# Patient Record
Sex: Male | Born: 1960
Health system: Southern US, Community
[De-identification: ages and names within clinical notes are randomized; demographics above are authoritative.]

## PROBLEM LIST (undated history)

## (undated) DIAGNOSIS — E785 Hyperlipidemia, unspecified: Secondary | ICD-10-CM

## (undated) DIAGNOSIS — M1990 Unspecified osteoarthritis, unspecified site: Secondary | ICD-10-CM

## (undated) DIAGNOSIS — I2510 Atherosclerotic heart disease of native coronary artery without angina pectoris: Secondary | ICD-10-CM

## (undated) DIAGNOSIS — I10 Essential (primary) hypertension: Secondary | ICD-10-CM

## (undated) DIAGNOSIS — Z95820 Peripheral vascular angioplasty status with implants and grafts: Secondary | ICD-10-CM

## (undated) DIAGNOSIS — I255 Ischemic cardiomyopathy: Secondary | ICD-10-CM

## (undated) HISTORY — PX: KNEE SURGERY: SHX244

## (undated) HISTORY — PX: OTHER SURGICAL HISTORY: SHX169

---

## 2015-04-07 ENCOUNTER — Encounter (HOSPITAL_COMMUNITY)
Admission: EM | Disposition: A | Discharge status: Home / Self Care | Payer: Self-pay | Source: Home / Self Care | Attending: Interventional Cardiology

## 2015-04-07 ENCOUNTER — Emergency Department (HOSPITAL_COMMUNITY): Payer: 59

## 2015-04-07 ENCOUNTER — Encounter (HOSPITAL_COMMUNITY): Payer: Self-pay | Admitting: Emergency Medicine

## 2015-04-07 ENCOUNTER — Inpatient Hospital Stay (HOSPITAL_COMMUNITY)
Admission: EM | Admit: 2015-04-07 | Discharge: 2015-04-10 | DRG: 246 | Disposition: A | Discharge status: Home / Self Care | Payer: 59 | Attending: Interventional Cardiology | Admitting: Interventional Cardiology

## 2015-04-07 DIAGNOSIS — I10 Essential (primary) hypertension: Secondary | ICD-10-CM | POA: Diagnosis present

## 2015-04-07 DIAGNOSIS — I5021 Acute systolic (congestive) heart failure: Secondary | ICD-10-CM | POA: Diagnosis present

## 2015-04-07 DIAGNOSIS — I214 Non-ST elevation (NSTEMI) myocardial infarction: Secondary | ICD-10-CM | POA: Diagnosis present

## 2015-04-07 DIAGNOSIS — I249 Acute ischemic heart disease, unspecified: Secondary | ICD-10-CM | POA: Diagnosis present

## 2015-04-07 DIAGNOSIS — I255 Ischemic cardiomyopathy: Secondary | ICD-10-CM | POA: Diagnosis present

## 2015-04-07 DIAGNOSIS — N289 Disorder of kidney and ureter, unspecified: Secondary | ICD-10-CM | POA: Diagnosis present

## 2015-04-07 DIAGNOSIS — E785 Hyperlipidemia, unspecified: Secondary | ICD-10-CM | POA: Diagnosis present

## 2015-04-07 DIAGNOSIS — I472 Ventricular tachycardia: Secondary | ICD-10-CM | POA: Diagnosis present

## 2015-04-07 DIAGNOSIS — I25110 Atherosclerotic heart disease of native coronary artery with unstable angina pectoris: Secondary | ICD-10-CM | POA: Diagnosis present

## 2015-04-07 DIAGNOSIS — I2510 Atherosclerotic heart disease of native coronary artery without angina pectoris: Secondary | ICD-10-CM | POA: Diagnosis not present

## 2015-04-07 DIAGNOSIS — K219 Gastro-esophageal reflux disease without esophagitis: Secondary | ICD-10-CM | POA: Diagnosis present

## 2015-04-07 DIAGNOSIS — R079 Chest pain, unspecified: Secondary | ICD-10-CM

## 2015-04-07 DIAGNOSIS — Z95820 Peripheral vascular angioplasty status with implants and grafts: Secondary | ICD-10-CM

## 2015-04-07 HISTORY — DX: Essential (primary) hypertension: I10

## 2015-04-07 HISTORY — DX: Hyperlipidemia, unspecified: E78.5

## 2015-04-07 HISTORY — DX: Ischemic cardiomyopathy: I25.5

## 2015-04-07 HISTORY — DX: Peripheral vascular angioplasty status with implants and grafts: Z95.820

## 2015-04-07 HISTORY — DX: Atherosclerotic heart disease of native coronary artery without angina pectoris: I25.10

## 2015-04-07 LAB — PROTIME-INR
INR: 0.95 (ref 0.00–1.49)
PROTHROMBIN TIME: 12.9 s (ref 11.6–15.2)

## 2015-04-07 LAB — I-STAT TROPONIN, ED: Troponin i, poc: 0.1 ng/mL (ref 0.00–0.08)

## 2015-04-07 LAB — CBC
HEMATOCRIT: 54.2 % — AB (ref 39.0–52.0)
HEMOGLOBIN: 18.5 g/dL — AB (ref 13.0–17.0)
MCH: 32.1 pg (ref 26.0–34.0)
MCHC: 34.1 g/dL (ref 30.0–36.0)
MCV: 93.9 fL (ref 78.0–100.0)
Platelets: 231 10*3/uL (ref 150–400)
RBC: 5.77 MIL/uL (ref 4.22–5.81)
RDW: 13.4 % (ref 11.5–15.5)
WBC: 15.6 10*3/uL — AB (ref 4.0–10.5)

## 2015-04-07 LAB — BASIC METABOLIC PANEL
Anion gap: 10 (ref 5–15)
BUN: 25 mg/dL — ABNORMAL HIGH (ref 6–20)
CHLORIDE: 104 mmol/L (ref 101–111)
CO2: 26 mmol/L (ref 22–32)
CREATININE: 1.64 mg/dL — AB (ref 0.61–1.24)
Calcium: 9.3 mg/dL (ref 8.9–10.3)
GFR calc non Af Amer: 45 mL/min — ABNORMAL LOW (ref >60)
GFR, EST AFRICAN AMERICAN: 52 mL/min — AB (ref >60)
Glucose, Bld: 104 mg/dL — ABNORMAL HIGH (ref 65–99)
POTASSIUM: 4.2 mmol/L (ref 3.5–5.1)
SODIUM: 140 mmol/L (ref 135–145)

## 2015-04-07 LAB — TROPONIN I: TROPONIN I: 0.12 ng/mL — AB (ref <0.031)

## 2015-04-07 SURGERY — INVASIVE LAB ABORTED CASE

## 2015-04-07 MED ORDER — ONDANSETRON HCL 4 MG/2ML IJ SOLN: 4.0000 mg | Freq: Four times a day (QID) | INTRAMUSCULAR | Status: DC | PRN

## 2015-04-07 MED ORDER — HEPARIN SODIUM (PORCINE) 5000 UNIT/ML IJ SOLN
INTRAMUSCULAR | Status: AC
  Administered 2015-04-07: 4000 [IU]
  Filled 2015-04-07: qty 1

## 2015-04-07 MED ORDER — SODIUM CHLORIDE 0.9 % IJ SOLN
3.0000 mL | Freq: Two times a day (BID) | INTRAMUSCULAR | Status: DC
  Administered 2015-04-07 – 2015-04-08 (×2): 3 mL via INTRAVENOUS

## 2015-04-07 MED ORDER — SODIUM CHLORIDE 0.9 % WEIGHT BASED INFUSION
1.0000 mL/kg/h | INTRAVENOUS | Status: DC
  Administered 2015-04-07 – 2015-04-08 (×2): 1 mL/kg/h via INTRAVENOUS

## 2015-04-07 MED ORDER — ACETAMINOPHEN 325 MG PO TABS
650.0000 mg | ORAL_TABLET | ORAL | Status: DC | PRN
  Administered 2015-04-07 – 2015-04-08 (×2): 650 mg via ORAL
  Filled 2015-04-07 (×2): qty 2

## 2015-04-07 MED ORDER — ASPIRIN EC 81 MG PO TBEC
81.0000 mg | DELAYED_RELEASE_TABLET | Freq: Every day | ORAL | Status: DC
  Administered 2015-04-08: 81 mg via ORAL
  Filled 2015-04-07: qty 1

## 2015-04-07 MED ORDER — SODIUM CHLORIDE 0.9 % IJ SOLN: 3.0000 mL | INTRAMUSCULAR | Status: DC | PRN

## 2015-04-07 MED ORDER — HEPARIN (PORCINE) IN NACL 100-0.45 UNIT/ML-% IJ SOLN
1600.0000 [IU]/h | INTRAMUSCULAR | Status: DC
  Administered 2015-04-07: 1250 [IU]/h via INTRAVENOUS
  Filled 2015-04-07: qty 250

## 2015-04-07 MED ORDER — ASPIRIN 300 MG RE SUPP: 300.0000 mg | RECTAL | Status: DC

## 2015-04-07 MED ORDER — METOPROLOL TARTRATE 25 MG PO TABS
25.0000 mg | ORAL_TABLET | Freq: Two times a day (BID) | ORAL | Status: DC
  Administered 2015-04-07 – 2015-04-08 (×2): 25 mg via ORAL
  Filled 2015-04-07 (×2): qty 1

## 2015-04-07 MED ORDER — ASPIRIN 81 MG PO CHEW: 324.0000 mg | CHEWABLE_TABLET | ORAL | Status: DC

## 2015-04-07 MED ORDER — ATORVASTATIN CALCIUM 80 MG PO TABS: 80.0000 mg | ORAL_TABLET | Freq: Every day | ORAL | Status: DC

## 2015-04-07 MED ORDER — CLOPIDOGREL BISULFATE 75 MG PO TABS
75.0000 mg | ORAL_TABLET | Freq: Every day | ORAL | Status: DC
  Administered 2015-04-08: 75 mg via ORAL
  Filled 2015-04-07: qty 1

## 2015-04-07 MED ORDER — SODIUM CHLORIDE 0.9 % IV SOLN: 250.0000 mL | INTRAVENOUS | Status: DC | PRN

## 2015-04-07 MED ORDER — NITROGLYCERIN 0.4 MG SL SUBL: 0.4000 mg | SUBLINGUAL_TABLET | SUBLINGUAL | Status: DC | PRN

## 2015-04-07 MED ORDER — NITROGLYCERIN IN D5W 200-5 MCG/ML-% IV SOLN
10.0000 ug/min | INTRAVENOUS | Status: DC
  Administered 2015-04-07: 10 ug/min via INTRAVENOUS
  Administered 2015-04-08: 25 ug/min via INTRAVENOUS
  Filled 2015-04-07 (×2): qty 250

## 2015-04-07 MED ORDER — CLOPIDOGREL BISULFATE 75 MG PO TABS
300.0000 mg | ORAL_TABLET | Freq: Once | ORAL | Status: AC
  Administered 2015-04-07: 300 mg via ORAL
  Filled 2015-04-07: qty 4

## 2015-04-07 MED ORDER — ASPIRIN 81 MG PO CHEW
81.0000 mg | CHEWABLE_TABLET | ORAL | Status: AC
  Administered 2015-04-08: 81 mg via ORAL
  Filled 2015-04-07: qty 1

## 2015-04-07 SURGICAL SUPPLY — 12 items
CATH INFINITI 5 FR JL3.5 (CATHETERS) ×3 IMPLANT
CATH INFINITI JR4 5F (CATHETERS) ×3 IMPLANT
CATH SITESEER 5F MULTI A 2 (CATHETERS) IMPLANT
DEVICE RAD COMP TR BAND LRG (VASCULAR PRODUCTS) ×3 IMPLANT
GLIDESHEATH SLEND A-KIT 6F 22G (SHEATH) ×3 IMPLANT
KIT HEART LEFT (KITS) ×3 IMPLANT
PACK CARDIAC CATHETERIZATION (CUSTOM PROCEDURE TRAY) ×3 IMPLANT
SHEATH PINNACLE 5F 10CM (SHEATH) IMPLANT
TRANSDUCER W/STOPCOCK (MISCELLANEOUS) ×3 IMPLANT
TUBING CIL FLEX 10 FLL-RA (TUBING) ×3 IMPLANT
WIRE EMERALD 3MM-J .035X150CM (WIRE) IMPLANT
WIRE SAFE-T 1.5MM-J .035X260CM (WIRE) ×3 IMPLANT

## 2015-04-07 NOTE — Progress Notes (Signed)
04/07/15 1930  Clinical Encounter Type  Visited With Family;Patient not available;Health care provider  Visit Type Initial;Spiritual support;ED  Referral From Nurse  Spiritual Encounters  Spiritual Needs Prayer;Emotional  Stress Factors  Patient Stress Factors None identified  Family Stress Factors Family relationships;Other (Comment) (Code Stemi)  Advance Directives (For Healthcare)  Does patient have an advance directive? No  Chaplain responded to a Code-Stemi and met pt Sister Freda Munro) and Pt Wife (Tammy). Offered hospitality and comfort support while pt was being evaluated. Escorted family to pt side when approved by the nurses and after Code-Stemi was cancelled.   Chaplain is available for further support if desired.  [employer], Chaplain

## 2015-04-07 NOTE — H&P (Addendum)
David Chang is a 54 y.o. male  Admit Date: 04/07/2015 Referring Physician: [facility] Primary care: Maryella Shivers M.D. [place] Primary Cardiologist: Vic Blackbird, M.D. 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 The intensity of the discomfort was graded as a 3/10. The discomfort started after he began taking a shower at home. The discomfort increased in intensity and radiated to the left arm. Total duration of the discomfort was 30 minutes or less starting around 6:10 PM and resolving by 6:40 PM. EMS was summoned. Initial transmitted EKG revealed diagnostic inferior ST elevation with reciprocal ST depression in 1 and aVL. Upon arrival in the emergency room nearly one hour later the patient was completely pain free. He denied dyspnea, diaphoresis, nausea, vomiting, and apprehension. He was jovial and able to carry on a very engaging conversation.  There is no prior history of smoking. He may drink 12 cans of beer over the weekend. He is an avid football fan. His exercise regimen is quite vigorous with 30 minutes of cardio and 1.5 hours of isometric activity 3-4 times per week. Today's workout was no different than usual. He did spend nearly 20 minutes in a steam room and was sweating profusely during this time.    PMH:    Past Medical History  Diagnosis Date  . Hypertension     PSH:    Past Surgical History  Procedure Laterality Date  . Knee surgery Left    ALLERGIES:   Review of patient's allergies indicates no known allergies. Prior to Admit Meds:   (Not in a hospital admission) Family HX:    Family History  Problem Relation Age of Onset  . CVA Other    Social HX:    Social History   Social History  . Marital Status: N/A    Spouse  Name: N/A  . Number of Children: N/A  . Years of Education: N/A   Occupational History  . Not on file.   Social History Main Topics  . Smoking status: Never Smoker   . Smokeless tobacco: Not on file  . Alcohol Use: Yes  . Drug Use: No  . Sexual Activity: Yes   Other Topics Concern  . Not on file   Social History Narrative  . No narrative on file     Review of Systems  Constitutional: Negative.   HENT: Negative.   Eyes: Negative.   Gastrointestinal: Negative.   Genitourinary: Negative.   Musculoskeletal: Positive for joint pain.  Skin: Negative.   Neurological: Negative.   Endo/Heme/Allergies: Negative.   Psychiatric/Behavioral: Negative.     Physical Exam: Blood pressure 168/98, pulse 98, temperature 98.7 F (37.1 C), temperature source Oral, resp. rate 14, height 6' (1.829 m), weight 104.327 kg (230 lb), SpO2 93 %.    Significant tanning of the skin. Patient is in no distress. Respirations are nonlabored. HEENT exam reveals pupils are equal and reactive to light. No jaundice is noted. Neck exam reveals no JVD or carotid bruits. Lungs are clear to auscultation and percussion. Cardiac exam reveals no gallop, rub, murmur, or click. Abdomen soft. Bowel sounds are normal. No bruits are heard. Aorta is not pulsatile. Extremities reveal no edema. Dorsalis pedis, posterior tibial, radial pulses, and carotid pulses all 2+ and symmetric bilaterally Neurological exam reveals no focal  deficits or cognitive impairment.  Labs: Pending    Radiology:  Pending  EKG:   #1 by EMS reveals 1 mm ST elevation 2, 3, and aVF. Minimal ST depression in aVL. Otherwise unremarkable. (Transmitted EKG from [place]). #2 obtained upon arrival reveals non-diagnostic inferior ST elevation improved from prior. Sinus rhythm and prominent voltage was noted.  ASSESSMENT:  1. Presentation is compatible with acute coronary syndrome. He is currently pain-free and but has mild EKG evidence of  ongoing ischemia. Total duration of pain less than 30 minutes, assuming accurate and truthful history. Initially refuses cath.  2. History of hypertension  3. Lipid status and glycemic status are unknown  4. Prior neurological event 7 years ago associated with transient left sided weakness.  Plan:  1. PA and lateral chest x-ray 2. IV heparin and load with Plavix 3. Aspirin 4. Beta blocker therapy 5. IV nitroglycerin 6. The patient was counseled to undergo left heart catheterization, coronary angiography, and possible percutaneous coronary intervention with stent implantation. The procedural risks and benefits were discussed in detail. The risks discussed included death, stroke, myocardial infarction, life-threatening bleeding, limb ischemia, kidney injury, allergy, and possible emergency cardiac surgery. The risk of these complications is less than 1%.   Initially, the patient refused coronary angiography, as he was doubtful there was a problem.  After discussion and 30 minutes later after the arrival of his wife, he agreed to proceed. We will plan on performing the procedure in a.m. unless recurrent symptoms this evening.  7. The patient is cautioned to notify the nursing service if any recurrence of discomfort in his chest.  Sinclair Grooms 04/07/2015 8:02 PM

## 2015-04-07 NOTE — Progress Notes (Signed)
ANTICOAGULATION CONSULT NOTE - Initial Consult  Pharmacy Consult for heparin Indication: chest pain/ACS  No Known Allergies  Patient Measurements: Height: 6' (182.9 cm) Weight: 230 lb (104.327 kg) IBW/kg (Calculated) : 77.6 Heparin Dosing Weight: 99.2 kg  Vital Signs: Temp: 98.7 F (37.1 C) (11/16 1936) Temp Source: Oral (11/16 1936) BP: 168/98 mmHg (11/16 1936) Pulse Rate: 98 (11/16 1936)  Labs:  Recent Labs  04/07/15 1925  HGB 18.5*  HCT 54.2*  PLT 231    CrCl cannot be calculated (Patient has no serum creatinine result on file.).   Medical History: Past Medical History  Diagnosis Date  . Hypertension     Assessment: 54 yo m presenting to the ED on 11/16 with CP. Pharmacy is consulted to dose heparin. Patient has already received a 4,000 unit bolus. Hgb 18.5, plts 231. He is not on any anticoagulation at home.   Goal of Therapy:  Heparin level 0.3-0.7 units/ml Monitor platelets by anticoagulation protocol: Yes   Plan:  Heparin infusion 1250 units/hr 6-hr HL @ 0230 Daily HL, CBC Monitor s/sx of bleeding  Cassie L. Nicole Kindred, PharmD Clinical Pharmacy Resident Pager: [phone] 04/07/2015 8:23 PM

## 2015-04-07 NOTE — ED Notes (Signed)
Pt. arrived with EMS [place])  from an urgent care reports central chest discomfort radiating to left arm onset this evening approx. 6 pm , received ASA 325 mg and 4 NTG sl by EMS with mild relief.

## 2015-04-08 ENCOUNTER — Inpatient Hospital Stay (HOSPITAL_COMMUNITY): Payer: 59

## 2015-04-08 ENCOUNTER — Encounter (HOSPITAL_COMMUNITY)
Admission: EM | Disposition: A | Discharge status: Home / Self Care | Payer: Self-pay | Source: Home / Self Care | Attending: Interventional Cardiology

## 2015-04-08 ENCOUNTER — Encounter (HOSPITAL_COMMUNITY): Payer: Self-pay | Admitting: Certified Registered Nurse Anesthetist

## 2015-04-08 DIAGNOSIS — I25110 Atherosclerotic heart disease of native coronary artery with unstable angina pectoris: Secondary | ICD-10-CM | POA: Diagnosis present

## 2015-04-08 DIAGNOSIS — I2510 Atherosclerotic heart disease of native coronary artery without angina pectoris: Secondary | ICD-10-CM

## 2015-04-08 DIAGNOSIS — I214 Non-ST elevation (NSTEMI) myocardial infarction: Secondary | ICD-10-CM | POA: Diagnosis present

## 2015-04-08 HISTORY — PX: CARDIAC CATHETERIZATION: SHX172

## 2015-04-08 HISTORY — DX: Atherosclerotic heart disease of native coronary artery without angina pectoris: I25.10

## 2015-04-08 HISTORY — PX: CORONARY STENT INTERVENTION: CATH118234

## 2015-04-08 LAB — LIPID PANEL
CHOL/HDL RATIO: 10.9 ratio
CHOLESTEROL: 251 mg/dL — AB (ref 0–200)
HDL: 23 mg/dL — ABNORMAL LOW (ref >40)
LDL Cholesterol: 203 mg/dL — ABNORMAL HIGH (ref 0–99)
Triglycerides: 124 mg/dL (ref <150)
VLDL: 25 mg/dL (ref 0–40)

## 2015-04-08 LAB — COMPREHENSIVE METABOLIC PANEL
ALBUMIN: 3.1 g/dL — AB (ref 3.5–5.0)
ALK PHOS: 30 U/L — AB (ref 38–126)
ALT: 45 U/L (ref 17–63)
ANION GAP: 9 (ref 5–15)
AST: 133 U/L — ABNORMAL HIGH (ref 15–41)
BUN: 23 mg/dL — ABNORMAL HIGH (ref 6–20)
CALCIUM: 8.8 mg/dL — AB (ref 8.9–10.3)
CO2: 24 mmol/L (ref 22–32)
Chloride: 104 mmol/L (ref 101–111)
Creatinine, Ser: 1.44 mg/dL — ABNORMAL HIGH (ref 0.61–1.24)
GFR calc Af Amer: >60 mL/min (ref >60)
GFR calc non Af Amer: 53 mL/min — ABNORMAL LOW (ref >60)
GLUCOSE: 154 mg/dL — AB (ref 65–99)
Potassium: 3.8 mmol/L (ref 3.5–5.1)
SODIUM: 137 mmol/L (ref 135–145)
Total Bilirubin: 0.6 mg/dL (ref 0.3–1.2)
Total Protein: 5.8 g/dL — ABNORMAL LOW (ref 6.5–8.1)

## 2015-04-08 LAB — MRSA PCR SCREENING: MRSA BY PCR: NEGATIVE

## 2015-04-08 LAB — CBC WITH DIFFERENTIAL/PLATELET
BASOS ABS: 0 10*3/uL (ref 0.0–0.1)
BASOS PCT: 0 %
Eosinophils Absolute: 0.2 10*3/uL (ref 0.0–0.7)
Eosinophils Relative: 1 %
HEMATOCRIT: 50 % (ref 39.0–52.0)
HEMOGLOBIN: 17.2 g/dL — AB (ref 13.0–17.0)
Lymphocytes Relative: 8 %
Lymphs Abs: 1.1 10*3/uL (ref 0.7–4.0)
MCH: 32 pg (ref 26.0–34.0)
MCHC: 34.4 g/dL (ref 30.0–36.0)
MCV: 92.9 fL (ref 78.0–100.0)
MONOS PCT: 6 %
Monocytes Absolute: 0.8 10*3/uL (ref 0.1–1.0)
NEUTROS ABS: 11.6 10*3/uL — AB (ref 1.7–7.7)
NEUTROS PCT: 85 %
Platelets: 204 10*3/uL (ref 150–400)
RBC: 5.38 MIL/uL (ref 4.22–5.81)
RDW: 13.3 % (ref 11.5–15.5)
WBC: 13.7 10*3/uL — ABNORMAL HIGH (ref 4.0–10.5)

## 2015-04-08 LAB — HEPARIN LEVEL (UNFRACTIONATED)
HEPARIN UNFRACTIONATED: 0.13 [IU]/mL — AB (ref 0.30–0.70)
Heparin Unfractionated: 0.4 IU/mL (ref 0.30–0.70)

## 2015-04-08 LAB — POCT ACTIVATED CLOTTING TIME: Activated Clotting Time: 417 seconds

## 2015-04-08 LAB — BASIC METABOLIC PANEL
ANION GAP: 7 (ref 5–15)
BUN: 23 mg/dL — AB (ref 6–20)
CO2: 25 mmol/L (ref 22–32)
CREATININE: 1.4 mg/dL — AB (ref 0.61–1.24)
Calcium: 8.3 mg/dL — ABNORMAL LOW (ref 8.9–10.3)
Chloride: 107 mmol/L (ref 101–111)
GFR, EST NON AFRICAN AMERICAN: 55 mL/min — AB (ref >60)
GLUCOSE: 101 mg/dL — AB (ref 65–99)
POTASSIUM: 3.7 mmol/L (ref 3.5–5.1)
SODIUM: 139 mmol/L (ref 135–145)

## 2015-04-08 LAB — CBC
HCT: 46.6 % (ref 39.0–52.0)
HEMOGLOBIN: 16.1 g/dL (ref 13.0–17.0)
MCH: 32.1 pg (ref 26.0–34.0)
MCHC: 34.5 g/dL (ref 30.0–36.0)
MCV: 92.8 fL (ref 78.0–100.0)
PLATELETS: 188 10*3/uL (ref 150–400)
RBC: 5.02 MIL/uL (ref 4.22–5.81)
RDW: 13.6 % (ref 11.5–15.5)
WBC: 11.5 10*3/uL — ABNORMAL HIGH (ref 4.0–10.5)

## 2015-04-08 LAB — TROPONIN I
TROPONIN I: 36.83 ng/mL — AB (ref <0.031)
TROPONIN I: 29.45 ng/mL — AB (ref <0.031)
Troponin I: 18.45 ng/mL (ref <0.031)

## 2015-04-08 LAB — BRAIN NATRIURETIC PEPTIDE: B Natriuretic Peptide: 68.4 pg/mL (ref 0.0–100.0)

## 2015-04-08 LAB — TSH: TSH: 2.732 u[IU]/mL (ref 0.350–4.500)

## 2015-04-08 SURGERY — LEFT HEART CATH AND CORONARY ANGIOGRAPHY

## 2015-04-08 MED ORDER — PANTOPRAZOLE SODIUM 40 MG PO TBEC
40.0000 mg | DELAYED_RELEASE_TABLET | Freq: Every day | ORAL | Status: DC
  Administered 2015-04-08 – 2015-04-10 (×3): 40 mg via ORAL
  Filled 2015-04-08 (×3): qty 1

## 2015-04-08 MED ORDER — SODIUM CHLORIDE 0.9 % WEIGHT BASED INFUSION
3.0000 mL/kg/h | INTRAVENOUS | Status: AC
  Administered 2015-04-08: 3 mL/kg/h via INTRAVENOUS

## 2015-04-08 MED ORDER — SODIUM CHLORIDE 0.9 % IJ SOLN
3.0000 mL | Freq: Two times a day (BID) | INTRAMUSCULAR | Status: DC
  Administered 2015-04-08 – 2015-04-10 (×3): 3 mL via INTRAVENOUS

## 2015-04-08 MED ORDER — MIDAZOLAM HCL 2 MG/2ML IJ SOLN
INTRAMUSCULAR | Status: AC
  Filled 2015-04-08: qty 2

## 2015-04-08 MED ORDER — BIVALIRUDIN BOLUS VIA INFUSION - CUPID
INTRAVENOUS | Status: DC | PRN
  Administered 2015-04-08: 78.675 mg via INTRAVENOUS

## 2015-04-08 MED ORDER — FENTANYL CITRATE (PF) 100 MCG/2ML IJ SOLN
INTRAMUSCULAR | Status: DC | PRN
  Administered 2015-04-08: 25 ug via INTRAVENOUS
  Administered 2015-04-08: 50 ug via INTRAVENOUS

## 2015-04-08 MED ORDER — NITROGLYCERIN 1 MG/10 ML FOR IR/CATH LAB
INTRA_ARTERIAL | Status: DC | PRN
  Administered 2015-04-08: 13:00:00

## 2015-04-08 MED ORDER — ASPIRIN 81 MG PO CHEW
81.0000 mg | CHEWABLE_TABLET | Freq: Every day | ORAL | Status: DC
  Administered 2015-04-09 – 2015-04-10 (×2): 81 mg via ORAL
  Filled 2015-04-08 (×2): qty 1

## 2015-04-08 MED ORDER — HEPARIN SODIUM (PORCINE) 1000 UNIT/ML IJ SOLN
INTRAMUSCULAR | Status: DC | PRN
  Administered 2015-04-08: 5000 [IU] via INTRAVENOUS

## 2015-04-08 MED ORDER — HEPARIN (PORCINE) IN NACL 2-0.9 UNIT/ML-% IJ SOLN
INTRAMUSCULAR | Status: AC
  Filled 2015-04-08: qty 1500

## 2015-04-08 MED ORDER — IOHEXOL 350 MG/ML SOLN
INTRAVENOUS | Status: DC | PRN
  Administered 2015-04-08: 270 mL via INTRA_ARTERIAL

## 2015-04-08 MED ORDER — CLOPIDOGREL BISULFATE 300 MG PO TABS
ORAL_TABLET | ORAL | Status: DC | PRN
  Administered 2015-04-08: 300 mg via ORAL

## 2015-04-08 MED ORDER — CLOPIDOGREL BISULFATE 75 MG PO TABS
75.0000 mg | ORAL_TABLET | Freq: Every day | ORAL | Status: DC
  Administered 2015-04-09 – 2015-04-10 (×2): 75 mg via ORAL
  Filled 2015-04-08 (×2): qty 1

## 2015-04-08 MED ORDER — MIDAZOLAM HCL 2 MG/2ML IJ SOLN
INTRAMUSCULAR | Status: DC | PRN
  Administered 2015-04-08: 2 mg via INTRAVENOUS
  Administered 2015-04-08: 1 mg via INTRAVENOUS

## 2015-04-08 MED ORDER — CLOPIDOGREL BISULFATE 300 MG PO TABS
ORAL_TABLET | ORAL | Status: AC
Start: 2015-04-08 — End: 2015-04-08
  Filled 2015-04-08: qty 1

## 2015-04-08 MED ORDER — AMLODIPINE BESYLATE 5 MG PO TABS
5.0000 mg | ORAL_TABLET | Freq: Every day | ORAL | Status: DC
  Administered 2015-04-08 – 2015-04-09 (×2): 5 mg via ORAL
  Filled 2015-04-08 (×2): qty 1

## 2015-04-08 MED ORDER — SODIUM CHLORIDE 0.9 % IV SOLN: 250.0000 mL | INTRAVENOUS | Status: DC | PRN

## 2015-04-08 MED ORDER — LOSARTAN POTASSIUM 25 MG PO TABS
25.0000 mg | ORAL_TABLET | Freq: Every day | ORAL | Status: DC
  Administered 2015-04-08 – 2015-04-09 (×2): 25 mg via ORAL
  Filled 2015-04-08 (×3): qty 1

## 2015-04-08 MED ORDER — BIVALIRUDIN 250 MG IV SOLR
INTRAVENOUS | Status: AC
  Filled 2015-04-08: qty 250

## 2015-04-08 MED ORDER — VERAPAMIL HCL 2.5 MG/ML IV SOLN
INTRA_ARTERIAL | Status: DC | PRN
  Administered 2015-04-08: 11:00:00 via INTRA_ARTERIAL

## 2015-04-08 MED ORDER — HEPARIN BOLUS VIA INFUSION
3000.0000 [IU] | Freq: Once | INTRAVENOUS | Status: AC
  Administered 2015-04-08: 3000 [IU] via INTRAVENOUS
  Filled 2015-04-08: qty 3000

## 2015-04-08 MED ORDER — HEPARIN SODIUM (PORCINE) 1000 UNIT/ML IJ SOLN
INTRAMUSCULAR | Status: AC
  Filled 2015-04-08: qty 1

## 2015-04-08 MED ORDER — SODIUM CHLORIDE 0.9 % IJ SOLN: 3.0000 mL | INTRAMUSCULAR | Status: DC | PRN

## 2015-04-08 MED ORDER — ZOLPIDEM TARTRATE 5 MG PO TABS
5.0000 mg | ORAL_TABLET | Freq: Once | ORAL | Status: AC
  Administered 2015-04-08: 5 mg via ORAL
  Filled 2015-04-08: qty 1

## 2015-04-08 MED ORDER — NITROGLYCERIN 1 MG/10 ML FOR IR/CATH LAB
INTRA_ARTERIAL | Status: AC
  Filled 2015-04-08: qty 10

## 2015-04-08 MED ORDER — FENTANYL CITRATE (PF) 100 MCG/2ML IJ SOLN
INTRAMUSCULAR | Status: AC
  Filled 2015-04-08: qty 2

## 2015-04-08 MED ORDER — SODIUM CHLORIDE 0.9 % IV SOLN
250.0000 mg | INTRAVENOUS | Status: DC | PRN
  Administered 2015-04-08: 1.75 mg/kg/h via INTRAVENOUS

## 2015-04-08 MED ORDER — ONDANSETRON HCL 4 MG/2ML IJ SOLN: 4.0000 mg | Freq: Four times a day (QID) | INTRAMUSCULAR | Status: DC | PRN

## 2015-04-08 MED ORDER — NEBIVOLOL HCL 10 MG PO TABS
10.0000 mg | ORAL_TABLET | Freq: Once | ORAL | Status: AC
  Administered 2015-04-08: 10 mg via ORAL
  Filled 2015-04-08: qty 1

## 2015-04-08 MED ORDER — NEBIVOLOL HCL 10 MG PO TABS
20.0000 mg | ORAL_TABLET | Freq: Every day | ORAL | Status: DC
  Administered 2015-04-09 – 2015-04-10 (×2): 20 mg via ORAL
  Filled 2015-04-08 (×4): qty 2

## 2015-04-08 MED ORDER — ACETAMINOPHEN 325 MG PO TABS
650.0000 mg | ORAL_TABLET | ORAL | Status: DC | PRN
  Administered 2015-04-08 – 2015-04-09 (×3): 650 mg via ORAL
  Filled 2015-04-08 (×3): qty 2

## 2015-04-08 MED ORDER — NEBIVOLOL HCL 10 MG PO TABS
10.0000 mg | ORAL_TABLET | Freq: Every day | ORAL | Status: DC
  Administered 2015-04-08: 10 mg via ORAL
  Filled 2015-04-08: qty 1

## 2015-04-08 MED ORDER — VERAPAMIL HCL 2.5 MG/ML IV SOLN
INTRAVENOUS | Status: AC
  Filled 2015-04-08: qty 2

## 2015-04-08 MED ORDER — ATORVASTATIN CALCIUM 80 MG PO TABS
80.0000 mg | ORAL_TABLET | Freq: Every day | ORAL | Status: DC
  Administered 2015-04-08 – 2015-04-09 (×2): 80 mg via ORAL
  Filled 2015-04-08 (×2): qty 1

## 2015-04-08 MED ORDER — LIDOCAINE HCL (PF) 1 % IJ SOLN
INTRAMUSCULAR | Status: AC
Start: 2015-04-08 — End: 2015-04-08
  Filled 2015-04-08: qty 30

## 2015-04-08 SURGICAL SUPPLY — 25 items
BALLN EMERGE MR 2.5X12 (BALLOONS) ×2
BALLN ~~LOC~~ EMERGE MR 3.25X12 (BALLOONS) ×2
BALLN ~~LOC~~ EMERGE MR 4.5X15 (BALLOONS) ×2
BALLOON EMERGE MR 2.5X12 (BALLOONS) ×1 IMPLANT
BALLOON ~~LOC~~ EMERGE MR 3.25X12 (BALLOONS) ×1 IMPLANT
BALLOON ~~LOC~~ EMERGE MR 4.5X15 (BALLOONS) ×1 IMPLANT
CATH HEARTRAIL IKARI 6F IR1.0 (CATHETERS) ×2 IMPLANT
CATH INFINITI 5FR ANG PIGTAIL (CATHETERS) ×2 IMPLANT
CATH INFINITI JR4 5F (CATHETERS) ×2 IMPLANT
CATH OPTITORQUE TIG 4.0 5F (CATHETERS) ×2 IMPLANT
CATH VISTA GUIDE 6FR XBLAD3.5 (CATHETERS) ×2 IMPLANT
DEVICE RAD COMP TR BAND LRG (VASCULAR PRODUCTS) ×4 IMPLANT
GLIDESHEATH SLEND A-KIT 6F 22G (SHEATH) ×2 IMPLANT
KIT ENCORE 26 ADVANTAGE (KITS) ×2 IMPLANT
KIT HEART LEFT (KITS) ×2 IMPLANT
PACK CARDIAC CATHETERIZATION (CUSTOM PROCEDURE TRAY) ×2 IMPLANT
STENT PROMUS PREM MR 3.0X16 (Permanent Stent) ×2 IMPLANT
STENT PROMUS PREM MR 3.5X16 (Permanent Stent) ×2 IMPLANT
STENT PROMUS PREM MR 4.0X20 (Permanent Stent) ×2 IMPLANT
SYR MEDRAD MARK V 150ML (SYRINGE) ×2 IMPLANT
TRANSDUCER W/STOPCOCK (MISCELLANEOUS) ×2 IMPLANT
TUBING CIL FLEX 10 FLL-RA (TUBING) ×2 IMPLANT
WIRE HI TORQ BMW 190CM (WIRE) ×4 IMPLANT
WIRE LUGE 182CM (WIRE) ×2 IMPLANT
WIRE SAFE-T 1.5MM-J .035X260CM (WIRE) ×2 IMPLANT

## 2015-04-08 NOTE — Progress Notes (Signed)
CRITICAL VALUE ALERT  Critical value received:  Troponin 18.45  Date of notification:  04/08/15  Time of notification:  0138  Critical value read back: YES   Nurse who received alert:  Sherryl Manges, RN, BSN  MD notified (1st page): [place]  Time of first page:  0151  MD notified (2nd page):   Time of second page:   Responding MD:  Aundra Dubin  Time MD responded:  [phone]

## 2015-04-08 NOTE — Care Management Note (Addendum)
Case Management Note  Patient Details  Name: Isaya Carias MRN: [id number] Date of Birth: 1961-03-18  Subjective/Objective:       Adm w mi             Action/Plan:lives w fam   Expected Discharge Date:                  Expected Discharge Plan:     In-House Referral:     Discharge planning Services     Post Acute Care Choice:    Choice offered to:     DME Arranged:    DME Agency:     HH Arranged:    [place] Agency:     Status of Service:     Medicare Important Message Given:    Date Medicare IM Given:    Medicare IM give by:    Date Additional Medicare IM Given:    Additional Medicare Important Message give by:     If discussed at [place] of Stay Meetings, dates discussed:    Additional Comments: ur review done  Lacretia Leigh, RN 04/08/2015, 3:33 PM

## 2015-04-08 NOTE — Progress Notes (Signed)
ANTICOAGULATION CONSULT NOTE - Follow-up Consult  Pharmacy Consult for heparin Indication: chest pain/ACS  No Known Allergies  Patient Measurements: Height: 6\' 1"  (185.4 cm) Weight: 231 lb 4.2 oz (104.9 kg) IBW/kg (Calculated) : 79.9 Heparin Dosing Weight: 99.2 kg  Vital Signs: Temp: 98 F (36.7 C) (11/17 0100) Temp Source: Oral (11/17 0100) BP: 168/98 mmHg (11/17 0100) Pulse Rate: 87 (11/17 0100)  Labs:  Recent Labs  04/07/15 1925 04/08/15 0028 04/08/15 0227  HGB 18.5* 17.2*  --   HCT 54.2* 50.0  --   PLT 231 204  --   LABPROT 12.9  --   --   INR 0.95  --   --   HEPARINUNFRC  --   --  0.13*  CREATININE 1.64* 1.44*  --   TROPONINI 0.12* 18.45*  --     Estimated Creatinine Clearance: 72.8 mL/min (by C-G formula based on Cr of 1.44).   Assessment: 54 yo M on heparin for NSTEMI. Troponin up to 18.45. Heparin level subtherapeutic (0.13) on gtt at 1250 units/hr. No issues with line or bleeding reported per RN. Plan for cath today.  Goal of Therapy:  Heparin level 0.3-0.7 units/ml Monitor platelets by anticoagulation protocol: Yes   Plan:  Rebolus heparin 3000 units Increase heparin infusion to 1600 units/hr F/u 6 hr HL  Sherlon Handing, PharmD, BCPS Clinical pharmacist, pager [phone] 04/08/2015 3:26 AM

## 2015-04-08 NOTE — Research (Signed)
[place] presented to patient/family. Question encouraged and answered. Patient desires to review ICF in more detail with wife. Research team will follow up in morning.

## 2015-04-08 NOTE — Progress Notes (Signed)
On assessment of second TR band after deflation, noted more swelling in arm and hand. Pressure applied. Pt states fingers were tingling. Pulses +2 in R radial. Cath lab staff notified and Dr. Ellyn Hack notified.

## 2015-04-08 NOTE — Progress Notes (Signed)
  Echocardiogram 2D Echocardiogram has been performed.  David Chang 04/08/2015, 3:40 PM

## 2015-04-08 NOTE — Interval H&P Note (Signed)
History and Physical Interval Note:  04/08/2015 10:14 AM  David Chang  has presented today for surgery, with the diagnosis of NON-STEMI (Aborted STEMI).   The various methods of treatment have been discussed with the patient and family. After consideration of risks, benefits and other options for treatment, the patient has consented to   Procedure(s): Left Heart Cath and Coronary Angiography (N/A) with Possible Percutaneous Coronary Intervention as a surgical intervention .    The patient's history has been reviewed, patient examined, no change in status, stable for surgery.  I have reviewed the patient's chart and labs.  Questions were answered to the patient's satisfaction.    Cath Lab Visit (complete for each Cath Lab visit)  Clinical Evaluation Leading to the Procedure:   ACS: Yes.    Non-ACS:    Anginal Classification: CCS IV  Anti-ischemic medical therapy: Minimal Therapy (1 class of medications)  Non-Invasive Test Results: No non-invasive testing performed  Prior CABG: No previous CABG    TIMI Score  Patient Information:  TIMI Score is 3   UA/NSTEMI and intermediate-risk features (e.g., TIMI score 3-4) for short-term risk of death or nonfatal MI  Revascularization of the presumed culprit artery   A (9)  Indication: 10; Score: 9  David Chang, Leonie Green, M.D., M.S. Interventional Cardiologist   Pager # [phone]           [facility], David Chang

## 2015-04-09 DIAGNOSIS — I214 Non-ST elevation (NSTEMI) myocardial infarction: Principal | ICD-10-CM

## 2015-04-09 DIAGNOSIS — I5023 Acute on chronic systolic (congestive) heart failure: Secondary | ICD-10-CM

## 2015-04-09 DIAGNOSIS — I249 Acute ischemic heart disease, unspecified: Secondary | ICD-10-CM

## 2015-04-09 DIAGNOSIS — I10 Essential (primary) hypertension: Secondary | ICD-10-CM

## 2015-04-09 LAB — CBC
HCT: 47.8 % (ref 39.0–52.0)
HEMOGLOBIN: 16 g/dL (ref 13.0–17.0)
MCH: 31.5 pg (ref 26.0–34.0)
MCHC: 33.5 g/dL (ref 30.0–36.0)
MCV: 94.1 fL (ref 78.0–100.0)
PLATELETS: 176 10*3/uL (ref 150–400)
RBC: 5.08 MIL/uL (ref 4.22–5.81)
RDW: 13.6 % (ref 11.5–15.5)
WBC: 11.5 10*3/uL — ABNORMAL HIGH (ref 4.0–10.5)

## 2015-04-09 LAB — BASIC METABOLIC PANEL
Anion gap: 6 (ref 5–15)
BUN: 15 mg/dL (ref 6–20)
CALCIUM: 7.9 mg/dL — AB (ref 8.9–10.3)
CO2: 27 mmol/L (ref 22–32)
CREATININE: 1.23 mg/dL (ref 0.61–1.24)
Chloride: 104 mmol/L (ref 101–111)
Glucose, Bld: 98 mg/dL (ref 65–99)
Potassium: 3.7 mmol/L (ref 3.5–5.1)
SODIUM: 137 mmol/L (ref 135–145)

## 2015-04-09 LAB — PLATELET INHIBITION P2Y12: PLATELET FUNCTION P2Y12: 153 [PRU] — AB (ref 194–418)

## 2015-04-09 MED ORDER — HYDRALAZINE HCL 20 MG/ML IJ SOLN: 10.0000 mg | INTRAMUSCULAR | Status: DC | PRN

## 2015-04-09 MED ORDER — ZOLPIDEM TARTRATE 5 MG PO TABS
5.0000 mg | ORAL_TABLET | Freq: Once | ORAL | Status: AC
  Administered 2015-04-09: 5 mg via ORAL
  Filled 2015-04-09: qty 1

## 2015-04-09 MED ORDER — FUROSEMIDE 40 MG PO TABS: 40.0000 mg | ORAL_TABLET | Freq: Every day | ORAL | Status: DC

## 2015-04-09 MED ORDER — HYDROCHLOROTHIAZIDE 12.5 MG PO CAPS
12.5000 mg | ORAL_CAPSULE | Freq: Every day | ORAL | Status: DC
  Administered 2015-04-09 – 2015-04-10 (×2): 12.5 mg via ORAL
  Filled 2015-04-09 (×3): qty 1

## 2015-04-09 MED ORDER — FUROSEMIDE 10 MG/ML IJ SOLN
20.0000 mg | Freq: Once | INTRAMUSCULAR | Status: AC
  Administered 2015-04-09: 20 mg via INTRAVENOUS
  Filled 2015-04-09: qty 2

## 2015-04-09 MED ORDER — AMLODIPINE BESYLATE 10 MG PO TABS
10.0000 mg | ORAL_TABLET | Freq: Every day | ORAL | Status: DC
  Administered 2015-04-09: 5 mg via ORAL
  Administered 2015-04-10: 10 mg via ORAL
  Filled 2015-04-09 (×2): qty 1

## 2015-04-09 NOTE — Progress Notes (Signed)
   BP still elevated.  Reviewed images.  Plan to add diuretic, wean NTG, ambulate, and possibly home tomorrow if BP controlled.

## 2015-04-09 NOTE — Progress Notes (Addendum)
Patient Name: David Chang Date of Encounter: 04/09/2015  Principal Problem:   Non-STEMI (non-ST elevated myocardial infarction) [facility]) Active Problems:   Acute coronary syndrome ([place])   Hypertension, essential   Coronary artery disease involving native coronary artery of native heart with unstable angina pectoris ([place])   Length of Stay: 2  SUBJECTIVE  The patient is very sleepy, denies symptoms.  CURRENT MEDS . amLODipine  5 mg Oral Daily  . aspirin  81 mg Oral Daily  . atorvastatin  80 mg Oral q1800  . clopidogrel  75 mg Oral Q breakfast  . furosemide  20 mg Intravenous Once  . hydrochlorothiazide  12.5 mg Oral Daily  . losartan  25 mg Oral Daily  . nebivolol  20 mg Oral Daily  . pantoprazole  40 mg Oral Daily  . sodium chloride  3 mL Intravenous Q12H    OBJECTIVE  Filed Vitals:   04/09/15 0600 04/09/15 0700 04/09/15 0730 04/09/15 0800  BP: 132/88 135/90 141/93 145/106  Pulse: 73 69 79 77  Temp:   98.1 F (36.7 C)   TempSrc:   Oral   Resp: 13 14 16 13   Height:      Weight:      SpO2: 92% 96% 95% 96%    Intake/Output Summary (Last 24 hours) at 04/09/15 1001 Last data filed at 04/09/15 0800  Gross per 24 hour  Intake 548.72 ml  Output    820 ml  Net -271.28 ml   Filed Weights   04/07/15 1900 04/07/15 1936 04/07/15 2254  Weight: 232 lb (105.235 kg) 230 lb (104.327 kg) 231 lb 4.2 oz (104.9 kg)    PHYSICAL EXAM  General: Pleasant, NAD. Neuro: Alert and oriented X 3. Moves all extremities spontaneously. Psych: Normal affect. HEENT:  Normal  Neck: Supple without bruits or JVD. Lungs:  Resp regular and unlabored, crackles at the bases Heart: RRR no s3, s4, or murmurs. Abdomen: Soft, non-tender, non-distended, BS + x 4.  Extremities: No clubbing, cyanosis or edema. DP/PT/Radials 2+ and equal bilaterally.  Accessory Clinical Findings  CBC  Recent Labs  04/08/15 0028 04/08/15 0545 04/09/15 0500  WBC 13.7* 11.5* 11.5*  NEUTROABS 11.6*  --    --   HGB 17.2* 16.1 16.0  HCT 50.0 46.6 47.8  MCV 92.9 92.8 94.1  PLT 204 188 0000000   Basic Metabolic Panel  Recent Labs  04/08/15 0545 04/09/15 0500  NA 139 137  K 3.7 3.7  CL 107 104  CO2 25 27  GLUCOSE 101* 98  BUN 23* 15  CREATININE 1.40* 1.23  CALCIUM 8.3* 7.9*   Liver Function Tests  Recent Labs  04/08/15 0028  AST 133*  ALT 45  ALKPHOS 30*  BILITOT 0.6  PROT 5.8*  ALBUMIN 3.1*    Recent Labs  04/08/15 0028 04/08/15 0545 04/08/15 1220  TROPONINI 18.45* 36.83* 29.45*    Recent Labs  04/08/15 0545  CHOL 251*  HDL 23*  LDLCALC 203*  TRIG 124  CHOLHDL 10.9   Thyroid Function Tests  Recent Labs  04/08/15 0028  TSH 2.732   Radiology/Studies  Dg Chest 1 View  04/07/2015  CLINICAL DATA:  Chest pain today after working out pt had abnormal EKG at urgent care EXAM: CHEST  1 VIEW COMPARISON:  None available FINDINGS: Lungs clear. Heart size upper limits normal for technique. Tortuous thoracic aorta. No pneumothorax. No effusion. Visualized skeletal structures are unremarkable. IMPRESSION: No acute cardiopulmonary disease. Electronically Signed   By: Keturah Barre  Vernard Gambles M.D.   On: 04/07/2015 21:36   Left cardiac cath:04/08/2015  Severe 3 vessel CAD with 3 successful PCI.  Mid LAD lesion, 90% stenosed. PCI with a Promus Premier DES 3.5 mm x 16 mm (3.8 mm). Post intervention, there is a 0% residual stenosis.  Prox RCA lesion, 90% stenosed. PCI with a Promus Premier DES 4.0 mm x 20 mm (4.3 mm). Post intervention, there is a 0% residual stenosis.  Mid Cx lesion, 95% stenosed. PCI with a Promus Premier DES 3.0 mm x 16 mm (3.4 mm). Post intervention, there is a 0% residual stenosis.  Mid RCA lesion, 45% stenosed.  Mildly elevated LVEDP  TELE: SR, ns VT - 13 beats  TTE: 04/08/2015 Procedure narrative: Transthoracic echocardiography. Image quality was poor. The study was technically difficult, as a result of poor acoustic windows, poor sound wave  transmission, restricted patient mobility, and body habitus. - Left ventricle: The cavity size was normal. Wall thickness was increased in a pattern of moderate LVH. Systolic function was mildly reduced. The estimated ejection fraction was in the range of 45% to 50%. Regional wall motion abnormalities cannot be excluded. Doppler parameters are consistent with abnormal left ventricular relaxation (grade 1 diastolic dysfunction). - Aortic valve: There was mild regurgitation. - Mitral valve: There was mild regurgitation. - Left atrium: The atrium was moderately dilated.    ASSESSMENT AND PLAN  54 year old male   1. NSTEMI - s/p cath yesterday with successful PCI to all three vessels Troponin downtrending - continue ASA, plavix atorvastatin, nebivolol, losartan  - start physical therapy today - possible discharge tomorrow  2. LV dysfunction with mild acute systolic CHF - add HCTZ [id number] mg po daily  3. HTN - add HCTZ 12.5 mg po daily  4. Acute renal insufficiency - crea improved 1.4 --> 1.2  5. nsVT - 13 beats on tele, on BB, most probably reperfusion   Anticipated discharge tomorrow.   Signed, Dorothy Spark MD, [facility] 04/09/2015

## 2015-04-09 NOTE — Progress Notes (Signed)
    Patient had elevated blood pressure- called by RN. Increased amlodipine 5mg  to 20mg  and gave PRN hydralazine   Angelena Form PA-C  [facility]

## 2015-04-09 NOTE — Progress Notes (Addendum)
CARDIAC REHAB PHASE I   PRE:  Rate/Rhythm: 84 SR  BP:  Sitting: 159/109        SaO2: 96 RA  MODE:  Ambulation: 700 ft   POST:  Rate/Rhythm: 88 SR  BP:  Sitting: 189/124         SaO2: 98 RA  Pt ambulated 700 ft on RA, IV, indepent, steady gait, tolerated well.  Pt denies cp, dizziness, DOE, declined rest stop. Pt BP remains elevated, RN aware. Completed MI/stent education with pt wife and daughter at bedside.  Reviewed risk factors, anti-platelet therapy, stent card, activity restrictions, ntg, exercise, heart healthy diet, and phase 2 cardiac rehab. Pt verbalized understanding. Pt agrees to phase 2 cardiac rehab referral, will send to [place] per pt request. Pt has many questions regarding activity restrictions specific to exercise and when he can return to work. Pt is very active, exercises regularly, and lifts heavy weights, advised pt to discuss with MD when he can return to work and resume any weight lifting.  Pt receptive to education. Pt to edge of bed after walk, call bell within reach. Will follow.  [phone]   Lenna Sciara, RN, BSN 04/09/2015 11:30 AM

## 2015-04-10 ENCOUNTER — Encounter (HOSPITAL_COMMUNITY): Payer: Self-pay | Admitting: Cardiology

## 2015-04-10 DIAGNOSIS — I255 Ischemic cardiomyopathy: Secondary | ICD-10-CM

## 2015-04-10 DIAGNOSIS — Z95820 Peripheral vascular angioplasty status with implants and grafts: Secondary | ICD-10-CM

## 2015-04-10 DIAGNOSIS — E785 Hyperlipidemia, unspecified: Secondary | ICD-10-CM

## 2015-04-10 HISTORY — DX: Hyperlipidemia, unspecified: E78.5

## 2015-04-10 HISTORY — DX: Ischemic cardiomyopathy: I25.5

## 2015-04-10 HISTORY — DX: Peripheral vascular angioplasty status with implants and grafts: Z95.820

## 2015-04-10 LAB — HEMOGLOBIN A1C
HEMOGLOBIN A1C: 5.6 % (ref 4.8–5.6)
MEAN PLASMA GLUCOSE: 114 mg/dL

## 2015-04-10 LAB — CBC
HCT: 46.3 % (ref 39.0–52.0)
HEMOGLOBIN: 15.3 g/dL (ref 13.0–17.0)
MCH: 31.4 pg (ref 26.0–34.0)
MCHC: 33 g/dL (ref 30.0–36.0)
MCV: 94.9 fL (ref 78.0–100.0)
PLATELETS: 166 10*3/uL (ref 150–400)
RBC: 4.88 MIL/uL (ref 4.22–5.81)
RDW: 13.5 % (ref 11.5–15.5)
WBC: 9.6 10*3/uL (ref 4.0–10.5)

## 2015-04-10 MED ORDER — ATORVASTATIN CALCIUM 80 MG PO TABS: 80.0000 mg | ORAL_TABLET | Freq: Every day | ORAL | Status: DC

## 2015-04-10 MED ORDER — PANTOPRAZOLE SODIUM 40 MG PO TBEC: 40.0000 mg | DELAYED_RELEASE_TABLET | Freq: Every day | ORAL | Status: DC

## 2015-04-10 MED ORDER — CLOPIDOGREL BISULFATE 75 MG PO TABS: 75.0000 mg | ORAL_TABLET | Freq: Every day | ORAL | Status: DC

## 2015-04-10 MED ORDER — AMLODIPINE BESYLATE 10 MG PO TABS: 10.0000 mg | ORAL_TABLET | Freq: Every day | ORAL | Status: DC

## 2015-04-10 MED ORDER — ACETAMINOPHEN 325 MG PO TABS: 650.0000 mg | ORAL_TABLET | ORAL | Status: DC | PRN

## 2015-04-10 MED ORDER — NITROGLYCERIN 0.4 MG SL SUBL: 0.4000 mg | SUBLINGUAL_TABLET | SUBLINGUAL | Status: DC | PRN

## 2015-04-10 MED ORDER — ASPIRIN 81 MG PO CHEW: 81.0000 mg | CHEWABLE_TABLET | Freq: Every day | ORAL | Status: DC

## 2015-04-10 MED ORDER — LOSARTAN POTASSIUM 50 MG PO TABS: 50.0000 mg | ORAL_TABLET | Freq: Every day | ORAL | Status: DC

## 2015-04-10 MED ORDER — LOSARTAN POTASSIUM 50 MG PO TABS
50.0000 mg | ORAL_TABLET | Freq: Every day | ORAL | Status: DC
  Administered 2015-04-10: 50 mg via ORAL
  Filled 2015-04-10: qty 1

## 2015-04-10 MED ORDER — HYDROCHLOROTHIAZIDE 12.5 MG PO CAPS: 12.5000 mg | ORAL_CAPSULE | Freq: Every day | ORAL | Status: DC

## 2015-04-10 MED ORDER — NEBIVOLOL HCL 20 MG PO TABS: 20.0000 mg | ORAL_TABLET | Freq: Every day | ORAL | Status: DC

## 2015-04-10 NOTE — Progress Notes (Signed)
Patient ID: David Chang, male   DOB: 1961-04-15, 54 y.o.   MRN: [id number]    SUBJECTIVE: No chest pain or dyspnea.  Walked around unit today without problems.   Scheduled Meds: . amLODipine  10 mg Oral Daily  . aspirin  81 mg Oral Daily  . atorvastatin  80 mg Oral q1800  . clopidogrel  75 mg Oral Q breakfast  . hydrochlorothiazide  12.5 mg Oral Daily  . losartan  50 mg Oral Daily  . nebivolol  20 mg Oral Daily  . pantoprazole  40 mg Oral Daily  . sodium chloride  3 mL Intravenous Q12H   Continuous Infusions: . nitroGLYCERIN Stopped (04/09/15 1100)   PRN Meds:.sodium chloride, acetaminophen, hydrALAZINE, ondansetron (ZOFRAN) IV, sodium chloride    Filed Vitals:   04/09/15 2103 04/10/15 0018 04/10/15 0437 04/10/15 0731  BP:    153/97  Pulse:    75  Temp: 99.2 F (37.3 C) 99 F (37.2 C) 98.4 F (36.9 C) 98.7 F (37.1 C)  TempSrc: Oral Oral Oral Oral  Resp:    18  Height:      Weight:      SpO2: 96% 94%  4%    Intake/Output Summary (Last 24 hours) at 04/10/15 0944 Last data filed at 04/10/15 0900  Gross per 24 hour  Intake 1344.5 ml  Output      0 ml  Net 1344.5 ml    LABS: Basic Metabolic Panel:  Recent Labs  04/08/15 0545 04/09/15 0500  NA 139 137  K 3.7 3.7  CL 107 104  CO2 25 27  GLUCOSE 101* 98  BUN 23* 15  CREATININE 1.40* 1.23  CALCIUM 8.3* 7.9*   Liver Function Tests:  Recent Labs  04/08/15 0028  AST 133*  ALT 45  ALKPHOS 30*  BILITOT 0.6  PROT 5.8*  ALBUMIN 3.1*   No results for input(s): LIPASE, AMYLASE in the last 72 hours. CBC:  Recent Labs  04/08/15 0028  04/09/15 0500 04/10/15 0257  WBC 13.7*  < > 11.5* 9.6  NEUTROABS 11.6*  --   --   --   HGB 17.2*  < > 16.0 15.3  HCT 50.0  < > 47.8 46.3  MCV 92.9  < > 94.1 94.9  PLT 204  < > 176 166  < > = values in this interval not displayed. Cardiac Enzymes:  Recent Labs  04/08/15 0028 04/08/15 0545 04/08/15 1220  TROPONINI 18.45* 36.83* 29.45*   BNP: Invalid  input(s): POCBNP D-Dimer: No results for input(s): DDIMER in the last 72 hours. Hemoglobin A1C:  Recent Labs  04/08/15 0028  HGBA1C 5.6   Fasting Lipid Panel:  Recent Labs  04/08/15 0545  CHOL 251*  HDL 23*  LDLCALC 203*  TRIG 124  CHOLHDL 10.9   Thyroid Function Tests:  Recent Labs  04/08/15 0028  TSH 2.732   Anemia Panel: No results for input(s): VITAMINB12, FOLATE, FERRITIN, TIBC, IRON, RETICCTPCT in the last 72 hours.  RADIOLOGY: Dg Chest 1 View  04/07/2015  CLINICAL DATA:  Chest pain today after working out pt had abnormal EKG at urgent care EXAM: CHEST  1 VIEW COMPARISON:  None available FINDINGS: Lungs clear. Heart size upper limits normal for technique. Tortuous thoracic aorta. No pneumothorax. No effusion. Visualized skeletal structures are unremarkable. IMPRESSION: No acute cardiopulmonary disease. Electronically Signed   By: Lucrezia Europe M.D.   On: 04/07/2015 21:36    PHYSICAL EXAM General: NAD Neck: No JVD, no thyromegaly or thyroid  nodule.  Lungs: Clear to auscultation bilaterally with normal respiratory effort. CV: Nondisplaced PMI.  Heart regular S1/S2, no S3/S4, no murmur.  No peripheral edema.  No carotid bruit.  Normal pedal pulses.  Abdomen: Soft, nontender, no hepatosplenomegaly, no distention.  Neurologic: Alert and oriented x 3.  Psych: Normal affect. Extremities: No clubbing or cyanosis.   TELEMETRY: Reviewed telemetry pt in NSR  ASSESSMENT AND PLAN: 54 yo with history of HTN was admitted with NSTEMI, had 3 vessel PCI.  1. CAD: NSTEMI with DES to mLAD, pRCA, and mLCx.   - Continue ASA 81 and Plavix long-term - atorvastatin.  2. HTN: BP continues to run high.  Continue nebivolol, amlodipine, and HCTZ.  Increase losartan to 50 mg daily.  3. Ischemic cardiomyopathy: Mildly decreased EF, 45-50%.  Continue nebivolol, increasing ARB.  4. GERD: Occasional.  If he needs a med for this, would use Protonix given Plavix use.  5. Disposition: Home  today.  Needs followup with cardiology in 2 wks.  Arrange cardiac rehab.  Meds: amlodipine 10 daily, ASA 81, atorvastatin 80, Plavix 75, HCTZ 12.5 daily, losartan 50 daily, nebivolol 20 daily, protonix 40 daily (if needed).   Loralie Champagne 04/10/2015

## 2015-04-10 NOTE — Discharge Instructions (Signed)
Call [facility] at [phone] if any bleeding, swelling or drainage at cath site.  May shower, no tub baths for 48 hours for groin sticks. No lifting over 5 pounds for 5 days.  No Driving for 5 days  Take 1 NTG, under your tongue, while sitting.  If no relief of pain may repeat NTG, one tab every 5 minutes up to 3 tablets total over 15 minutes.  If no relief CALL 911.  If you have dizziness/lightheadness  while taking NTG, stop taking and call 911.        Heart healthy diet.  No work for 2 weeks.  No strenuous activity only walking for 2 weeks, we recommend cardiac rehab.   Do Not stop asprin and plavix stopping may cause a heart attack.

## 2015-04-10 NOTE — Discharge Summary (Signed)
Physician Discharge Summary       Patient ID: David Chang MRN: [id number] DOB/AGE: Dec 16, 1960 54 y.o.  Admit date: 04/07/2015 Discharge date: 04/10/2015 Primary Cardiologist:Dr. Tamala Julian   Discharge Diagnoses:  Principal Problem:   Non-STEMI (non-ST elevated myocardial infarction) [facility]) Active Problems:   Coronary artery disease involving native coronary artery of native heart with unstable angina pectoris (HCC)   S/P angioplasty with stent to mLAD DES, pRCA DES, mLCX DES 04/08/15    Acute coronary syndrome (HCC)   Hypertension, essential   Hyperlipidemia LDL goal <70   Ischemic cardiomyopathy   Discharged Condition: good  Procedures: cardiac cath 04/08/15 by Dr. Ellyn Hack  1. Severe 3 vessel CAD with 3 successful PCI. 2. Mid LAD lesion, 90% stenosed. PCI with a Promus Premier DES 3.5 mm x 16 mm (3.8 mm). Post intervention, there is a 0% residual stenosis. 3. Prox RCA lesion, 90% stenosed. PCI with a Promus Premier DES 4.0 mm x 20 mm (4.3 mm). Post intervention, there is a 0% residual stenosis. 4. Mid Cx lesion, 95% stenosed. PCI with a Promus Premier DES 3.0 mm x 16 mm (3.4 mm). Post intervention, there is a 0% residual stenosis. By Dr. 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 The intensity of the discomfort was graded as a 3/10. The discomfort started after he began taking a shower at home. The discomfort increased in intensity and radiated to the left arm. Total duration of the discomfort was 30 minutes or less starting around 6:10 PM and resolving by 6:40 PM. EMS was summoned. Initial transmitted EKG revealed diagnostic inferior ST elevation with reciprocal ST depression in 1 and aVL. Upon arrival in the emergency room nearly one hour later the patient was completely pain free. He denied dyspnea,  diaphoresis, nausea, vomiting, and apprehension. He was jovial and able to carry on a very engaging conversation.  Pt was admitted with IV heparin and plavix load. Also IV NTG. He initially refused cardiac cath but after at least 30 min discussion with Dr. Tamala Julian and his wife he agreed and cath planned for next AM.  troponin increased during the night. pk at 36.8.   Cardiac cath with 3 vessel disease. Stents to mLAD, pRCA and mLCX.  Residual disease of mRCA.  On Echo his EF 45-50% G1DD and poor acoustic windows. Lt. Atrium mildly dilated. Lipids elevated.  Pt continued to improve and ambulated with cardiac rehab.  He did have acute renal insuff that improved prior to discharge.  Pt seen and evaluated by Dr. Aundra Dubin and found ready for discharge 04/10/15. His BP is still elevated and meds were adjusted prior to discharge.    Plan for followup with cardiology in 2 wks. Cardiac rehab. Meds: amlodipine 10 daily, ASA 81, atorvastatin 80, Plavix 75, HCTZ 12.5 daily, losartan 50 daily, nebivolol 20 daily, protonix 40 daily (if needed).   Consults: None  Significant Diagnostic Studies:  BMP Latest Ref Rng 04/09/2015 04/08/2015 04/08/2015  Glucose 65 - 99 mg/dL 98 101(H) 154(H)  BUN 6 - 20 mg/dL 15 23(H) 23(H)  Creatinine 0.61 - 1.24 mg/dL 1.23 1.40(H) 1.44(H)  Sodium 135 - 145 mmol/L 137 139 137  Potassium 3.5 - 5.1 mmol/L 3.7 3.7 3.8  Chloride 101 - 111 mmol/L 104 107 104  CO2 22 - 32 mmol/L 27 25 24   Calcium 8.9 - 10.3 mg/dL 7.9(L) 8.3(L) 8.8(L)  CBC Latest Ref Rng 04/10/2015 04/09/2015 04/08/2015  WBC 4.0 - 10.5 K/uL 9.6 11.5(H) 11.5(H)  Hemoglobin 13.0 - 17.0 g/dL 15.3 16.0 16.1  Hematocrit 39.0 - 52.0 % 46.3 47.8 46.6  Platelets 150 - 400 K/uL 166 176 188   Lipid Panel     Component Value Date/Time   CHOL 251* 04/08/2015 0545   TRIG 124 04/08/2015 0545   HDL 23* 04/08/2015 0545   CHOLHDL 10.9 04/08/2015 0545   VLDL 25 04/08/2015 0545   LDLCALC 203* 04/08/2015 0545   BNP 68   pk  troponin 36.8 and trending down    Hepatic Function Latest Ref Rng 04/08/2015  Total Protein 6.5 - 8.1 g/dL 5.8(L)  Albumin 3.5 - 5.0 g/dL 3.1(L)  AST 15 - 41 U/L 133(H)  ALT 17 - 63 U/L 45  Alk Phosphatase 38 - 126 U/L 30(L)  Total Bilirubin 0.3 - 1.2 mg/dL 0.6   P2Y12 153  hgbA1c 5.6 TSH 2.7  EKG at discharge: Normal sinus rhythm Left axis deviation Left ventricular hypertrophy Inferior infarct , age undetermined T wave abnormality, consider lateral ischemia Abnormal ECG No significant change since last tracing    CHEST 1 VIEW COMPARISON: None available  FINDINGS: Lungs clear. Heart size upper limits normal for technique. Tortuous thoracic aorta. No pneumothorax. No effusion. Visualized skeletal structures are unremarkable. IMPRESSION: No acute cardiopulmonary disease.  CARDIAC CATH: Dominance: Right   Left Main  Vessel was injected. Vessel is large. Vessel is angiographically normal. TIG 4.0 catheter Very large     Left Anterior Descending  . Vessel is large. The vessel continues as a moderate caliber vessel down around the apex perfusing the distal third of the inferoapex.   . Mid LAD lesion, 90% stenosed. Diffuse eccentric.   Marland Kitchen PCI: The pre-interventional distal flow is decreased (TIMI 2). Pre-stent angioplasty was performed. A drug-eluting stent was placed. Minimum lumen area: 3.8 mm. The strut is apposed. Post-stent angioplasty was performed. With stent balloon at high atmospheres Lesion length: 14 mm. Maximum pressure: 18 atm. The post-interventional distal flow is normal (TIMI 3). The intervention was successful. No complications occurred at this lesion. CATH VISTA GUIDE 6FR XBLAD3.5 ([id number]) WIRE HI TORQ BMW 190CM ([id number] HC)  . Supplies used: BALLOON EMERGE MR 2.5X12; STENT PROMUS PREM MR 3.5X16  . There is no residual stenosis post intervention.     . First Diagonal Branch   The vessel is large in size and is angiographically normal.   .  Second Diagonal Branch   The vessel is small in size.   Marland Kitchen Second Septal Branch   The vessel is small in size.   . Third Diagonal Branch   The vessel is small in size.   . Third Septal Branch   The vessel is small in size.     Left Circumflex   . Mid Cx lesion, 95% stenosed. Discrete eccentric ulcerative located at the major branch.   . PCI: The pre-interventional distal flow is normal (TIMI 3). Pre-stent angioplasty was performed. A drug-eluting stent was placed. Minimum lumen area: 3.4 mm. The strut is apposed. Post-stent angioplasty was performed. Lesion length: 10 mm. Maximum pressure: 18 atm. The post-interventional distal flow is normal (TIMI 3). The intervention was successful. CATH VISTA GUIDE 6FR XBLAD3.5 ([id number]) WIRE HI TORQ BMW 190CM ([id number] HC); A second BMW wire was advanced in the small OM 2 to ensure maintained flow. OM 2 was not required as there was TIMI-3 flow in that branch with minimal ostial disease after jailing  with stent.  . Supplies used: BALLOON EMERGE MR 2.5X12; STENT PROMUS PREM MR 3.0X16; BALLOON [place] EMERGE MR [id number]  . There is no residual stenosis post intervention.     . First Obtuse Marginal Branch   The vessel is moderate in size and exhibits minimal luminal irregularities.   . Second Obtuse Marginal Branch   The vessel is small in size.   . Lateral Third Obtuse Marginal Branch   The vessel is moderate in size.     Right Coronary Artery  Vessel was injected. Vessel is large. JR4 catheter Very large   . Prox RCA lesion, 90% stenosed. Tubular eccentric ulcerative located at the bend.   Marland Kitchen PCI: The pre-interventional distal flow is decreased (TIMI 2). Pre-stent angioplasty was performed. A drug-eluting stent was placed. Minimum lumen area: 4.3 mm. The strut is apposed. Post-stent angioplasty was performed. Post-dilation with stent balloon to high atmospheres Lesion length: 16 mm. Maximum pressure: 20 atm. The post-interventional distal flow is normal (TIMI  3). The intervention was successful. No complications occurred at this lesion. CATH HEARTRAIL IKARI 43F IR1.0 [id number]) WIRE LUGE 182CM [id number] J) - Post dilation was not performed with a post-dilation balloon. Removing the stent balloon was difficult secondary to balloon getting stuck in the guide. In order to remove the balloon 92 extract the wire with the balloon. Therefore the decision was made to avoid rewiring the vessel as post PCI angiography revealed adequate apposition of the stent  . Supplies used: BALLOON EMERGE MR 2.5X12; STENT PROMUS PREM MR 4.0X20  . There is no residual stenosis post intervention.     . Mid RCA lesion, 45% stenosed. Diffuse eccentric.   Marland Kitchen Acute Marginal Branch   The vessel is moderate in size.   . Right Posterior Descending Artery   The vessel is moderate in size and is angiographically normal.   . Right Posterior Atrioventricular Branch   The vessel is normal in size and is moderate in size.   . First Right Posterolateral   The vessel is small in size.   Marland Kitchen Second Right Posterolateral   The vessel is small in size.   . Third Right Posterolateral   The vessel is small in size.      Wall Motion    Left ventriculogram not performed          Left Heart    Aortic Valve There is no aortic valve stenosis.    Coronary Diagrams    Diagnostic Diagram           Post-Intervention Diagram            Implants    Name ID Temporary Type Supply   STENT PROMUS PREM MR 3.0X16 - [id number] No Permanent Stent STENT PROMUS PREM MR 3.0X16   STENT PROMUS PREM MR 3.5X16 - [id number] No Permanent Stent STENT PROMUS PREM MR 3.5X16   STENT PROMUS PREM MR 4.0X20 - [id number] No Permanent Stent STENT PROMUS PREM MR 4.0X20    PACS Images    Show images for Cardiac catheterization     Link to Procedure Log    Procedure Log      Hemo Data       Most Recent Value   LV Systolic Pressure  144 mmHg   LV Diastolic Pressure  8  mmHg   LV EDP  16 mmHg   Arterial Occlusion Pressure Extended Systolic Pressure  315 mmHg   Arterial Occlusion Pressure Extended Diastolic Pressure  400 mmHg  Arterial Occlusion Pressure Extended Mean Pressure  129 mmHg   Left Ventricular Apex Extended Systolic Pressure  818 mmHg   Left Ventricular Apex Extended Diastolic Pressure  7 mmHg   Left Ventricular Apex Extended EDP Pressure  16 mmHg     ECHO: Study Conclusions  - Procedure narrative: Transthoracic echocardiography. Image quality was poor. The study was technically difficult, as a result of poor acoustic windows, poor sound wave transmission, restricted patient mobility, and body habitus. - Left ventricle: The cavity size was normal. Wall thickness was increased in a pattern of moderate LVH. Systolic function was mildly reduced. The estimated ejection fraction was in the range of 45% to 50%. Regional wall motion abnormalities cannot be excluded. Doppler parameters are consistent with abnormal left ventricular relaxation (grade 1 diastolic dysfunction). - Aortic valve: There was mild regurgitation. - Mitral valve: There was mild regurgitation. - Left atrium: The atrium was moderately dilated.   Discharge Exam: Blood pressure 153/97, pulse 75, temperature 98.7 F (37.1 C), temperature source Oral, resp. rate 18, height 6' 1"  (1.854 m), weight 231 lb 4.2 oz (104.9 kg), SpO2 4 %.  Disposition: HOME      Discharge Instructions    AMB Referral to Cardiac Rehabilitation - Phase II    Complete by:  As directed   Diagnosis:   Myocardial Infarction PCI       Amb Referral to Cardiac Rehabilitation    Complete by:  As directed   Diagnosis:   Myocardial Infarction PCI              Medication List    STOP taking these medications        aspirin EC 81 MG tablet  Replaced by:  aspirin 81 MG chewable tablet     meloxicam 15 MG tablet  Commonly known as:  MOBIC     MILK THISTLE PO      omeprazole 20 MG capsule  Commonly known as:  PRILOSEC  Replaced by:  pantoprazole 40 MG tablet      TAKE these medications        acetaminophen 325 MG tablet  Commonly known as:  TYLENOL  Take 2 tablets (650 mg total) by mouth every 4 (four) hours as needed for headache or mild pain.     amLODipine 10 MG tablet  Commonly known as:  NORVASC  Take 1 tablet (10 mg total) by mouth daily.     aspirin 81 MG chewable tablet  Chew 1 tablet (81 mg total) by mouth daily.     atorvastatin 80 MG tablet  Commonly known as:  LIPITOR  Take 1 tablet (80 mg total) by mouth daily at 6 PM.     clopidogrel 75 MG tablet  Commonly known as:  PLAVIX  Take 1 tablet (75 mg total) by mouth daily with breakfast.     FISH OIL PO  Take 1 tablet by mouth daily.     hydrochlorothiazide 12.5 MG capsule  Commonly known as:  MICROZIDE  Take 1 capsule (12.5 mg total) by mouth daily.     losartan 50 MG tablet  Commonly known as:  COZAAR  Take 1 tablet (50 mg total) by mouth daily.     Nebivolol HCl 20 MG Tabs  Take 1 tablet (20 mg total) by mouth daily.     nitroGLYCERIN 0.4 MG SL tablet  Commonly known as:  NITROSTAT  Place 1 tablet (0.4 mg total) under the tongue every 5 (five) minutes as needed for chest pain.  pantoprazole 40 MG tablet  Commonly known as:  PROTONIX  Take 1 tablet (40 mg total) by mouth daily.       Follow-up Information    Follow up with Sinclair Grooms, MD.   Specialty:  Cardiology   Why:  office will call Monday for appt date and time.    Contact information:   [address] [address] [phone]        Discharge Instructions: Call [facility] at [phone] if any bleeding, swelling or drainage at cath site.  May shower, no tub baths for 48 hours for groin sticks. No lifting over 5 pounds for 5 days.  No Driving for 5 days  Take 1 NTG, under your tongue, while sitting.  If no relief of pain may repeat NTG,  one tab every 5 minutes up to 3 tablets total over 15 minutes.  If no relief CALL 911.  If you have dizziness/lightheadness  while taking NTG, stop taking and call 911.        Heart healthy diet.  No work for 2 weeks.  No strenuous activity only walking for 2 weeks, we recommend cardiac rehab.  Do Not stop asprin and plavix stopping may cause a heart attack.   Signed: Isaiah Serge Nurse Practitioner-Certified [facility]: [facility] 04/10/2015, 11:04 AM  Time spent on discharge : > 35 minutes.

## 2015-04-10 NOTE — Progress Notes (Signed)
CARDIAC REHAB PHASE I   PRE:  Rate/Rhythm: 81 SR  BP:  Supine: 135/79  Sitting:   Standing:    SaO2: 92% RA  MODE:  Ambulation: 700 ft   POST:  Rate/Rhythm: 86  BP:  Supine:   Sitting: 148/91  Standing:    SaO2: 96% RA  0900-0930 Pt tolerated ambulation well, with no c/o, independent, gait steady. To bed after walk, call bell within reach, VSS.     Sol Passer, MS, ACSM CCEP

## 2015-04-19 ENCOUNTER — Telehealth: Payer: Self-pay | Admitting: Interventional Cardiology

## 2015-04-19 NOTE — Telephone Encounter (Signed)
Returned pt call. Adv pt that he should avoid strenuous activity until he is seen for his post hosp f/u on 12/5. He can discuss resuming exercise at his appt.  She sts that pt is scheduled to have a cortisone injection. Adv her that she contact pt orthopedic to let them know of pt recent cardiac event , and that he is on Plavix, adv her that pt Plavix should not be interrupted. Pt and wife  Verbalized understanding. Pt cath report reviewed with Havery Moros and she is in agreement.

## 2015-04-19 NOTE — Telephone Encounter (Signed)
New message      Pt had a heart attack.  He was discharged from hosp around nov 19th.  Pt want to know if he can ride a stationary bike?  He had 3 stents put in.

## 2015-04-20 ENCOUNTER — Telehealth: Payer: Self-pay | Admitting: Interventional Cardiology

## 2015-04-20 MED ORDER — TICAGRELOR 90 MG PO TABS: 90.0000 mg | ORAL_TABLET | Freq: Two times a day (BID) | ORAL | Status: DC

## 2015-04-20 NOTE — Telephone Encounter (Signed)
Returned pt wife call. She reports that the pt has developed a rash on his left palm leading all the way up to his bicep, with cramping in his wrist and elbow. They have been applying topical benadryl with no relief. Pt has been complaining of muscle pain and soreness all over his body. Pt has been starting on several new medications since d/c from [facility]. Adv pt and pt wife I will discuss pt symptoms with Dr.Smith and call back with his recommendation. Pt wife agreeable with plan and verbalized understanding.

## 2015-04-20 NOTE — Telephone Encounter (Signed)
Pt wife sts that the pt normally takes his Plavix in the morning and has already taken it today. Adv her that they should pick up Brilinta today, and pt should take his first dose this evening. Pt will d/c Plavix and continue [place]. Pt wife verbalized understanding.

## 2015-04-20 NOTE — Telephone Encounter (Signed)
New message     Wife calling  C/O rash on palm on hand- sharp pain between elbow and hand- comes and goes. PCP was not contacted .    S/p Heart Attack 11/16   Pt C/O medication issue:  1. Name of Medication: unsure which medication is causing this   2. How are you currently taking this medication (dosage and times per day)? Wife unsure of which medication   3. Are you having a reaction (difficulty breathing--STAT)? Rash in palm on hand / burning / muscle spasm   4. What is your medication issue? Have some concerns

## 2015-04-20 NOTE — Telephone Encounter (Signed)
Pt wife aware of Dr.Smith's recommendation Stop Atorvastatin, Stop Plavix. Start Brilinta 90mg  bid. An Rx has been sent to pt pharmacy. Adv pt wife to call back if pt symptoms have not resolved with med change. Pt wife voiced appreciation and verbalized understanding.

## 2015-04-21 NOTE — Telephone Encounter (Signed)
Patients wife left a voicemail on the refill line in regards to new medication not being covered. Message was sent to [facility] this AM, but she is not here today.

## 2015-04-21 NOTE — Telephone Encounter (Signed)
Follow Up  Pt states that there is paper work for pre auth for the replacement medication of plavix that will need to be filled out before they can receive the medication. Please assist.

## 2015-04-22 ENCOUNTER — Telehealth: Payer: Self-pay

## 2015-04-22 NOTE — Telephone Encounter (Signed)
Prior auth obtained for this today.

## 2015-04-22 NOTE — Telephone Encounter (Signed)
Prior auth for Brilinta 90mg  obtained from [facility] # [id number]. Pharmacy notified.

## 2015-04-25 NOTE — Progress Notes (Signed)
Cardiology Office Note   Date:  04/25/2015   ID:  David Chang, DOB September 11, 1960, MRN [id number]  PCP:  Maryella Shivers, MD  Cardiologist:  Dr. 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 EMS was summoned. Initial transmitted EKG revealed diagnostic inferior ST elevation with reciprocal ST depression in 1 and aVL. Upon arrival in the emergency room nearly one hour later the patient was completely pain free. He was jovial and able to carry on a very engaging conversation. Pt was admitted with IV NTG and heparin as well as plavix load. He initially refused cardiac cath but after at least 30 min discussion with Dr. Tamala Julian and his wife he agreed and cath planned for next AM. Troponin increased during the night. pk at 36.8. Cardiac cath revealed 3 vessel disease and stents were placed to the [place], pRCA and mLCX.Residual disease of mRCA. On Echo his EF 45-50% G1DD and poor acoustic windows. Lt. Atrium mildly dilated. Lipids elevated.Pt continued to improve and ambulated with cardiac rehab. He did have acute renal insuff that improved prior to discharge  Phone notes reveal that he called about a rash on his left arm that he thought was related to medications. He also complained of myalgias. Dr. Tamala Julian switched plavix to brilinta and stopped atorvastatin 80mg    Today he presents for post hospital follow up. His arm rash is getting better but still itches and burns. He is doing well on the Brilinta. No CP or SOB. No LE edema,  orthopnea, PND or palpitations. He is very eager to get back to the gym but was not he had to wait. Very interested in cardiac rehab. He has a referral for [facility] but wants to do it at [facility].   Past Medical History  Diagnosis Date  . Hypertension   . S/P angioplasty with stent to mLAD DES, pRCA DES, mLCX DES 04/08/15  04/10/2015  . Coronary artery disease   . Hyperlipidemia LDL goal <70 04/10/2015  . Ischemic cardiomyopathy 04/10/2015    Past Surgical History  Procedure Laterality Date  . Knee surgery Left   . Cardiac catheterization N/A 04/08/2015    Procedure: Left Heart Cath and Coronary Angiography;  Surgeon: Leonie Man, MD;  Location: [facility];  Service: Cardiovascular;  Laterality: N/A;     Current Outpatient Prescriptions  Medication Sig Dispense Refill  . acetaminophen (TYLENOL) 325 MG tablet Take 2 tablets (650 mg total) by mouth every 4 (four) hours as needed for headache or mild pain.    Marland Kitchen amLODipine (NORVASC) 10 MG tablet Take 1 tablet (10 mg total) by mouth daily. 30 tablet 6  . aspirin 81 MG chewable tablet Chew 1 tablet (81 mg total) by mouth daily.    . hydrochlorothiazide (MICROZIDE) 12.5 MG capsule Take 1 capsule (12.5 mg total) by mouth daily. 30 capsule 6  . losartan (COZAAR) 50 MG tablet Take 1 tablet (50 mg total) by mouth daily. 30 tablet 6  . nebivolol 20 MG  TABS Take 1 tablet (20 mg total) by mouth daily. 30 tablet 11  . nitroGLYCERIN (NITROSTAT) 0.4 MG SL tablet Place 1 tablet (0.4 mg total) under the tongue every 5 (five) minutes as needed for chest pain. 25 tablet 4  . Omega-3 Fatty Acids (FISH OIL PO) Take 1 tablet by mouth daily.    . pantoprazole (PROTONIX) 40 MG tablet Take 1 tablet (40 mg total) by mouth daily. 30 tablet 3  . ticagrelor (BRILINTA) 90 MG TABS tablet Take 1 tablet (90 mg total) by mouth 2 (two) times daily. 60 tablet 11   No current facility-administered medications for this visit.    Allergies:   Review of patient's  allergies indicates no known allergies.    Social History:  The patient  reports that he has never smoked. He does not have any smokeless tobacco history on file. He reports that he drinks alcohol. He reports that he does not use illicit drugs.   Family History:  The patient's family history includes CVA in his other.    ROS:  Please see the history of present illness.   Otherwise, review of systems are positive for NONE.   All other systems are reviewed and negative.    PHYSICAL EXAM: VS:  There were no vitals taken for this visit. , BMI There is no weight on file to calculate BMI. GEN: Well nourished, well developed, in no acute distress HEENT: normal Neck: no JVD, carotid bruits, or masses Cardiac: RRR; no murmurs, rubs, or gallops,no edema. Left arm with scattered red papules and vesicles extending into the hand. Respiratory:  clear to auscultation bilaterally, normal work of breathing GI: soft, nontender, nondistended, + BS MS: no deformity or atrophy Skin: warm and dry, no rash Neuro:  Strength and sensation are intact Psych: euthymic mood, full affect   EKG:  EKG is ordered today. The ekg ordered today demonstrates  NSR HR 60. Minimal  LVH. TWI similar to previous   Recent Labs: 04/08/2015: ALT 45; B Natriuretic Peptide 68.4; TSH 2.732 04/09/2015: BUN 15; Creatinine, Ser 1.23; Potassium 3.7; Sodium 137 04/10/2015: Hemoglobin 15.3; Platelets 166    Lipid Panel    Component Value Date/Time   CHOL 251* 04/08/2015 0545   TRIG 124 04/08/2015 0545   HDL 23* 04/08/2015 0545   CHOLHDL 10.9 04/08/2015 0545   VLDL 25 04/08/2015 0545   LDLCALC 203* 04/08/2015 0545      Wt Readings from Last 3 Encounters:  04/07/15 231 lb 4.2 oz (104.9 kg)      Other studies Reviewed: Additional studies/ records that were reviewed today include: LHC. 2D ECHO. Review of the above records demonstrates:   Cardiac cath 04/08/15 by Dr. Ellyn Hack  1. Severe 3 vessel CAD with 3 successful  PCI. 2. Mid LAD lesion, 90% stenosed. PCI with a Promus Premier DES 3.5 mm x 16 mm (3.8 mm). Post intervention, there is a 0% residual stenosis. 3. Prox RCA lesion, 90% stenosed. PCI with a Promus Premier DES 4.0 mm x 20 mm (4.3 mm). Post intervention, there is a 0% residual stenosis. 4. Mid Cx lesion, 95% stenosed. PCI with a Promus Premier DES 3.0 mm x 16 mm (3.4 mm). Post intervention, there is a 0% residual stenosis. By Dr. Ellyn Hack  2D ECHO (04/08/15): EF 45-50% G1DD and poor acoustic windows. Lt. Atrium mildly dilated.   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  CAD: NSTEMI (pk troponin ~37) with DES to mLAD, pRCA, and mLCx (04/08/15) - Continue ASA 81 and Brilinta long-term, BB. Atorvastatin caused myalgias. We will try Crestor 20mg  as he thinks he was on this remotely and did well. If he tolerates this, we could increase to 40mg  at next appointment.  - Cardiac rehab, we have filled out paperwork for [facility]  HTN: BP 120/90 mm Hg. Continue nebivolol 20 qd, amlodipine 10mg  qd, and HCTZ 12.5 qd and losartan 50 mg daily.   Ischemic cardiomyopathy: Mildly decreased EF, 45-50%. - Appears euvolemic - Continue nebivolol and losartan.   GERD: Continue Protonix   Rash on left arm- felt to be due to plavix. Switched to [employer]. Provided a referral to [facility]   Current medicines are reviewed at length with the patient today.  The patient does not have concerns regarding medicines.  The following changes have been made:  no change  Labs/ tests ordered today include:  No orders of the defined types were placed in this encounter.     Disposition:   FU with Dr. Tamala Julian 6-8 weeks  Signed, Eileen Stanford, PA-C  04/25/2015  [facility] [address], [place], [place]  [address] Phone: [phone]; Fax: [phone]

## 2015-04-26 ENCOUNTER — Encounter: Payer: Self-pay | Admitting: Physician Assistant

## 2015-04-26 ENCOUNTER — Ambulatory Visit (INDEPENDENT_AMBULATORY_CARE_PROVIDER_SITE_OTHER): Payer: 59 | Admitting: Physician Assistant

## 2015-04-26 VITALS — BP 120/90 | HR 60 | Ht 73.0 in | Wt 217.0 lb

## 2015-04-26 DIAGNOSIS — I2129 ST elevation (STEMI) myocardial infarction involving other sites: Secondary | ICD-10-CM | POA: Diagnosis not present

## 2015-04-26 MED ORDER — ROSUVASTATIN CALCIUM 20 MG PO TABS: 20.0000 mg | ORAL_TABLET | Freq: Every day | ORAL | Status: DC

## 2015-04-26 NOTE — Patient Instructions (Signed)
Medication Instructions:  Your physician has recommended you make the following change in your medication:  1) START Crestor 20 mg by mouth daily  Labwork: none  Testing/Procedures: none  Follow-Up: Your physician recommends that you schedule a follow-up appointment in: 6-8 WEEKS ([place]) [place].   Any Other Special Instructions Will Be Listed Below (If Applicable).  You have been RECOMMENDs you VISIT [facility]- Phone: [phone].    If you need a refill on your cardiac medications before your next appointment, please call your pharmacy.

## 2015-05-06 ENCOUNTER — Encounter (HOSPITAL_COMMUNITY)
Admission: RE | Admit: 2015-05-06 | Discharge: 2015-05-06 | Disposition: A | Discharge status: Home / Self Care | Payer: 59 | Source: Ambulatory Visit | Attending: Interventional Cardiology | Admitting: Interventional Cardiology

## 2015-05-06 DIAGNOSIS — I214 Non-ST elevation (NSTEMI) myocardial infarction: Secondary | ICD-10-CM | POA: Insufficient documentation

## 2015-05-06 DIAGNOSIS — R079 Chest pain, unspecified: Secondary | ICD-10-CM | POA: Insufficient documentation

## 2015-05-06 NOTE — Progress Notes (Signed)
Cardiac Rehab Medication Review by a Pharmacist  Does the patient  feel that his/her medications are working for him/her?  yes  Has the patient been experiencing any side effects to the medications prescribed?  no  Does the patient measure his/her own blood pressure or blood glucose at home?  no   Does the patient have any problems obtaining medications due to transportation or finances?   no  Understanding of regimen: excellent Understanding of indications: good Potential of compliance: good    Pharmacist comments: Pt stated that his wife puts out his medications for him and he just takes them.  Pt was unsure if he was taking losartan or hydrochlorothiazide but I wrote them down for him to go home and check.  Pt demonstrated no cost issues for his medications and seemed to have understanding of his regimen.     Lowella Grip E Arleigh Dicola 05/06/2015 8:16 AM

## 2015-05-10 ENCOUNTER — Encounter (HOSPITAL_COMMUNITY)
Admission: RE | Admit: 2015-05-10 | Discharge: 2015-05-10 | Disposition: A | Discharge status: Home / Self Care | Payer: 59 | Source: Ambulatory Visit | Attending: Interventional Cardiology | Admitting: Interventional Cardiology

## 2015-05-10 DIAGNOSIS — R079 Chest pain, unspecified: Secondary | ICD-10-CM | POA: Diagnosis not present

## 2015-05-10 DIAGNOSIS — I214 Non-ST elevation (NSTEMI) myocardial infarction: Secondary | ICD-10-CM | POA: Diagnosis not present

## 2015-05-10 NOTE — Progress Notes (Signed)
Pt started cardiac rehab today.  Pt tolerated light exercise without difficulty. VSS, telemetry-Sinus rhtyhm, asymptomatic.  Medication list reconciled.  Pt verbalized compliance with medications and denies barriers to compliance. PSYCHOSOCIAL ASSESSMENT:  PHQ-0. Pt exhibits positive coping skills, hopeful outlook with supportive family. No psychosocial needs identified at this time, no psychosocial interventions necessary.    Pt enjoys watching and playing sports.   Pt cardiac rehab  goal is  to know what the heart is doing with exercise.  Pt encouraged to participate in exercising on your own   to increase ability to achieve these goals.   Pt long term cardiac rehab goal is peace of mind and being able to come off of his medications.  Pt oriented to exercise equipment and routine.  Understanding verbalized.

## 2015-05-12 ENCOUNTER — Encounter (HOSPITAL_COMMUNITY): Payer: 59

## 2015-05-14 ENCOUNTER — Encounter (HOSPITAL_COMMUNITY): Payer: 59

## 2015-05-19 ENCOUNTER — Encounter (HOSPITAL_COMMUNITY): Payer: 59

## 2015-05-20 ENCOUNTER — Telehealth (HOSPITAL_COMMUNITY): Payer: Self-pay | Admitting: *Deleted

## 2015-05-21 ENCOUNTER — Encounter (HOSPITAL_COMMUNITY): Payer: 59

## 2015-05-26 ENCOUNTER — Inpatient Hospital Stay (HOSPITAL_COMMUNITY): Admission: RE | Admit: 2015-05-26 | Payer: 59 | Source: Ambulatory Visit

## 2015-05-28 ENCOUNTER — Ambulatory Visit (HOSPITAL_COMMUNITY): Payer: 59

## 2015-05-31 ENCOUNTER — Ambulatory Visit (HOSPITAL_COMMUNITY): Payer: 59

## 2015-06-01 ENCOUNTER — Telehealth: Payer: Self-pay | Admitting: Interventional Cardiology

## 2015-06-01 NOTE — Telephone Encounter (Signed)
New message     Patient wants to know can he take Celebrex again due to knee hurting.

## 2015-06-02 ENCOUNTER — Ambulatory Visit (HOSPITAL_COMMUNITY): Payer: 59

## 2015-06-02 NOTE — Telephone Encounter (Signed)
Returned pt call. Adv pt that he should not take Celebrex or NSAIDs while on Brilinta.adv that he could take Tylenol as needed for pain. Adv him to discuss other pain management options with his pcp, pt voiced appreciation for the call back and verbalized understanding.

## 2015-06-04 ENCOUNTER — Ambulatory Visit (HOSPITAL_COMMUNITY): Payer: 59

## 2015-06-07 ENCOUNTER — Ambulatory Visit (HOSPITAL_COMMUNITY): Payer: 59

## 2015-06-08 ENCOUNTER — Telehealth: Payer: Self-pay | Admitting: Interventional Cardiology

## 2015-06-08 NOTE — Telephone Encounter (Signed)
New message      Pt c/o medication issue:  1. Name of Medication: bystolic 2. How are you currently taking this medication (dosage and times per day)?  20mg  daily 3. Are you having a reaction (difficulty breathing--STAT)? no 4. What is your medication issue? Per wife, [email or web address]  faxed a form to [place] for prior authorization.  They have not received the form back and pt will need a refill this week.  Calling to see if we received it and completed the form. Please call

## 2015-06-09 ENCOUNTER — Telehealth: Payer: Self-pay | Admitting: Interventional Cardiology

## 2015-06-09 ENCOUNTER — Ambulatory Visit (HOSPITAL_COMMUNITY): Payer: 59

## 2015-06-09 NOTE — Telephone Encounter (Signed)
New Message  Prior Auth    *STAT* If patient is at the pharmacy, call can be transferred to refill team.   1. Which medications need to be refilled? (please list name of each medication and dose if known) nebivolol 20 MG TABS   2. Which pharmacy/location (including street and city if local pharmacy) is medication to be sent to? Prior auth sent to [facility]  3. Do they need a 30 day or 90 day supply? Refills were for 10 months. Now have different insurance. Please call back to discuss

## 2015-06-10 NOTE — Telephone Encounter (Signed)
Spoke with patient's wife to obtain new Environmental consultant. PA for Bystolic 20mg  submitted to [facility].

## 2015-06-10 NOTE — Telephone Encounter (Signed)
Prior auth sent for Bystolic 20 mg sent to [facility]

## 2015-06-11 ENCOUNTER — Ambulatory Visit (HOSPITAL_COMMUNITY): Payer: 59

## 2015-06-14 ENCOUNTER — Ambulatory Visit (HOSPITAL_COMMUNITY): Payer: 59

## 2015-06-14 ENCOUNTER — Telehealth: Payer: Self-pay

## 2015-06-14 NOTE — Telephone Encounter (Signed)
Bystolic has been denied by [employer].

## 2015-06-14 NOTE — Telephone Encounter (Signed)
Left message with patient's wife to call me back to discuss denial of Bystolic.

## 2015-06-15 ENCOUNTER — Telehealth: Payer: Self-pay

## 2015-06-15 NOTE — Telephone Encounter (Signed)
Letter received from [facility] with denial of Bystolic. Alternatives are Atenolol, Carvedilol, Metoprolol etc. Would you like to change? Patient's wife aware of this.

## 2015-06-16 ENCOUNTER — Ambulatory Visit (HOSPITAL_COMMUNITY): Payer: 59

## 2015-06-16 NOTE — Telephone Encounter (Signed)
Switched to metoprolol succinate 50 mg daily

## 2015-06-17 ENCOUNTER — Other Ambulatory Visit: Payer: Self-pay

## 2015-06-17 MED ORDER — METOPROLOL SUCCINATE ER 50 MG PO TB24: 50.0000 mg | ORAL_TABLET | Freq: Every day | ORAL | Status: DC

## 2015-06-17 NOTE — Telephone Encounter (Signed)
Spoke with patient's wife today. Advised her of Metoprolol rx being sent to [facility] in [place].

## 2015-06-18 ENCOUNTER — Ambulatory Visit (HOSPITAL_COMMUNITY): Payer: 59

## 2015-06-21 ENCOUNTER — Ambulatory Visit (HOSPITAL_COMMUNITY): Payer: 59

## 2015-06-23 ENCOUNTER — Ambulatory Visit (HOSPITAL_COMMUNITY): Payer: 59

## 2015-06-25 ENCOUNTER — Ambulatory Visit (HOSPITAL_COMMUNITY): Payer: 59

## 2015-06-28 ENCOUNTER — Ambulatory Visit (HOSPITAL_COMMUNITY): Payer: 59

## 2015-06-30 ENCOUNTER — Ambulatory Visit (INDEPENDENT_AMBULATORY_CARE_PROVIDER_SITE_OTHER): Payer: BLUE CROSS/BLUE SHIELD | Admitting: Interventional Cardiology

## 2015-06-30 ENCOUNTER — Ambulatory Visit (HOSPITAL_COMMUNITY): Payer: 59

## 2015-06-30 ENCOUNTER — Encounter: Payer: Self-pay | Admitting: Interventional Cardiology

## 2015-06-30 VITALS — BP 124/84 | HR 86 | Ht 72.0 in | Wt 213.8 lb

## 2015-06-30 DIAGNOSIS — E785 Hyperlipidemia, unspecified: Secondary | ICD-10-CM | POA: Diagnosis not present

## 2015-06-30 DIAGNOSIS — I10 Essential (primary) hypertension: Secondary | ICD-10-CM

## 2015-06-30 DIAGNOSIS — I5042 Chronic combined systolic (congestive) and diastolic (congestive) heart failure: Secondary | ICD-10-CM | POA: Diagnosis not present

## 2015-06-30 DIAGNOSIS — I25110 Atherosclerotic heart disease of native coronary artery with unstable angina pectoris: Secondary | ICD-10-CM

## 2015-06-30 DIAGNOSIS — I255 Ischemic cardiomyopathy: Secondary | ICD-10-CM

## 2015-06-30 NOTE — Patient Instructions (Signed)
Medication Instructions:  Your physician recommends that you continue on your current medications as directed. Please refer to the Current Medication list given to you today.   Labwork: Please contact your Primary Care Physician to schedule a High Sensitivity C-Reactive Proteins  And Statins. Dr. Tamala Julian requests copy of results be faxed [phone] to our office.   Testing/Procedures: none  Follow-Up: Your physician wants you to follow-up in: 6 months with Dr. Tamala Julian. You will receive a reminder letter in the mail two months in advance. If you don't receive a letter, please call our office to schedule the follow-up appointment.   Any Other Special Instructions Will Be Listed Below (If Applicable).  Dr. Tamala Julian recommends that you increase your cardio/aerobic activity and less isometric exercise.  If you need a refill on your cardiac medications before your next appointment, please call your pharmacy.

## 2015-06-30 NOTE — Progress Notes (Signed)
Cardiology Office Note   Date:  06/30/2015   ID:  David Chang, DOB 1961/01/23, MRN [id number]  PCP:  Ernestene Kiel, MD  Cardiologist:  Sinclair Grooms, MD   Chief Complaint  Patient presents with  . Coronary Artery Disease      History of Present Illness: David Chang is a 54 y.o. male who presents for  CAD with multivessel DES including RCA, circumflex, and LAD. Hyperlipidemia not currently treated with statin therapy due to musculoskeletal syndrome, hypertension, and ischemic left ventricular dysfunction.   Asymptomatic with reference to angina and dyspnea. Back to full activity. Has altered his exercise program to include more cardio and less intense isometric activity. Could not take statin therapy because of muscle aches and pains. Overall he feels that he is doing well and is happy with his progress.    Past Medical History  Diagnosis Date  . Hypertension   . S/P angioplasty with stent to mLAD DES, pRCA DES, mLCX DES 04/08/15  04/10/2015  . Coronary artery disease   . Hyperlipidemia LDL goal <70 04/10/2015  . Ischemic cardiomyopathy 04/10/2015    Past Surgical History  Procedure Laterality Date  . Knee surgery Left   . Cardiac catheterization N/A 04/08/2015    Procedure: Left Heart Cath and Coronary Angiography;  Surgeon: Leonie Man, MD;  Location: [facility];  Service: Cardiovascular;  Laterality: N/A;     Current Outpatient Prescriptions  Medication Sig Dispense Refill  . acetaminophen (TYLENOL) 325 MG tablet Take 2 tablets (650 mg total) by mouth every 4 (four) hours as needed for headache or mild pain.    Marland Kitchen amLODipine (NORVASC) 10 MG tablet Take 1 tablet (10 mg total) by mouth daily. 30 tablet 6  . aspirin 81 MG chewable tablet Chew 1 tablet (81 mg total) by mouth daily.    . hydrochlorothiazide (MICROZIDE) 12.5 MG capsule Take 1 capsule (12.5 mg total) by mouth daily. 30 capsule 6  . losartan (COZAAR) 50 MG tablet Take 1 tablet (50 mg  total) by mouth daily. 30 tablet 6  . metoprolol succinate (TOPROL-XL) 50 MG 24 hr tablet Take 1 tablet (50 mg total) by mouth daily. Take with or immediately following a meal. 90 tablet 3  . nitroGLYCERIN (NITROSTAT) 0.4 MG SL tablet Place 1 tablet (0.4 mg total) under the tongue every 5 (five) minutes as needed for chest pain. 25 tablet 4  . Omega-3 Fatty Acids (FISH OIL PO) Take 1 tablet by mouth daily.    . ticagrelor (BRILINTA) 90 MG TABS tablet Take 1 tablet (90 mg total) by mouth 2 (two) times daily. 60 tablet 11   No current facility-administered medications for this visit.    Allergies:   Review of patient's allergies indicates no known allergies.    Social History:  The patient  reports that he has never smoked. He has never used smokeless tobacco. He reports that he drinks alcohol. He reports that he does not use illicit drugs.   Family History:  The patient's family history includes CVA in his other; Cancer (age of onset: 13) in his father; Heart attack (age of onset: 86) in his paternal grandfather; Hypertension in his sister.    ROS:  Please see the history of present illness.   Otherwise, review of systems are positive for  Skin rash, easy bruising, muscle aches and pains on statin therapy..   All other systems are reviewed and negative.    PHYSICAL EXAM: VS:  BP 124/84 mmHg  Pulse 86  Ht 6' (1.829 m)  Wt 213 lb 12.8 oz (96.979 kg)  BMI 28.99 kg/m2 , BMI Body mass index is 28.99 kg/(m^2). GEN: Well nourished, well developed, in no acute distress HEENT: normal Neck: no JVD, carotid bruits, or masses Cardiac: RRR.  There is no murmur, rub, or gallop. There is no edema. Respiratory:  clear to auscultation bilaterally, normal work of breathing. GI: soft, nontender, nondistended, + BS MS: no deformity or atrophy Skin: warm and dry, no rash Neuro:  Strength and sensation are intact Psych: euthymic mood, full affect   EKG:  EKG  Is not ordered today.    Recent  Labs: 04/08/2015: ALT 45; B Natriuretic Peptide 68.4; TSH 2.732 04/09/2015: BUN 15; Creatinine, Ser 1.23; Potassium 3.7; Sodium 137 04/10/2015: Hemoglobin 15.3; Platelets 166    Lipid Panel    Component Value Date/Time   CHOL 251* 04/08/2015 0545   TRIG 124 04/08/2015 0545   HDL 23* 04/08/2015 0545   CHOLHDL 10.9 04/08/2015 0545   VLDL 25 04/08/2015 0545   LDLCALC 203* 04/08/2015 0545      Wt Readings from Last 3 Encounters:  06/30/15 213 lb 12.8 oz (96.979 kg)  05/06/15 212 lb 8.4 oz (96.4 kg)  04/26/15 217 lb (98.431 kg)      Other studies Reviewed: Additional studies/ records that were reviewed today include:  echocardiogram. The findings include  Echocardiogram post infarction revealed an EF of 45-50%.    ASSESSMENT AND PLAN:  1. Coronary artery disease involving native coronary artery of native heart with unstable angina pectoris ([place])  asymptomatic  2. Hypertension, essential  well controlled  3. Hyperlipidemia LDL goal <70  no recent lipid panel. His continued Crestor because of muscle aches  4. Chronic combined systolic and diastolic heart failure, NYHA class 2 (HCC)  no evidence of volume overload  5. Ischemic cardiomyopathy  EF  45-50% postinfarct    Current medicines are reviewed at length with the patient today.  The patient has the following concerns regarding medicines:  Discussed alternative therapy for statin..  The following changes/actions have been instituted:     statin panel and high sensitivity CRP is requested from his primary care physician   More aerobic and less isometric exercise   Clinical follow-up in 6 months  Labs/ tests ordered today include:  No orders of the defined types were placed in this encounter.     Disposition:   FU with HS in 1 year  Signed, Sinclair Grooms, MD  06/30/2015 9:52 AM    [facility] [address], [place], [place]  [address] Phone: [phone]; Fax: [phone]

## 2015-07-02 ENCOUNTER — Ambulatory Visit (HOSPITAL_COMMUNITY): Payer: 59

## 2015-07-05 ENCOUNTER — Ambulatory Visit (HOSPITAL_COMMUNITY): Payer: 59

## 2015-07-07 ENCOUNTER — Ambulatory Visit (HOSPITAL_COMMUNITY): Payer: 59

## 2015-07-09 ENCOUNTER — Ambulatory Visit (HOSPITAL_COMMUNITY): Payer: 59

## 2015-07-12 ENCOUNTER — Ambulatory Visit (HOSPITAL_COMMUNITY): Payer: 59

## 2015-07-14 ENCOUNTER — Ambulatory Visit (HOSPITAL_COMMUNITY): Payer: 59

## 2015-07-16 ENCOUNTER — Ambulatory Visit (HOSPITAL_COMMUNITY): Payer: 59

## 2015-07-19 ENCOUNTER — Ambulatory Visit (HOSPITAL_COMMUNITY): Payer: 59

## 2015-07-19 ENCOUNTER — Encounter: Payer: Self-pay | Admitting: Interventional Cardiology

## 2015-07-21 ENCOUNTER — Ambulatory Visit (HOSPITAL_COMMUNITY): Payer: 59

## 2015-07-23 ENCOUNTER — Ambulatory Visit (HOSPITAL_COMMUNITY): Payer: 59

## 2015-07-26 ENCOUNTER — Ambulatory Visit (HOSPITAL_COMMUNITY): Payer: 59

## 2015-07-28 ENCOUNTER — Ambulatory Visit (HOSPITAL_COMMUNITY): Payer: 59

## 2015-07-30 ENCOUNTER — Ambulatory Visit (HOSPITAL_COMMUNITY): Payer: 59

## 2015-08-02 ENCOUNTER — Ambulatory Visit (HOSPITAL_COMMUNITY): Payer: 59

## 2015-08-04 ENCOUNTER — Ambulatory Visit (HOSPITAL_COMMUNITY): Payer: 59

## 2015-08-06 ENCOUNTER — Ambulatory Visit (HOSPITAL_COMMUNITY): Payer: 59

## 2015-08-09 ENCOUNTER — Ambulatory Visit (HOSPITAL_COMMUNITY): Payer: 59

## 2015-08-11 ENCOUNTER — Ambulatory Visit (HOSPITAL_COMMUNITY): Payer: 59

## 2015-08-13 ENCOUNTER — Ambulatory Visit (HOSPITAL_COMMUNITY): Payer: 59

## 2015-08-16 ENCOUNTER — Ambulatory Visit (HOSPITAL_COMMUNITY): Payer: 59

## 2015-08-18 ENCOUNTER — Ambulatory Visit (HOSPITAL_COMMUNITY): Payer: 59

## 2015-08-20 ENCOUNTER — Ambulatory Visit (HOSPITAL_COMMUNITY): Payer: 59

## 2015-08-23 ENCOUNTER — Ambulatory Visit (HOSPITAL_COMMUNITY): Payer: 59

## 2015-08-25 ENCOUNTER — Ambulatory Visit (HOSPITAL_COMMUNITY): Payer: 59

## 2015-08-27 ENCOUNTER — Ambulatory Visit (HOSPITAL_COMMUNITY): Payer: 59

## 2015-08-30 ENCOUNTER — Ambulatory Visit (HOSPITAL_COMMUNITY): Payer: 59

## 2015-10-28 DIAGNOSIS — M1712 Unilateral primary osteoarthritis, left knee: Secondary | ICD-10-CM | POA: Diagnosis not present

## 2015-11-03 ENCOUNTER — Other Ambulatory Visit: Payer: Self-pay | Admitting: Cardiology

## 2015-12-07 DIAGNOSIS — M1712 Unilateral primary osteoarthritis, left knee: Secondary | ICD-10-CM | POA: Diagnosis not present

## 2015-12-07 DIAGNOSIS — M79672 Pain in left foot: Secondary | ICD-10-CM | POA: Diagnosis not present

## 2015-12-13 DIAGNOSIS — M1712 Unilateral primary osteoarthritis, left knee: Secondary | ICD-10-CM | POA: Diagnosis not present

## 2015-12-14 DIAGNOSIS — M1711 Unilateral primary osteoarthritis, right knee: Secondary | ICD-10-CM | POA: Diagnosis not present

## 2015-12-16 DIAGNOSIS — Z79899 Other long term (current) drug therapy: Secondary | ICD-10-CM | POA: Diagnosis not present

## 2015-12-16 DIAGNOSIS — I10 Essential (primary) hypertension: Secondary | ICD-10-CM | POA: Diagnosis not present

## 2016-01-13 DIAGNOSIS — C44319 Basal cell carcinoma of skin of other parts of face: Secondary | ICD-10-CM | POA: Diagnosis not present

## 2016-01-13 DIAGNOSIS — C44519 Basal cell carcinoma of skin of other part of trunk: Secondary | ICD-10-CM | POA: Diagnosis not present

## 2016-01-13 DIAGNOSIS — L578 Other skin changes due to chronic exposure to nonionizing radiation: Secondary | ICD-10-CM | POA: Diagnosis not present

## 2016-01-13 DIAGNOSIS — B360 Pityriasis versicolor: Secondary | ICD-10-CM | POA: Diagnosis not present

## 2016-01-28 DIAGNOSIS — E785 Hyperlipidemia, unspecified: Secondary | ICD-10-CM | POA: Diagnosis not present

## 2016-01-28 DIAGNOSIS — I10 Essential (primary) hypertension: Secondary | ICD-10-CM | POA: Diagnosis not present

## 2016-01-28 DIAGNOSIS — I2510 Atherosclerotic heart disease of native coronary artery without angina pectoris: Secondary | ICD-10-CM | POA: Diagnosis not present

## 2016-01-28 DIAGNOSIS — Z79899 Other long term (current) drug therapy: Secondary | ICD-10-CM | POA: Diagnosis not present

## 2016-02-04 ENCOUNTER — Encounter: Payer: Self-pay | Admitting: Interventional Cardiology

## 2016-02-17 ENCOUNTER — Encounter: Payer: Self-pay | Admitting: Interventional Cardiology

## 2016-02-17 ENCOUNTER — Ambulatory Visit (INDEPENDENT_AMBULATORY_CARE_PROVIDER_SITE_OTHER): Payer: BLUE CROSS/BLUE SHIELD | Admitting: Interventional Cardiology

## 2016-02-17 VITALS — BP 136/100 | HR 78 | Ht 72.0 in | Wt 216.8 lb

## 2016-02-17 DIAGNOSIS — I10 Essential (primary) hypertension: Secondary | ICD-10-CM

## 2016-02-17 DIAGNOSIS — E785 Hyperlipidemia, unspecified: Secondary | ICD-10-CM

## 2016-02-17 DIAGNOSIS — I255 Ischemic cardiomyopathy: Secondary | ICD-10-CM

## 2016-02-17 DIAGNOSIS — I5042 Chronic combined systolic (congestive) and diastolic (congestive) heart failure: Secondary | ICD-10-CM | POA: Diagnosis not present

## 2016-02-17 DIAGNOSIS — Z95820 Peripheral vascular angioplasty status with implants and grafts: Secondary | ICD-10-CM

## 2016-02-17 DIAGNOSIS — Z959 Presence of cardiac and vascular implant and graft, unspecified: Secondary | ICD-10-CM | POA: Diagnosis not present

## 2016-02-17 MED ORDER — TICAGRELOR 90 MG PO TABS: 90.0000 mg | ORAL_TABLET | Freq: Two times a day (BID) | ORAL | 0 refills | Status: AC

## 2016-02-17 MED ORDER — CLOPIDOGREL BISULFATE 75 MG PO TABS: 75.0000 mg | ORAL_TABLET | Freq: Every day | ORAL | 3 refills | Status: DC

## 2016-02-17 NOTE — Progress Notes (Signed)
Cardiology Office Note    Date:  02/17/2016   ID:  David Chang, DOB [birth year], MRN [id number]  PCP:  Ernestene Kiel, MD  Cardiologist: Sinclair Grooms, MD   Chief Complaint  Patient presents with  . Coronary Artery Disease    History of Present Illness:  David Chang is a 55 y.o. male who presents for CAD with multivessel DES including RCA, circumflex, and LAD. Hyperlipidemia not currently treated with statin therapy due to musculoskeletal syndrome, hypertension, and ischemic left ventricular dysfunction.  He is now exercising, predominantly doing aerobic activity. No symptoms to suggest coronary disease. He needs to have a left total knee done.  He denies dyspnea. No arrhythmias or syncope.  He monitors his blood pressure at home. He states on average he is running 125/80 mmHg.  Past Medical History:  Diagnosis Date  . Coronary artery disease   . Hyperlipidemia LDL goal <70 04/10/2015  . Hypertension   . Ischemic cardiomyopathy 04/10/2015  . S/P angioplasty with stent to mLAD DES, pRCA DES, mLCX DES 04/08/15  04/10/2015    Past Surgical History:  Procedure Laterality Date  . CARDIAC CATHETERIZATION N/A 04/08/2015   Procedure: Left Heart Cath and Coronary Angiography;  Surgeon: Leonie Man, MD;  Location: [facility];  Service: Cardiovascular;  Laterality: N/A;  . KNEE SURGERY Left     Current Medications: Outpatient Medications Prior to Visit  Medication Sig Dispense Refill  . acetaminophen (TYLENOL) 325 MG tablet Take 2 tablets (650 mg total) by mouth every 4 (four) hours as needed for headache or mild pain.    Marland Kitchen amLODipine (NORVASC) 10 MG tablet TAKE ONE TABLET BY MOUTH ONCE DAILY 30 tablet 8  . aspirin 81 MG chewable tablet Chew 1 tablet (81 mg total) by mouth daily.    . hydrochlorothiazide (MICROZIDE) 12.5 MG capsule Take 1 capsule (12.5 mg total) by mouth daily. 30 capsule 6  . losartan (COZAAR) 50 MG tablet Take 1 tablet (50 mg total) by  mouth daily. 30 tablet 6  . metoprolol succinate (TOPROL-XL) 50 MG 24 hr tablet Take 1 tablet (50 mg total) by mouth daily. Take with or immediately following a meal. 90 tablet 3  . nitroGLYCERIN (NITROSTAT) 0.4 MG SL tablet Place 1 tablet (0.4 mg total) under the tongue every 5 (five) minutes as needed for chest pain. 25 tablet 4  . Omega-3 Fatty Acids (FISH OIL PO) Take 1 tablet by mouth daily.    . ticagrelor (BRILINTA) 90 MG TABS tablet Take 1 tablet (90 mg total) by mouth 2 (two) times daily. 60 tablet 11   No facility-administered medications prior to visit.      Allergies:   Review of patient's allergies indicates no known allergies.   Social History   Social History  . Marital status: Unknown    Spouse name: N/A  . Number of children: N/A  . Years of education: N/A   Social History Main Topics  . Smoking status: Never Smoker  . Smokeless tobacco: Never Used  . Alcohol use 0.0 oz/week  . Drug use: No  . Sexual activity: Yes   Other Topics Concern  . None   Social History Narrative  . None     Family History:  The patient's family history includes CVA in his other; Cancer (age of onset: 52) in his father; Heart attack (age of onset: 51) in his paternal grandfather; Hypertension in his sister.   ROS:   Please see the history of present  illness.    Limited by left knee discomfort. Easy bruising.  All other systems reviewed and are negative.   PHYSICAL EXAM:   VS:  BP (!) 136/100   Pulse 78   Ht 6' (1.829 m)   Wt 216 lb 12.8 oz (98.3 kg)   BMI 29.40 kg/m    GEN: Well nourished, well developed, in no acute distress  HEENT: normal  Neck: no JVD, carotid bruits, or masses Cardiac: RRR; no murmurs, rubs, or gallops,no edema  Respiratory:  clear to auscultation bilaterally, normal work of breathing GI: soft, nontender, nondistended, + BS MS: no deformity or atrophy  Skin: warm and dry, no rash Neuro:  Alert and Oriented x 3, Strength and sensation are  intact Psych: euthymic mood, full affect  Wt Readings from Last 3 Encounters:  02/17/16 216 lb 12.8 oz (98.3 kg)  06/30/15 213 lb 12.8 oz (97 kg)  05/06/15 212 lb 8.4 oz (96.4 kg)      Studies/Labs Reviewed:   EKG:  EKG  Normal sinus rhythm with nonspecific T-wave flattening. Occasional PVCs are noted.  Recent Labs: 04/08/2015: ALT 45; B Natriuretic Peptide 68.4; TSH 2.732 04/09/2015: BUN 15; Creatinine, Ser 1.23; Potassium 3.7; Sodium 137 04/10/2015: Hemoglobin 15.3; Platelets 166   Lipid Panel    Component Value Date/Time   CHOL 251 (H) 04/08/2015 0545   TRIG 124 04/08/2015 0545   HDL 23 (L) 04/08/2015 0545   CHOLHDL 10.9 04/08/2015 0545   VLDL 25 04/08/2015 0545   LDLCALC 203 (H) 04/08/2015 0545    Additional studies/ records that were reviewed today include:  Lipids are being monitored by primary care. It is been done twice since the above values were obtained.    ASSESSMENT:    1. S/P angioplasty with stent to mLAD DES, pRCA DES, mLCX DES 04/08/15    2. Chronic combined systolic and diastolic heart failure, NYHA class 2 ([place])   3. Hypertension, essential   4. Ischemic cardiomyopathy   5. Hyperlipidemia LDL goal <70      PLAN:  In order of problems listed above:  1. No angina. Multivessel PCI with second generation stents. We will discontinue Brilinta in November and start clopidogrel 75 mg daily. This medication can be discontinued at least 7 days prior to total knee replacement which I will clear him for. Surgery should not be performed prior to November however. 2. No evidence of volume overload. Last LVEF 45-50%. 3. Adequate blood pressure control. 2 g sodium diet discussed. 4. Not addressed 5. Followed by primary care    Medication Adjustments/Labs and Tests Ordered: Current medicines are reviewed at length with the patient today.  Concerns regarding medicines are outlined above.  Medication changes, Labs and Tests ordered today are listed in the  Patient Instructions below. There are no Patient Instructions on file for this visit.   Signed, Sinclair Grooms, MD  02/17/2016 11:49 AM    [facility] [address], [place], [place]  [address] Phone: [phone]; Fax: [phone]

## 2016-02-17 NOTE — Patient Instructions (Signed)
Medication Instructions:  1) IN November STOP your Brilinta  2) IN November START Clopidogrel (Plavix) 75mg  once daily  Labwork: None  Testing/Procedures: None  Follow-Up: Your physician wants you to follow-up in: 9 months with Dr. Tamala Julian. You will receive a reminder letter in the mail two months in advance. If you don't receive a letter, please call our office to schedule the follow-up appointment.   Any Other Special Instructions Will Be Listed Below (If Applicable).     If you need a refill on your cardiac medications before your next appointment, please call your pharmacy.

## 2016-02-25 DIAGNOSIS — M1712 Unilateral primary osteoarthritis, left knee: Secondary | ICD-10-CM | POA: Diagnosis not present

## 2016-03-02 DIAGNOSIS — R748 Abnormal levels of other serum enzymes: Secondary | ICD-10-CM | POA: Diagnosis not present

## 2016-03-09 DIAGNOSIS — I10 Essential (primary) hypertension: Secondary | ICD-10-CM | POA: Diagnosis not present

## 2016-03-09 DIAGNOSIS — I2510 Atherosclerotic heart disease of native coronary artery without angina pectoris: Secondary | ICD-10-CM | POA: Diagnosis not present

## 2016-03-09 DIAGNOSIS — Z01818 Encounter for other preprocedural examination: Secondary | ICD-10-CM | POA: Diagnosis not present

## 2016-03-17 DIAGNOSIS — M1712 Unilateral primary osteoarthritis, left knee: Secondary | ICD-10-CM | POA: Diagnosis not present

## 2016-03-24 ENCOUNTER — Telehealth: Payer: Self-pay | Admitting: Interventional Cardiology

## 2016-03-24 NOTE — Telephone Encounter (Signed)
Cleared for upcoming surgery. Okay to hold plavix 5-7 days prior to surgery and resume thereafter.

## 2016-03-24 NOTE — Telephone Encounter (Signed)
Request for surgical clearance:  1. What type of surgery is being performed? Lt TKA-medial and lateral w/wo patella resurfacing   2. When is this surgery scheduled? 04/24/16   3. Are there any medications that need to be held prior to surgery and how long?  Plavix  4. Name of physician performing surgery? Dr. Wynelle Link   5. What is your office phone and fax number? Ph [phone]  Fax [facility]

## 2016-03-24 NOTE — Telephone Encounter (Signed)
Fax placed in nurse fax box

## 2016-03-28 ENCOUNTER — Ambulatory Visit: Payer: Self-pay | Admitting: Orthopedic Surgery

## 2016-03-28 NOTE — Progress Notes (Signed)
Pt is being scheduled for preop appt; please place surgical orders in epic. Thanks.  

## 2016-03-30 DIAGNOSIS — M1712 Unilateral primary osteoarthritis, left knee: Secondary | ICD-10-CM | POA: Diagnosis not present

## 2016-04-17 ENCOUNTER — Encounter (HOSPITAL_COMMUNITY)
Admission: RE | Admit: 2016-04-17 | Discharge: 2016-04-17 | Disposition: A | Discharge status: Home / Self Care | Payer: BLUE CROSS/BLUE SHIELD | Source: Ambulatory Visit | Attending: Orthopedic Surgery | Admitting: Orthopedic Surgery

## 2016-04-17 ENCOUNTER — Telehealth: Payer: Self-pay | Admitting: Interventional Cardiology

## 2016-04-17 ENCOUNTER — Encounter (HOSPITAL_COMMUNITY): Payer: Self-pay

## 2016-04-17 DIAGNOSIS — M1712 Unilateral primary osteoarthritis, left knee: Secondary | ICD-10-CM | POA: Insufficient documentation

## 2016-04-17 DIAGNOSIS — Z01812 Encounter for preprocedural laboratory examination: Secondary | ICD-10-CM | POA: Diagnosis not present

## 2016-04-17 DIAGNOSIS — Z01818 Encounter for other preprocedural examination: Secondary | ICD-10-CM | POA: Insufficient documentation

## 2016-04-17 HISTORY — DX: Unspecified osteoarthritis, unspecified site: M19.90

## 2016-04-17 LAB — URINALYSIS, ROUTINE W REFLEX MICROSCOPIC
BILIRUBIN URINE: NEGATIVE
Glucose, UA: 100 mg/dL — AB
Hgb urine dipstick: NEGATIVE
Ketones, ur: NEGATIVE mg/dL
LEUKOCYTES UA: NEGATIVE
NITRITE: NEGATIVE
PH: 6 (ref 5.0–8.0)
Protein, ur: 100 mg/dL — AB
SPECIFIC GRAVITY, URINE: 1.025 (ref 1.005–1.030)

## 2016-04-17 LAB — CBC
HEMATOCRIT: 52.2 % — AB (ref 39.0–52.0)
HEMOGLOBIN: 17.9 g/dL — AB (ref 13.0–17.0)
MCH: 32.7 pg (ref 26.0–34.0)
MCHC: 34.3 g/dL (ref 30.0–36.0)
MCV: 95.3 fL (ref 78.0–100.0)
Platelets: 182 10*3/uL (ref 150–400)
RBC: 5.48 MIL/uL (ref 4.22–5.81)
RDW: 13.5 % (ref 11.5–15.5)
WBC: 9.2 10*3/uL (ref 4.0–10.5)

## 2016-04-17 LAB — COMPREHENSIVE METABOLIC PANEL
ALBUMIN: 4.5 g/dL (ref 3.5–5.0)
ALK PHOS: 39 U/L (ref 38–126)
ALT: 40 U/L (ref 17–63)
AST: 43 U/L — AB (ref 15–41)
Anion gap: 10 (ref 5–15)
BILIRUBIN TOTAL: 0.7 mg/dL (ref 0.3–1.2)
BUN: 31 mg/dL — AB (ref 6–20)
CALCIUM: 10.1 mg/dL (ref 8.9–10.3)
CO2: 26 mmol/L (ref 22–32)
CREATININE: 1.67 mg/dL — AB (ref 0.61–1.24)
Chloride: 104 mmol/L (ref 101–111)
GFR calc Af Amer: 51 mL/min — ABNORMAL LOW (ref >60)
GFR, EST NON AFRICAN AMERICAN: 44 mL/min — AB (ref >60)
GLUCOSE: 92 mg/dL (ref 65–99)
Potassium: 4.7 mmol/L (ref 3.5–5.1)
Sodium: 140 mmol/L (ref 135–145)
TOTAL PROTEIN: 7.9 g/dL (ref 6.5–8.1)

## 2016-04-17 LAB — URINE MICROSCOPIC-ADD ON
Bacteria, UA: NONE SEEN
RBC / HPF: NONE SEEN RBC/hpf (ref 0–5)
WBC, UA: NONE SEEN WBC/hpf (ref 0–5)

## 2016-04-17 LAB — SURGICAL PCR SCREEN
MRSA, PCR: NEGATIVE
STAPHYLOCOCCUS AUREUS: POSITIVE — AB

## 2016-04-17 LAB — PROTIME-INR
INR: 0.86
PROTHROMBIN TIME: 11.7 s (ref 11.4–15.2)

## 2016-04-17 LAB — APTT: APTT: 27 s (ref 24–36)

## 2016-04-17 LAB — ABO/RH: ABO/RH(D): A POS

## 2016-04-17 NOTE — Progress Notes (Signed)
DR Tamala Julian- 03/24/16- clearanceon chart  [place]- Dr Verdell Face 03/09/16 on chart.  EKG- 02/17/16- EPIC  04/08/15- ECHO- EPIC  02/17/16- LOV- cardiology- epic

## 2016-04-17 NOTE — Progress Notes (Signed)
Blood pressure at 1300 pm at time of preop appointment was 153/110 in right arm.   At 1320 pm right arm- 153/101.  Patient denies any dizziness , lightheadedness or chest pain.  1322pm- 146/112 in right arm.  left arn- 165/114.  At 1340pm- 142/104 in left arm and 158/111 in right arm..  1350pm- 176/106.  Dr Kalman Shan ( anesthesia ) made aware.  I told Dr Kalman Shan I would notify PCP-  Dr Verdell Face and Dr Tamala Julian ( cardiologist).  Dr Kalman Shan stated patient may need medication adjustmetn prior to surgery.

## 2016-04-17 NOTE — Patient Instructions (Signed)
David Chang  04/17/2016   Your procedure is scheduled on: 04/24/2016    Report to [facility] Main  Entrance take [place]  elevators to 3rd floor to  [place] at    0730 AM.  Call this number if you have problems the morning of surgery [phone]   Remember: ONLY 1 PERSON MAY GO WITH YOU TO SHORT STAY TO GET  READY MORNING OF [place].  Do not eat food or drink liquids :After Midnight.     Take these medicines the morning of surgery with A SIP OF WATER: Amlodipine ( Norvasc), Metoprolol ( Lopressor)                                You may not have any metal on your body including hair pins and              piercings  Do not wear jewelry,  lotions, powders or perfumes, deodorant                          Men may shave face and neck.   Do not bring valuables to the hospital. [place].  Contacts, dentures or bridgework may not be worn into surgery.  Leave suitcase in the car. After surgery it may be brought to your room.   Marland Kitchen    Special Instructions: N/A              Please read over the following fact sheets you were given: _____________________________________________________________________             [facility] - Preparing for [facility] Before surgery, you can play an important role.  Because skin is not sterile, your skin needs to be as free of germs as possible.  You can reduce the number of germs on your skin by washing with CHG (chlorahexidine gluconate) soap before surgery.  CHG is an antiseptic cleaner which kills germs and bonds with the skin to continue killing germs even after washing. Please DO NOT use if you have an allergy to CHG or antibacterial soaps.  If your skin becomes reddened/irritated stop using the CHG and inform your nurse when you arrive at Short Stay. Do not shave (including legs and underarms) for at least 48 hours prior to the first CHG shower.  You may shave your  face/neck. Please follow these instructions carefully:  1.  Shower with CHG Soap the night before surgery and the  morning of Surgery.  2.  If you choose to wash your hair, wash your hair first as usual with your  normal  shampoo.  3.  After you shampoo, rinse your hair and body thoroughly to remove the  shampoo.                           4.  Use CHG as you would any other liquid soap.  You can apply chg directly  to the skin and wash                       Gently with a scrungie or clean washcloth.  5.  Apply the CHG Soap  to your body ONLY FROM THE NECK DOWN.   Do not use on face/ open                           Wound or open sores. Avoid contact with eyes, ears mouth and genitals (private parts).                       Wash face,  Genitals (private parts) with your normal soap.             6.  Wash thoroughly, paying special attention to the area where your surgery  will be performed.  7.  Thoroughly rinse your body with warm water from the neck down.  8.  DO NOT shower/wash with your normal soap after using and rinsing off  the CHG Soap.                9.  Pat yourself dry with a clean towel.            10.  Wear clean pajamas.            11.  Place clean sheets on your bed the night of your first shower and do not  sleep with pets. Day of Surgery : Do not apply any lotions/deodorants the morning of surgery.  Please wear clean clothes to the hospital/surgery center.  FAILURE TO FOLLOW THESE INSTRUCTIONS MAY RESULT IN THE CANCELLATION OF YOUR SURGERY PATIENT SIGNATURE_________________________________  NURSE SIGNATURE__________________________________  ________________________________________________________________________  WHAT IS A BLOOD TRANSFUSION? Blood Transfusion Information  A transfusion is the replacement of blood or some of its parts. Blood is made up of multiple cells which provide different functions.  Red blood cells carry oxygen and are used for blood loss  replacement.  White blood cells fight against infection.  Platelets control bleeding.  Plasma helps clot blood.  Other blood products are available for specialized needs, such as hemophilia or other clotting disorders. BEFORE THE TRANSFUSION  Who gives blood for transfusions?   Healthy volunteers who are fully evaluated to make sure their blood is safe. This is blood bank blood. Transfusion therapy is the safest it has ever been in the practice of medicine. Before blood is taken from a donor, a complete history is taken to make sure that person has no history of diseases nor engages in risky social behavior (examples are intravenous drug use or sexual activity with multiple partners). The donor's travel history is screened to minimize risk of transmitting infections, such as malaria. The donated blood is tested for signs of infectious diseases, such as HIV and hepatitis. The blood is then tested to be sure it is compatible with you in order to minimize the chance of a transfusion reaction. If you or a relative donates blood, this is often done in anticipation of surgery and is not appropriate for emergency situations. It takes many days to process the donated blood. RISKS AND COMPLICATIONS Although transfusion therapy is very safe and saves many lives, the main dangers of transfusion include:   Getting an infectious disease.  Developing a transfusion reaction. This is an allergic reaction to something in the blood you were given. Every precaution is taken to prevent this. The decision to have a blood transfusion has been considered carefully by your caregiver before blood is given. Blood is not given unless the benefits outweigh the risks. AFTER THE TRANSFUSION  Right after receiving a blood transfusion, you will  usually feel much better and more energetic. This is especially true if your red blood cells have gotten low (anemic). The transfusion raises the level of the red blood cells which  carry oxygen, and this usually causes an energy increase.  The nurse administering the transfusion will monitor you carefully for complications. HOME CARE INSTRUCTIONS  No special instructions are needed after a transfusion. You may find your energy is better. Speak with your caregiver about any limitations on activity for underlying diseases you may have. SEEK MEDICAL CARE IF:   Your condition is not improving after your transfusion.  You develop redness or irritation at the intravenous (IV) site. SEEK IMMEDIATE MEDICAL CARE IF:  Any of the following symptoms occur over the next 12 hours:  Shaking chills.  You have a temperature by mouth above 102 F (38.9 C), not controlled by medicine.  Chest, back, or muscle pain.  People around you feel you are not acting correctly or are confused.  Shortness of breath or difficulty breathing.  Dizziness and fainting.  You get a rash or develop hives.  You have a decrease in urine output.  Your urine turns a dark color or changes to pink, red, or brown. Any of the following symptoms occur over the next 10 days:  You have a temperature by mouth above 102 F (38.9 C), not controlled by medicine.  Shortness of breath.  Weakness after normal activity.  The white part of the eye turns yellow (jaundice).  You have a decrease in the amount of urine or are urinating less often.  Your urine turns a dark color or changes to pink, red, or brown. Document Released: 05/05/2000 Document Revised: 07/31/2011 Document Reviewed: 12/23/2007 ExitCare Patient Information 2014 [place].  _______________________________________________________________________  Incentive Spirometer  An incentive spirometer is a tool that can help keep your lungs clear and active. This tool measures how well you are filling your lungs with each breath. Taking long deep breaths may help reverse or decrease the chance of developing breathing (pulmonary) problems  (especially infection) following:  A long period of time when you are unable to move or be active. BEFORE THE PROCEDURE   If the spirometer includes an indicator to show your best effort, your nurse or respiratory therapist will set it to a desired goal.  If possible, sit up straight or lean slightly forward. Try not to slouch.  Hold the incentive spirometer in an upright position. INSTRUCTIONS FOR USE  1. Sit on the edge of your bed if possible, or sit up as far as you can in bed or on a chair. 2. Hold the incentive spirometer in an upright position. 3. Breathe out normally. 4. Place the mouthpiece in your mouth and seal your lips tightly around it. 5. Breathe in slowly and as deeply as possible, raising the piston or the ball toward the top of the column. 6. Hold your breath for 3-5 seconds or for as long as possible. Allow the piston or ball to fall to the bottom of the column. 7. Remove the mouthpiece from your mouth and breathe out normally. 8. Rest for a few seconds and repeat Steps 1 through 7 at least 10 times every 1-2 hours when you are awake. Take your time and take a few normal breaths between deep breaths. 9. The spirometer may include an indicator to show your best effort. Use the indicator as a goal to work toward during each repetition. 10. After each set of 10 deep breaths, practice coughing  to be sure your lungs are clear. If you have an incision (the cut made at the time of surgery), support your incision when coughing by placing a pillow or rolled up towels firmly against it. Once you are able to get out of bed, walk around indoors and cough well. You may stop using the incentive spirometer when instructed by your caregiver.  RISKS AND COMPLICATIONS  Take your time so you do not get dizzy or light-headed.  If you are in pain, you may need to take or ask for pain medication before doing incentive spirometry. It is harder to take a deep breath if you are having  pain. AFTER USE  Rest and breathe slowly and easily.  It can be helpful to keep track of a log of your progress. Your caregiver can provide you with a simple table to help with this. If you are using the spirometer at home, follow these instructions: [place] IF:   You are having difficultly using the spirometer.  You have trouble using the spirometer as often as instructed.  Your pain medication is not giving enough relief while using the spirometer.  You develop fever of 100.5 F (38.1 C) or higher. SEEK IMMEDIATE MEDICAL CARE IF:   You cough up bloody sputum that had not been present before.  You develop fever of 102 F (38.9 C) or greater.  You develop worsening pain at or near the incision site. MAKE SURE YOU:   Understand these instructions.  Will watch your condition.  Will get help right away if you are not doing well or get worse. Document Released: 09/18/2006 Document Revised: 07/31/2011 Document Reviewed: 11/19/2006 [facility] Patient Information 2014 [place], [place].   ________________________________________________________________________

## 2016-04-17 NOTE — Telephone Encounter (Signed)
Make the patient aware that his blood pressure medication should be taken on the morning of surgery with a small sip of water. If he did this, we should increase metoprolol succinate to 100 mg daily.  He will need an office visit with an APP to document excellent blood pressure control prior to rescheduling surgery. This office as it should be at least one week after medication adjustment noted above. If he did not take his medication prior to surgery, we need to document an office blood pressure less than or equal to 140/90 mmHg prior to clearing him for surgery.

## 2016-04-17 NOTE — Telephone Encounter (Signed)
David Masse, RN at 04/17/2016 1:59 PM   Status: Signed    Blood pressure at 1300 pm at time of preop appointment was 153/110 in right arm.   At 1320 pm right arm- 153/101.  Patient denies any dizziness , lightheadedness or chest pain.  1322pm- 146/112 in right arm.  left arn- 165/114.  At 1340pm- 142/104 in left arm and 158/111 in right arm..  1350pm- 176/106.  Dr Kalman Shan ( anesthesia ) made aware.  I told Dr Kalman Shan I would notify PCP-  Dr Verdell Face and Dr Tamala Julian ( cardiologist).  Dr Kalman Shan stated patient may need medication adjustmetn prior to surgery.          I spoke with Florentina Jenny and she wanted to make Dr Tamala Julian aware that the pt is scheduled for knee surgery on 04/24/16 and his BP is elevated.  Anesthesia felt like the pt's medications should be adjusted.

## 2016-04-17 NOTE — Progress Notes (Signed)
Called office of Dr Verdell Face ( they were at lunch until 1430pm) and spoke with office staff.  Office staff saw fax sent to Dr Verdell Face regarding blood pressure readings and they are aware I also informed Dr Daneen Schick office [facility] nurse and epic note)  of blood pressure readings.  Patient is aware that I was going to call both offices and report blood pressure readings. Office staff of Dr Verdell Face stated they would make sure Dr Laqueta Due receives fax.

## 2016-04-17 NOTE — Telephone Encounter (Signed)
Left message to call back  

## 2016-04-17 NOTE — Telephone Encounter (Signed)
[facility] ) is calling

## 2016-04-17 NOTE — Progress Notes (Signed)
Spoke with Triage Nurse, Lauren at cardiology office of Dr Daneen Schick and she stated she would let Dr Daneen Schick be aware of blood pressure readings.

## 2016-04-18 NOTE — Telephone Encounter (Signed)
Yes, the Friday APP appointment is a good idea.

## 2016-04-18 NOTE — Telephone Encounter (Signed)
Follow Up:     Returning David Chang call from yesterday.

## 2016-04-18 NOTE — Telephone Encounter (Signed)
Patient was at a PREOP visit yesterday when his BP was high.  He had taken all BP meds prior. He has a lot of knee pain which he was having yesterday.  After the preop appt, he went to the gym and worked out and last night his BP was 130/75.  This AM it was 145/90.  His HR currently is 82.  Advised him this am to take additional 50 mg Toprol, and to begin 100 mg every AM tomorrow.      Discussed that his surgery may need postponed per Dr. Tamala Julian due to BP.   Pt states the surgeon is 6 months out with surgeries and they had to work to get him in at this time and he already had to wait a year before he could come off his antiplatelet therapy.   He is scheduled for Mon, Dec 4.     Advised patient to continue to monitor BP, and I would ask Dr. Tamala Julian if patient could be seen Friday by APP so that he could possibly stay scheduled for surgery Monday.  Pt would like return call on his cell: [phone].

## 2016-04-19 NOTE — Telephone Encounter (Signed)
Notified Mr. Dollard that Dr. Tamala Julian suggested he see a PA.  Made him an appointment w/B. Bhagat,PA on 11/30 at 3:30.  He states after he increased the Toprol his BP last PM was 120/81.  He verbalizes understanding regarding appointment.

## 2016-04-19 NOTE — Progress Notes (Deleted)
Cardiology Office Note    Date:  04/19/2016   ID:  David Chang, DOB Jun 02, 1960, MRN [id number]  PCP:  Ernestene Kiel, MD  Cardiologist:  Dr. Tamala Julian  Chief Complaint: blood pressure check   History of Present Illness:   David Chang is a 55 y.o. male with hx of CAD with multivessel DES including RCA, circumflex, and LAD, hyperlipidemia, HLD, ICM who presented for BP check.   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 EKG revealed diagnostic inferior ST elevation with reciprocal ST depression in 1 and aVL. Cardiac cath revealed 3 vessel disease and stents were placed to the [place], pRCA and mLCX.Residual disease of mRCA.  Last echo 04/08/15 showed LV EF of 45-50%, moderate LVH, grade 1 DD, mild AI, mild MR.   PT was doing well on cardiac stand point when last seen by Dr. Tamala Julian 02/17/16. Recently elevated BP and upcoming Knee surgery leading to presentation today.      Past Medical History:  Diagnosis Date  . Arthritis   . Coronary artery disease   . Hyperlipidemia LDL goal <70 04/10/2015  . Hypertension   . Ischemic cardiomyopathy 04/10/2015  . S/P angioplasty with stent to mLAD DES, pRCA DES, mLCX DES 04/08/15  04/10/2015    Past Surgical History:  Procedure Laterality Date  . CARDIAC CATHETERIZATION N/A 04/08/2015   Procedure: Left Heart Cath and Coronary Angiography;  Surgeon: Leonie Man, MD;  Location: [facility];  Service: Cardiovascular;  Laterality: N/A;  . KNEE SURGERY Left   . left tricep surgery       Current Medications: Prior to Admission medications   Medication Sig Start Date End Date Taking? Authorizing Provider  acetaminophen (TYLENOL) 325 MG tablet Take 2 tablets (650 mg total) by mouth every 4 (four) hours as needed for headache or mild pain. 04/10/15   Isaiah Serge, NP  amLODipine  (NORVASC) 10 MG tablet TAKE ONE TABLET BY MOUTH ONCE DAILY 11/03/15   Isaiah Serge, NP  aspirin 81 MG chewable tablet Chew 1 tablet (81 mg total) by mouth daily. 04/10/15   Isaiah Serge, NP  clopidogrel (PLAVIX) 75 MG tablet Take 1 tablet (75 mg total) by mouth daily. 03/22/16   Belva Crome, MD  losartan (COZAAR) 100 MG tablet Take 100 mg by mouth daily. 02/05/16   Historical Provider, MD  metoprolol succinate (TOPROL-XL) 50 MG 24 hr tablet Take 1 tablet (50 mg total) by mouth daily. Take with or immediately following a meal. 06/17/15   Belva Crome, MD  milk thistle 175 MG tablet Take 175 mg by mouth daily.    Historical Provider, MD  nitroGLYCERIN (NITROSTAT) 0.4 MG SL tablet Place 1 tablet (0.4 mg total) under the tongue every 5 (five) minutes as needed for chest pain. 04/10/15   Isaiah Serge, NP  Omega-3 Fatty Acids (FISH OIL PO) Take 1 capsule by mouth daily.     Historical Provider, MD    Allergies:   Patient has no known allergies.   Social History   Social History  . Marital status: Married    Spouse name: N/A  . Number of children: N/A  . Years of education: N/A   Social History Main Topics  . Smoking status: Never Smoker  . Smokeless tobacco: Never Used  . Alcohol use 0.0 oz/week  Comment: casual drinking on Saturday   . Drug use: No  . Sexual activity: Yes   Other Topics Concern  . Not on file   Social History Narrative  . No narrative on file     Family History:  The patient's family history includes CVA in his other; Cancer (age of onset: 41) in his father; Heart attack (age of onset: 33) in his paternal grandfather; Hypertension in his sister. ***  ROS:   Please see the history of present illness.    ROS All other systems reviewed and are negative.   PHYSICAL EXAM:   VS:  There were no vitals taken for this visit.   GEN: Well nourished, well developed, in no acute distress  HEENT: normal  Neck: no JVD, carotid bruits, or masses Cardiac: ***RRR; no  murmurs, rubs, or gallops,no edema  Respiratory:  clear to auscultation bilaterally, normal work of breathing GI: soft, nontender, nondistended, + BS MS: no deformity or atrophy  Skin: warm and dry, no rash Neuro:  Alert and Oriented x 3, Strength and sensation are intact Psych: euthymic mood, full affect  Wt Readings from Last 3 Encounters:  04/17/16 218 lb (98.9 kg)  02/17/16 216 lb 12.8 oz (98.3 kg)  06/30/15 213 lb 12.8 oz (97 kg)      Studies/Labs Reviewed:   EKG:  EKG is ordered today.  The ekg ordered today demonstrates ***  Recent Labs: 04/17/2016: ALT 40; BUN 31; Creatinine, Ser 1.67; Hemoglobin 17.9; Platelets 182; Potassium 4.7; Sodium 140   Lipid Panel    Component Value Date/Time   CHOL 251 (H) 04/08/2015 0545   TRIG 124 04/08/2015 0545   HDL 23 (L) 04/08/2015 0545   CHOLHDL 10.9 04/08/2015 0545   VLDL 25 04/08/2015 0545   LDLCALC 203 (H) 04/08/2015 0545    Additional studies/ records that were reviewed today include:   Echocardiogram: 04/08/15 LV EF: 45% -   50%  ------------------------------------------------------------------- Indications:      CAD of native vessels 414.01.  ------------------------------------------------------------------- History:   PMH:   Myocardial infarction.  Risk factors: Hypertension.  ------------------------------------------------------------------- Study Conclusions  - Procedure narrative: Transthoracic echocardiography. Image   quality was poor. The study was technically difficult, as a   result of poor acoustic windows, poor sound wave transmission,   restricted patient mobility, and body habitus. - Left ventricle: The cavity size was normal. Wall thickness was   increased in a pattern of moderate LVH. Systolic function was   mildly reduced. The estimated ejection fraction was in the range   of 45% to 50%. Regional wall motion abnormalities cannot be   excluded. Doppler parameters are consistent with abnormal  left   ventricular relaxation (grade 1 diastolic dysfunction). - Aortic valve: There was mild regurgitation. - Mitral valve: There was mild regurgitation. - Left atrium: The atrium was moderately dilated  Cardiac Catheterization:  04/08/15 Conclusion   1. Severe 3 vessel CAD with 3 successful PCI. 2. Mid LAD lesion, 90% stenosed. PCI with a Promus Premier DES 3.5 mm x 16 mm (3.8 mm). Post intervention, there is a 0% residual stenosis. 3. Prox RCA lesion, 90% stenosed. PCI with a Promus Premier DES 4.0 mm x 20 mm (4.3 mm). Post intervention, there is a 0% residual stenosis. 4. Mid Cx lesion, 95% stenosed. PCI with a Promus Premier DES 3.0 mm x 16 mm (3.4 mm). Post intervention, there is a 0% residual stenosis. 5. Mid RCA lesion, 45% stenosed. 6. Mildly elevated LVEDP  Successful 3 vessel PCI providing complete revascularization following non-ST elevation MI. Left ventricular if he was not performed in order to conserve contrast. An echocardiogram has been ordered.  Plan:  Transfer to TCU from [facility] for post radial PCI care.  He has 2 tandem TR bands that should be removed together per protocol.  Dual antiplatelet therapy for minimum one year. After which point I would continue essentially lifelong antiplatelet coverage with Plavix alone.  P2Y12 inhibition assay ordered as the patient was started on Plavix last night  Restart Bystolic.  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   1. HTN  2. CAD s/p DES to mLAD, pRCA, and mLCx (04/08/15)  3. ICM - Mildly decreased EF, 45-50% 03/2015.Euvolemic.  Continue BB and ARB.  4. HLD  - Due to labs. Continue statin.    Medication Adjustments/Labs and Tests Ordered: Current medicines are reviewed at length with the patient today.  Concerns regarding medicines are outlined above.  Medication changes, Labs and Tests ordered today are listed in the Patient Instructions below. There are no Patient Instructions on file for this visit.   Jarrett Soho, [place]  04/19/2016 11:52 AM    [facility] [address], [place], [place]  [address] Phone: [phone]; Fax: [phone]

## 2016-04-20 ENCOUNTER — Ambulatory Visit: Payer: BLUE CROSS/BLUE SHIELD | Admitting: Physician Assistant

## 2016-04-20 ENCOUNTER — Ambulatory Visit (INDEPENDENT_AMBULATORY_CARE_PROVIDER_SITE_OTHER): Payer: BLUE CROSS/BLUE SHIELD | Admitting: Physician Assistant

## 2016-04-20 ENCOUNTER — Ambulatory Visit: Payer: Self-pay | Admitting: Orthopedic Surgery

## 2016-04-20 ENCOUNTER — Encounter: Payer: Self-pay | Admitting: Physician Assistant

## 2016-04-20 VITALS — BP 130/90 | HR 80 | Ht 72.0 in | Wt 233.0 lb

## 2016-04-20 DIAGNOSIS — I10 Essential (primary) hypertension: Secondary | ICD-10-CM | POA: Diagnosis not present

## 2016-04-20 DIAGNOSIS — E785 Hyperlipidemia, unspecified: Secondary | ICD-10-CM

## 2016-04-20 DIAGNOSIS — I255 Ischemic cardiomyopathy: Secondary | ICD-10-CM

## 2016-04-20 DIAGNOSIS — I5042 Chronic combined systolic (congestive) and diastolic (congestive) heart failure: Secondary | ICD-10-CM

## 2016-04-20 NOTE — Patient Instructions (Signed)
Your physician recommends that you continue on your current medications as directed. Please refer to the Current Medication list given to you today.   Your physician recommends that you schedule a follow-up appointment in: AS SCHEDULED  

## 2016-04-20 NOTE — Progress Notes (Signed)
Cardiology Office Note    Date:  04/20/2016   ID:  David Chang, DOB 1961-01-10, MRN [id number]  PCP:  Ernestene Kiel, MD  Cardiologist:  Dr. Tamala Julian  Chief Complaint: blood   History of Present Illness:   David Chang is a 55 y.o. male with hx of CAD s/p DES x1 to mLAD, DES x1 to [facility] and DES x1 to mLCx (03/2015),  ICM with EF of 45-50%, HTN and HLD who presented for bp follow up.   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 EKG revealed diagnostic inferior ST elevation with reciprocal ST depression in 1 and aVL. Cardiac cath revealed 3 vessel disease and stents were placed to the [place], pRCA and mLCX.Residual disease of mRCA. On Echo his EF 45-50% G1DD and poor acoustic windows. Lt. Atrium mildly dilated.  He was doing well on cardiac stand point when last seen by Dr. Tamala Julian 01/2016. He recently went for pre-op clearance 04/17/16 of L total knee replacement and noted BP of 153/110. His Troprol-XL increased to 100mg  qd.   Here today for follow up. BP now in 120-130/80-90. The patient denies nausea, vomiting, fever, chest pain, palpitations, shortness of breath, orthopnea, PND, dizziness, syncope, cough, congestion, abdominal pain, hematochezia, melena, lower extremity edema. BP goal of less than or equal to 140/90.    Past Medical History:  Diagnosis Date  . Arthritis   . Coronary artery disease   . Hyperlipidemia LDL goal <70 04/10/2015  . Hypertension   . Ischemic cardiomyopathy 04/10/2015  . S/P angioplasty with stent to mLAD DES, pRCA DES, mLCX DES 04/08/15  04/10/2015    Past Surgical History:  Procedure Laterality Date  . CARDIAC CATHETERIZATION N/A 04/08/2015   Procedure: Left Heart Cath and Coronary Angiography;  Surgeon: Leonie Man, MD;  Location: [facility];  Service: Cardiovascular;  Laterality: N/A;  .  KNEE SURGERY Left   . left tricep surgery       Current Medications: Prior to Admission medications   Medication Sig Start Date End Date Taking? Authorizing Provider  acetaminophen (TYLENOL) 325 MG tablet Take 2 tablets (650 mg total) by mouth every 4 (four) hours as needed for headache or mild pain. 04/10/15  Yes Isaiah Serge, NP  amLODipine (NORVASC) 10 MG tablet TAKE ONE TABLET BY MOUTH ONCE DAILY 11/03/15  Yes Isaiah Serge, NP  aspirin 81 MG chewable tablet Chew 1 tablet (81 mg total) by mouth daily. 04/10/15  Yes Isaiah Serge, NP  clopidogrel (PLAVIX) 75 MG tablet Take 1 tablet (75 mg total) by mouth daily. 03/22/16  Yes Belva Crome, MD  losartan (COZAAR) 100 MG tablet Take 100 mg by mouth daily. 02/05/16  Yes Historical Provider, MD  metoprolol succinate (TOPROL-XL) 100 MG 24 hr tablet Take 100 mg by mouth daily. Take with or immediately following a meal.   Yes Historical Provider, MD  milk thistle 175 MG tablet Take 175 mg by mouth daily.   Yes Historical Provider, MD  nitroGLYCERIN (NITROSTAT) 0.4 MG SL tablet Place 1 tablet (0.4 mg total) under the tongue every 5 (five) minutes as needed for chest pain. 04/10/15  Yes Isaiah Serge, NP  Omega-3 Fatty Acids (FISH OIL PO) Take 1 capsule by mouth daily.    Yes Historical Provider, MD    Allergies:   Patient has no known allergies.  Social History   Social History  . Marital status: Married    Spouse name: N/A  . Number of children: N/A  . Years of education: N/A   Social History Main Topics  . Smoking status: Never Smoker  . Smokeless tobacco: Never Used  . Alcohol use 0.0 oz/week     Comment: casual drinking on Saturday   . Drug use: No  . Sexual activity: Yes   Other Topics Concern  . None   Social History Narrative  . None     Family History:  The patient's family history includes CVA in his other; Cancer (age of onset: 74) in his father; Heart attack (age of onset: 25) in his paternal grandfather;  Hypertension in his sister.   ROS:   Please see the history of present illness.    ROS All other systems reviewed and are negative.   PHYSICAL EXAM:   VS:  BP 130/90   Pulse 80   Ht 6' (1.829 m)   Wt 233 lb (105.7 kg)   BMI 31.60 kg/m    GEN: Well nourished, well developed, in no acute distress  HEENT: normal  Neck: no JVD, carotid bruits, or masses Cardiac:RRR; no murmurs, rubs, or gallops,no edema  Respiratory:  clear to auscultation bilaterally, normal work of breathing GI: soft, nontender, nondistended, + BS MS: no deformity or atrophy  Skin: warm and dry, no rash Neuro:  Alert and Oriented x 3, Strength and sensation are intact Psych: euthymic mood, full affect  Wt Readings from Last 3 Encounters:  04/20/16 233 lb (105.7 kg)  04/17/16 218 lb (98.9 kg)  02/17/16 216 lb 12.8 oz (98.3 kg)      Studies/Labs Reviewed:   EKG:  EKG is not ordered today.    Recent Labs: 04/17/2016: ALT 40; BUN 31; Creatinine, Ser 1.67; Hemoglobin 17.9; Platelets 182; Potassium 4.7; Sodium 140   Lipid Panel    Component Value Date/Time   CHOL 251 (H) 04/08/2015 0545   TRIG 124 04/08/2015 0545   HDL 23 (L) 04/08/2015 0545   CHOLHDL 10.9 04/08/2015 0545   VLDL 25 04/08/2015 0545   LDLCALC 203 (H) 04/08/2015 0545    Additional studies/ records that were reviewed today include:   As above    ASSESSMENT & PLAN:    1. HTN - improved with medication changes. Today BP of 130/90. He was outside all days. He will let us know if BP constantly above 140/90s.  2. CAD s/p DES to mLAD, pRCA and mLCx - No anginal pain. On hold ASA and plavix for surgery 04/24/16. Last dose 04/17/16.   3. ICM - Ef of 45-50% on echo. No signs of CHF. Continue BB and ARB.  4. HLD - followed by PCP  Medication Adjustments/Labs and Tests Ordered: Current medicines are reviewed at length with the patient today.  Concerns regarding medicines are outlined above.  Medication changes, Labs and Tests ordered  today are listed in the Patient Instructions below. Patient Instructions  Your physician recommends that you continue on your current medications as directed. Please refer to the Current Medication list given to you today.   Your physician recommends that you schedule a follow-up appointment in:  AS Gwen Pounds, [place]  04/20/2016 3:19 PM    [address], [place], [place]  [address] Phone: [phone]; Fax: [phone]

## 2016-04-23 ENCOUNTER — Ambulatory Visit: Payer: Self-pay | Admitting: Orthopedic Surgery

## 2016-04-23 NOTE — H&P (Signed)
David Chang DOB: 12/15/1960 Married / Language: English / Race: White Male Date of Admission:  04/24/2016 CC:  Left Knee Pain History of Present Illness The patient is a 55 year old male who comes in  for a preoperative History and Physical. The patient is scheduled for a left total knee arthroplasty to be performed by Dr. Dione Plover. Aluisio, MD at [facility] on 04-24-2016. The patient is a 55 year old male who presented with knee complaints. The patient was seen for a second opinion. The patient reports left knee (worse than right) symptoms including: pain, swelling, stiffness, soreness and grinding which began year(s) ago without any known injury (hx of ACL, MCL, and PCL injury in college). The patient describes the severity of the symptoms as severe.The patient feels that the symptoms are worsening (especially over the three months). The patient has the current diagnosis of knee osteoarthritis. Prior to being seen, the patient was previously evaluated by a colleague David Ishihara Hubler, DO). Previous work-up for this problem has included knee x-rays. Past treatment for this problem has included intra-articular injection of corticosteroids (also had Synvisc One about three months ago). Note for "Knee pain": He states that he had a heart attack in November of last year. He had three stents put in in at that time, and could not get clearance for surgery until now David Chang comes in concerning the left knee. He had heard about David Chang and wanted to be evaluated for a possible knee replacement. The patient is a 55 year old male who injured his left knee back in college when he was playing basketball at [employer]. He had an ACL tear, MCL and PCL injury. He had his ACL repaired and unfortunately six years later, he tore his ACL again, so he has had ACL deficient knee for quite some time now. Both knees are hurting but the left knee is definitely most symptomatic and problematic of the two. He has  significant pain and swelling with it, has a lot of stiffness with any kind of sedentary sitting, soreness and grinding. It has gotten worse for the past three months and he has been wanting to get it evaluated. He has had x-rays done. He has had cortisone injections in the past, which only helped temporarily. He did the Synvisc-One gel injection, which only lasted about three months. He has been followed by David Chang in [place]. This is where he got the cortisone and gel injections. Again, for the past three to four months, it has gotten much worse. He is limping more and more now and has to wear pads in his shoe because of the malalignment of his knee. It is constantly swelling and cannot bend it well. He does have a significant history of cardiac disease. He had a heart attack back in November 2015 and had three stents due to a coronary arterial disease. He had to wait at least a year following his stent placement. He is followed by David Chang from a cardiology standpoint, referred him over for evaluation. He has been cleared from a cardiac standpoint, now wishes to proceed with evaluation and probable surgery. They have been treated conservatively in the past for the above stated problem and despite conservative measures, they continue to have progressive pain and severe functional limitations and dysfunction. They have failed non-operative management including home exercise, medications, and injections. It is felt that they would benefit from undergoing total joint replacement. Risks and benefits of the procedure have been discussed  with the patient and they elect to proceed with surgery. There are no active contraindications to surgery such as ongoing infection or rapidly progressive neurological disease.   Problem List/Past Medical Tricompartment osteoarthritis of left knee (M17.12)  High blood pressure  Myocardial infarction  Allergies No Known Drug Allergies   Family History Cancer   Father. Congestive Heart Failure  Brother, Maternal Grandmother, Paternal Grandfather. First Degree Relatives  reported Heart disease in male family member before age 36  Hypertension  Brother, Sister. Liver Disease, Chronic  Brother. Osteoarthritis  Mother.  Social History Children  2 Current drinker  03/17/2016: Currently drinks beer only occasionally per week Current work status  working full time Exercise  Exercises daily; does gym / [employer] Living situation  live with spouse Marital status  married No history of drug/alcohol rehab  Not under pain contract  Number of flights of stairs before winded  greater than 5 Tobacco / smoke exposure  03/17/2016: no Tobacco use  Never smoker. 03/17/2016  Medication History Losartan Potassium (100MG  Tablet, Oral) Active. Pennsaid (2% Solution, Transdermal) Active. Clopidogrel Bisulfate (75MG  Tablet, Oral) Active. AmLODIPine Besylate (10MG  Tablet, Oral) Active. Metoprolol Succinate ER (50MG  Tablet ER 24HR, Oral) Active. Acetaminophen (325MG  Tablet, Oral) Active. Aspirin (81MG  Tablet, Oral) Active. Nitroglycerin (0.4MG Dorita Fray Solution, Translingual) Active. Fish Oil (Oral) Specific strength unknown - Active.  Past Surgical History Heart Stents  3 Bilateral Tricep Tendon Surgery  Left Knee ACL Reconstruction  Date: 1982.    Review of Systems General Not Present- Chills, Fatigue, Fever, Memory Loss, Night Sweats, Weight Gain and Weight Loss. Skin Not Present- Eczema, Hives, Itching, Lesions and Rash. HEENT Not Present- Dentures, Double Vision, Headache, Hearing Loss, Tinnitus and Visual Loss. Respiratory Not Present- Allergies, Chronic Cough, Coughing up blood, Shortness of breath at rest and Shortness of breath with exertion. Cardiovascular Not Present- Chest Pain, Difficulty Breathing Lying Down, Murmur, Palpitations, Racing/skipping heartbeats and Swelling. Gastrointestinal Not Present- Abdominal Pain,  Bloody Stool, Constipation, Diarrhea, Difficulty Swallowing, Heartburn, Jaundice, Loss of appetitie, Nausea and Vomiting. Male Genitourinary Present- Urinating at Night. Not Present- Blood in Urine, Discharge, Flank Pain, Incontinence, Painful Urination, Urgency, Urinary frequency, Urinary Retention and Weak urinary stream. Musculoskeletal Present- Joint Pain. Not Present- Back Pain, Joint Swelling, Morning Stiffness, Muscle Pain, Muscle Weakness and Spasms. Neurological Not Present- Blackout spells, Difficulty with balance, Dizziness, Paralysis, Tremor and Weakness. Psychiatric Not Present- Insomnia.  Vitals  Weight: 228 lb Height: 72in Weight was reported by patient. Height was reported by patient. Body Surface Area: 2.25 m Body Mass Index: 30.92 kg/m  Pulse: 92 (Regular)  BP: 150/98 (Sitting, Right Arm, Standard)   Physical Exam General Mental Status -Alert, cooperative and good historian. General Appearance-pleasant, Not in acute distress. Orientation-Oriented X3. Build & Nutrition-Well nourished and Well developed.  Head and Neck Head-normocephalic, atraumatic . Neck Global Assessment - supple, no bruit auscultated on the right, no bruit auscultated on the left.  Eye Pupil - Bilateral-Regular and Round. Motion - Bilateral-EOMI.  Chest and Lung Exam Auscultation Breath sounds - clear at anterior chest wall and clear at posterior chest wall. Adventitious sounds - No Adventitious sounds.  Cardiovascular Auscultation Rhythm - Regular rate and rhythm. Heart Sounds - S1 WNL and S2 WNL. Murmurs & Other Heart Sounds - Auscultation of the heart reveals - No Murmurs.  Abdomen Palpation/Percussion Tenderness - Abdomen is non-tender to palpation. Rigidity (guarding) - Abdomen is soft. Auscultation Auscultation of the abdomen reveals - Bowel sounds normal.  Male Genitourinary Note: Not done, not pertinent to  present illness   Musculoskeletal Note:  The patient is a 55 year old white male, muscular build, tall frame. Left knee is examined. He has about a 15 degree valgus malalignment deformity on range of motion. He has actually full extension and flexion back to 135 passively. He can get about 130 actively. He has significant marked crepitus on passive range of motion. No positive effusion. Tender over the medial and lateral joint lines. He does have a little bit of instability and pseudolaxity with the knee coming back to alignment on varus stressing against the valgus malalignment.  RADIOGRAPHS X-rays taken today, AP and lateral view of the left knee which show severe arthritis in the lateral compartment with complete loss of the joint space. He has got a malalignment of valgus, malalignment with lateral translocation of the tibia laterally in relation to the femur. He has massive spurring on the femoral side. Lateral view shows complete bone-on-bone in the weightbearing surface with large posterior osteophytes. Significant degenerative changes in patellofemoral compartment, noted to be already bone-on-bone.   Assessment & Plan Tricompartment osteoarthritis of left knee (M17.12)  Note:Surgical Plans: Left Total Knee Replacement  Disposition: Home  PCP: Dr. Laqueta Due - Patient has been seen preoperatively and felt to be stable for surgery. Cards: Dr. Tamala Julian - Patient has been seen preoperatively and felt to be stable for surgery.  Topical TXA - MI  Anesthesia Issues: None  Signed electronically by Ok Edwards, III PA-C

## 2016-04-24 ENCOUNTER — Encounter (HOSPITAL_COMMUNITY): Payer: Self-pay | Admitting: *Deleted

## 2016-04-24 ENCOUNTER — Encounter (HOSPITAL_COMMUNITY)
Admission: RE | Disposition: A | Discharge status: Home Health | Payer: Self-pay | Source: Ambulatory Visit | Attending: Orthopedic Surgery

## 2016-04-24 ENCOUNTER — Inpatient Hospital Stay (HOSPITAL_COMMUNITY): Payer: BLUE CROSS/BLUE SHIELD | Admitting: Anesthesiology

## 2016-04-24 ENCOUNTER — Inpatient Hospital Stay (HOSPITAL_COMMUNITY)
Admission: RE | Admit: 2016-04-24 | Discharge: 2016-04-25 | DRG: 470 | Disposition: A | Discharge status: Home Health | Payer: BLUE CROSS/BLUE SHIELD | Source: Ambulatory Visit | Attending: Orthopedic Surgery | Admitting: Orthopedic Surgery

## 2016-04-24 DIAGNOSIS — Z955 Presence of coronary angioplasty implant and graft: Secondary | ICD-10-CM

## 2016-04-24 DIAGNOSIS — Z8249 Family history of ischemic heart disease and other diseases of the circulatory system: Secondary | ICD-10-CM | POA: Diagnosis not present

## 2016-04-24 DIAGNOSIS — M179 Osteoarthritis of knee, unspecified: Secondary | ICD-10-CM | POA: Diagnosis present

## 2016-04-24 DIAGNOSIS — I252 Old myocardial infarction: Secondary | ICD-10-CM

## 2016-04-24 DIAGNOSIS — M25562 Pain in left knee: Secondary | ICD-10-CM | POA: Diagnosis not present

## 2016-04-24 DIAGNOSIS — M1712 Unilateral primary osteoarthritis, left knee: Secondary | ICD-10-CM | POA: Diagnosis not present

## 2016-04-24 DIAGNOSIS — M1710 Unilateral primary osteoarthritis, unspecified knee: Secondary | ICD-10-CM | POA: Diagnosis present

## 2016-04-24 DIAGNOSIS — I10 Essential (primary) hypertension: Secondary | ICD-10-CM | POA: Diagnosis present

## 2016-04-24 DIAGNOSIS — I255 Ischemic cardiomyopathy: Secondary | ICD-10-CM | POA: Diagnosis present

## 2016-04-24 DIAGNOSIS — E669 Obesity, unspecified: Secondary | ICD-10-CM | POA: Diagnosis not present

## 2016-04-24 DIAGNOSIS — Z6831 Body mass index (BMI) 31.0-31.9, adult: Secondary | ICD-10-CM | POA: Diagnosis not present

## 2016-04-24 DIAGNOSIS — I2510 Atherosclerotic heart disease of native coronary artery without angina pectoris: Secondary | ICD-10-CM | POA: Diagnosis present

## 2016-04-24 DIAGNOSIS — E785 Hyperlipidemia, unspecified: Secondary | ICD-10-CM | POA: Diagnosis present

## 2016-04-24 HISTORY — PX: TOTAL KNEE ARTHROPLASTY: SHX125

## 2016-04-24 LAB — TYPE AND SCREEN
ABO/RH(D): A POS
ANTIBODY SCREEN: NEGATIVE

## 2016-04-24 SURGERY — ARTHROPLASTY, KNEE, TOTAL
Anesthesia: Monitor Anesthesia Care | Site: Knee | Laterality: Left

## 2016-04-24 MED ORDER — CEFAZOLIN SODIUM-DEXTROSE 2-4 GM/100ML-% IV SOLN
2.0000 g | INTRAVENOUS | Status: AC
  Administered 2016-04-24: 2 g via INTRAVENOUS
  Filled 2016-04-24: qty 100

## 2016-04-24 MED ORDER — DEXAMETHASONE SODIUM PHOSPHATE 10 MG/ML IJ SOLN
INTRAMUSCULAR | Status: AC
  Filled 2016-04-24: qty 1

## 2016-04-24 MED ORDER — BUPIVACAINE LIPOSOME 1.3 % IJ SUSP
20.0000 mL | Freq: Once | INTRAMUSCULAR | Status: DC
  Filled 2016-04-24: qty 20

## 2016-04-24 MED ORDER — LACTATED RINGERS IV SOLN
INTRAVENOUS | Status: DC
  Administered 2016-04-24 (×2): via INTRAVENOUS

## 2016-04-24 MED ORDER — METHOCARBAMOL 500 MG PO TABS
500.0000 mg | ORAL_TABLET | Freq: Four times a day (QID) | ORAL | Status: DC | PRN
  Administered 2016-04-24 – 2016-04-25 (×4): 500 mg via ORAL
  Filled 2016-04-24 (×4): qty 1

## 2016-04-24 MED ORDER — PROMETHAZINE HCL 25 MG/ML IJ SOLN: 6.2500 mg | INTRAMUSCULAR | Status: DC | PRN

## 2016-04-24 MED ORDER — ACETAMINOPHEN 10 MG/ML IV SOLN
1000.0000 mg | Freq: Once | INTRAVENOUS | Status: AC
  Administered 2016-04-24: 1000 mg via INTRAVENOUS
  Filled 2016-04-24: qty 100

## 2016-04-24 MED ORDER — PROPOFOL 10 MG/ML IV BOLUS
INTRAVENOUS | Status: AC
  Filled 2016-04-24: qty 20

## 2016-04-24 MED ORDER — BUPIVACAINE HCL 0.25 % IJ SOLN
INTRAMUSCULAR | Status: DC | PRN
  Administered 2016-04-24: 20 mL

## 2016-04-24 MED ORDER — BUPIVACAINE HCL (PF) 0.25 % IJ SOLN
INTRAMUSCULAR | Status: AC
  Filled 2016-04-24: qty 30

## 2016-04-24 MED ORDER — FENTANYL CITRATE (PF) 100 MCG/2ML IJ SOLN: 25.0000 ug | INTRAMUSCULAR | Status: DC | PRN

## 2016-04-24 MED ORDER — DIPHENHYDRAMINE HCL 12.5 MG/5ML PO ELIX: 12.5000 mg | ORAL_SOLUTION | ORAL | Status: DC | PRN

## 2016-04-24 MED ORDER — MORPHINE SULFATE (PF) 2 MG/ML IV SOLN
1.0000 mg | INTRAVENOUS | Status: DC | PRN
  Administered 2016-04-24 (×3): 1 mg via INTRAVENOUS
  Filled 2016-04-24 (×3): qty 1

## 2016-04-24 MED ORDER — TRANEXAMIC ACID 1000 MG/10ML IV SOLN
2000.0000 mg | Freq: Once | INTRAVENOUS | Status: DC
  Filled 2016-04-24: qty 20

## 2016-04-24 MED ORDER — METOCLOPRAMIDE HCL 5 MG PO TABS: 5.0000 mg | ORAL_TABLET | Freq: Three times a day (TID) | ORAL | Status: DC | PRN

## 2016-04-24 MED ORDER — CHLORHEXIDINE GLUCONATE 4 % EX LIQD: 60.0000 mL | Freq: Once | CUTANEOUS | Status: DC

## 2016-04-24 MED ORDER — MENTHOL 3 MG MT LOZG: 1.0000 | LOZENGE | OROMUCOSAL | Status: DC | PRN

## 2016-04-24 MED ORDER — MIDAZOLAM HCL 5 MG/5ML IJ SOLN
INTRAMUSCULAR | Status: DC | PRN
  Administered 2016-04-24: 2 mg via INTRAVENOUS

## 2016-04-24 MED ORDER — ONDANSETRON HCL 4 MG/2ML IJ SOLN
INTRAMUSCULAR | Status: AC
  Filled 2016-04-24: qty 2

## 2016-04-24 MED ORDER — AMLODIPINE BESYLATE 10 MG PO TABS
10.0000 mg | ORAL_TABLET | Freq: Every day | ORAL | Status: DC
  Administered 2016-04-25: 10 mg via ORAL
  Filled 2016-04-24: qty 1

## 2016-04-24 MED ORDER — PHENOL 1.4 % MT LIQD
1.0000 | OROMUCOSAL | Status: DC | PRN
  Filled 2016-04-24: qty 177

## 2016-04-24 MED ORDER — BUPIVACAINE IN DEXTROSE 0.75-8.25 % IT SOLN
INTRATHECAL | Status: DC | PRN
  Administered 2016-04-24: 2 mL via INTRATHECAL

## 2016-04-24 MED ORDER — SODIUM CHLORIDE 0.9 % IJ SOLN
INTRAMUSCULAR | Status: AC
  Filled 2016-04-24: qty 50

## 2016-04-24 MED ORDER — ACETAMINOPHEN 10 MG/ML IV SOLN
INTRAVENOUS | Status: AC
  Filled 2016-04-24: qty 100

## 2016-04-24 MED ORDER — ONDANSETRON HCL 4 MG/2ML IJ SOLN: 4.0000 mg | Freq: Four times a day (QID) | INTRAMUSCULAR | Status: DC | PRN

## 2016-04-24 MED ORDER — DEXAMETHASONE SODIUM PHOSPHATE 10 MG/ML IJ SOLN
10.0000 mg | Freq: Once | INTRAMUSCULAR | Status: AC
  Administered 2016-04-24: 10 mg via INTRAVENOUS

## 2016-04-24 MED ORDER — PROPOFOL 10 MG/ML IV BOLUS
INTRAVENOUS | Status: AC
  Filled 2016-04-24: qty 40

## 2016-04-24 MED ORDER — CEFAZOLIN SODIUM-DEXTROSE 2-4 GM/100ML-% IV SOLN: 2.0000 g | INTRAVENOUS | Status: DC

## 2016-04-24 MED ORDER — CEFAZOLIN SODIUM-DEXTROSE 2-4 GM/100ML-% IV SOLN
2.0000 g | Freq: Four times a day (QID) | INTRAVENOUS | Status: AC
  Administered 2016-04-24 – 2016-04-25 (×2): 2 g via INTRAVENOUS
  Filled 2016-04-24 (×2): qty 100

## 2016-04-24 MED ORDER — CEFAZOLIN SODIUM-DEXTROSE 2-4 GM/100ML-% IV SOLN
INTRAVENOUS | Status: AC
  Filled 2016-04-24: qty 100

## 2016-04-24 MED ORDER — POLYETHYLENE GLYCOL 3350 17 G PO PACK: 17.0000 g | PACK | Freq: Every day | ORAL | Status: DC | PRN

## 2016-04-24 MED ORDER — FENTANYL CITRATE (PF) 100 MCG/2ML IJ SOLN
INTRAMUSCULAR | Status: AC
  Filled 2016-04-24: qty 2

## 2016-04-24 MED ORDER — LOSARTAN POTASSIUM 50 MG PO TABS
100.0000 mg | ORAL_TABLET | Freq: Every day | ORAL | Status: DC
  Administered 2016-04-25: 100 mg via ORAL
  Filled 2016-04-24: qty 2

## 2016-04-24 MED ORDER — PHENYLEPHRINE HCL 10 MG/ML IJ SOLN
INTRAMUSCULAR | Status: AC
Start: 2016-04-24 — End: 2016-04-24
  Filled 2016-04-24: qty 1

## 2016-04-24 MED ORDER — METHOCARBAMOL 1000 MG/10ML IJ SOLN
500.0000 mg | Freq: Four times a day (QID) | INTRAVENOUS | Status: DC | PRN
  Filled 2016-04-24: qty 5

## 2016-04-24 MED ORDER — BUPIVACAINE LIPOSOME 1.3 % IJ SUSP
INTRAMUSCULAR | Status: DC | PRN
  Administered 2016-04-24: 20 mL

## 2016-04-24 MED ORDER — TRAMADOL HCL 50 MG PO TABS: 50.0000 mg | ORAL_TABLET | Freq: Four times a day (QID) | ORAL | Status: DC | PRN

## 2016-04-24 MED ORDER — OXYCODONE HCL 5 MG PO TABS
5.0000 mg | ORAL_TABLET | ORAL | Status: DC | PRN
  Administered 2016-04-24 – 2016-04-25 (×10): 10 mg via ORAL
  Filled 2016-04-24 (×11): qty 2

## 2016-04-24 MED ORDER — SODIUM CHLORIDE 0.9 % IR SOLN
Status: DC | PRN
  Administered 2016-04-24: 1000 mL

## 2016-04-24 MED ORDER — ASPIRIN EC 325 MG PO TBEC
325.0000 mg | DELAYED_RELEASE_TABLET | Freq: Every day | ORAL | Status: DC
  Administered 2016-04-25: 325 mg via ORAL
  Filled 2016-04-24: qty 1

## 2016-04-24 MED ORDER — LACTATED RINGERS IV SOLN: INTRAVENOUS | Status: DC

## 2016-04-24 MED ORDER — FENTANYL CITRATE (PF) 100 MCG/2ML IJ SOLN
INTRAMUSCULAR | Status: DC | PRN
  Administered 2016-04-24: 100 ug via INTRAVENOUS

## 2016-04-24 MED ORDER — FLEET ENEMA 7-19 GM/118ML RE ENEM: 1.0000 | ENEMA | Freq: Once | RECTAL | Status: DC | PRN

## 2016-04-24 MED ORDER — ACETAMINOPHEN 10 MG/ML IV SOLN: 1000.0000 mg | Freq: Once | INTRAVENOUS | Status: DC

## 2016-04-24 MED ORDER — BUPIVACAINE LIPOSOME 1.3 % IJ SUSP: 20.0000 mL | Freq: Once | INTRAMUSCULAR | Status: DC

## 2016-04-24 MED ORDER — METOCLOPRAMIDE HCL 5 MG/ML IJ SOLN: 5.0000 mg | Freq: Three times a day (TID) | INTRAMUSCULAR | Status: DC | PRN

## 2016-04-24 MED ORDER — ONDANSETRON HCL 4 MG/2ML IJ SOLN
INTRAMUSCULAR | Status: DC | PRN
  Administered 2016-04-24: 4 mg via INTRAVENOUS

## 2016-04-24 MED ORDER — EPHEDRINE SULFATE-NACL 50-0.9 MG/10ML-% IV SOSY
PREFILLED_SYRINGE | INTRAVENOUS | Status: DC | PRN
  Administered 2016-04-24: 5 mg via INTRAVENOUS

## 2016-04-24 MED ORDER — ACETAMINOPHEN 325 MG PO TABS: 650.0000 mg | ORAL_TABLET | Freq: Four times a day (QID) | ORAL | Status: DC | PRN

## 2016-04-24 MED ORDER — CLOPIDOGREL BISULFATE 75 MG PO TABS
75.0000 mg | ORAL_TABLET | Freq: Every day | ORAL | Status: DC
  Administered 2016-04-25: 75 mg via ORAL
  Filled 2016-04-24: qty 1

## 2016-04-24 MED ORDER — NITROGLYCERIN 0.4 MG SL SUBL: 0.4000 mg | SUBLINGUAL_TABLET | SUBLINGUAL | Status: DC | PRN

## 2016-04-24 MED ORDER — METOPROLOL SUCCINATE ER 50 MG PO TB24
100.0000 mg | ORAL_TABLET | Freq: Every day | ORAL | Status: DC
  Administered 2016-04-25: 100 mg via ORAL
  Filled 2016-04-24: qty 2

## 2016-04-24 MED ORDER — PROPOFOL 500 MG/50ML IV EMUL
INTRAVENOUS | Status: DC | PRN
  Administered 2016-04-24: 100 ug/kg/min via INTRAVENOUS

## 2016-04-24 MED ORDER — SODIUM CHLORIDE 0.9 % IJ SOLN
INTRAMUSCULAR | Status: DC | PRN
  Administered 2016-04-24: 30 mL

## 2016-04-24 MED ORDER — DEXAMETHASONE SODIUM PHOSPHATE 10 MG/ML IJ SOLN: 10.0000 mg | Freq: Once | INTRAMUSCULAR | Status: DC

## 2016-04-24 MED ORDER — ACETAMINOPHEN 650 MG RE SUPP: 650.0000 mg | Freq: Four times a day (QID) | RECTAL | Status: DC | PRN

## 2016-04-24 MED ORDER — BISACODYL 10 MG RE SUPP: 10.0000 mg | Freq: Every day | RECTAL | Status: DC | PRN

## 2016-04-24 MED ORDER — DEXAMETHASONE SODIUM PHOSPHATE 10 MG/ML IJ SOLN
10.0000 mg | Freq: Once | INTRAMUSCULAR | Status: AC
  Administered 2016-04-25: 10 mg via INTRAVENOUS
  Filled 2016-04-24: qty 1

## 2016-04-24 MED ORDER — BUPIVACAINE IN DEXTROSE 0.75-8.25 % IT SOLN: INTRATHECAL | Status: DC | PRN

## 2016-04-24 MED ORDER — ACETAMINOPHEN 500 MG PO TABS
1000.0000 mg | ORAL_TABLET | Freq: Four times a day (QID) | ORAL | Status: AC
  Administered 2016-04-24 – 2016-04-25 (×3): 1000 mg via ORAL
  Filled 2016-04-24 (×3): qty 2

## 2016-04-24 MED ORDER — MIDAZOLAM HCL 2 MG/2ML IJ SOLN
INTRAMUSCULAR | Status: AC
  Filled 2016-04-24: qty 2

## 2016-04-24 MED ORDER — ONDANSETRON HCL 4 MG PO TABS
4.0000 mg | ORAL_TABLET | Freq: Four times a day (QID) | ORAL | Status: DC | PRN
  Administered 2016-04-24: 4 mg via ORAL
  Filled 2016-04-24: qty 1

## 2016-04-24 MED ORDER — PHENYLEPHRINE HCL 10 MG/ML IJ SOLN
INTRAVENOUS | Status: DC | PRN
  Administered 2016-04-24: 10 ug/min via INTRAVENOUS

## 2016-04-24 MED ORDER — DOCUSATE SODIUM 100 MG PO CAPS
100.0000 mg | ORAL_CAPSULE | Freq: Two times a day (BID) | ORAL | Status: DC
  Administered 2016-04-24 – 2016-04-25 (×3): 100 mg via ORAL
  Filled 2016-04-24 (×3): qty 1

## 2016-04-24 MED ORDER — SODIUM CHLORIDE 0.9 % IV SOLN
INTRAVENOUS | Status: DC
  Administered 2016-04-24: 15:00:00 via INTRAVENOUS

## 2016-04-24 SURGICAL SUPPLY — 52 items
BAG DECANTER FOR FLEXI CONT (MISCELLANEOUS) ×3 IMPLANT
BAG ZIPLOCK 12X15 (MISCELLANEOUS) ×3 IMPLANT
BANDAGE ACE 6X5 VEL STRL LF (GAUZE/BANDAGES/DRESSINGS) ×3 IMPLANT
BANDAGE ELASTIC 6 VELCRO ST LF (GAUZE/BANDAGES/DRESSINGS) ×3 IMPLANT
BLADE SAG 18X100X1.27 (BLADE) ×3 IMPLANT
BLADE SAW SGTL 11.0X1.19X90.0M (BLADE) ×3 IMPLANT
BOWL SMART MIX CTS (DISPOSABLE) ×3 IMPLANT
CAPT KNEE TOTAL 3 ATTUNE ×3 IMPLANT
CEMENT HV SMART SET (Cement) ×6 IMPLANT
CLOSURE WOUND 1/2 X4 (GAUZE/BANDAGES/DRESSINGS) ×1
CLOTH BEACON ORANGE TIMEOUT ST (SAFETY) ×3 IMPLANT
CUFF TOURN SGL QUICK 34 (TOURNIQUET CUFF) ×2
CUFF TRNQT CYL 34X4X40X1 (TOURNIQUET CUFF) ×1 IMPLANT
DECANTER SPIKE VIAL GLASS SM (MISCELLANEOUS) ×3 IMPLANT
DRAPE U-SHAPE 47X51 STRL (DRAPES) ×3 IMPLANT
DRSG ADAPTIC 3X8 NADH LF (GAUZE/BANDAGES/DRESSINGS) ×3 IMPLANT
DRSG PAD ABDOMINAL 8X10 ST (GAUZE/BANDAGES/DRESSINGS) ×3 IMPLANT
DURAPREP 26ML APPLICATOR (WOUND CARE) ×3 IMPLANT
ELECT REM PT RETURN 9FT ADLT (ELECTROSURGICAL) ×3
ELECTRODE REM PT RTRN 9FT ADLT (ELECTROSURGICAL) ×1 IMPLANT
EVACUATOR 1/8 PVC DRAIN (DRAIN) ×3 IMPLANT
GAUZE SPONGE 4X4 12PLY STRL (GAUZE/BANDAGES/DRESSINGS) ×3 IMPLANT
GLOVE BIO SURGEON STRL SZ7.5 (GLOVE) IMPLANT
GLOVE BIO SURGEON STRL SZ8 (GLOVE) ×3 IMPLANT
GLOVE BIOGEL PI IND STRL 6.5 (GLOVE) IMPLANT
GLOVE BIOGEL PI IND STRL 8 (GLOVE) ×1 IMPLANT
GLOVE BIOGEL PI INDICATOR 6.5 (GLOVE)
GLOVE BIOGEL PI INDICATOR 8 (GLOVE) ×2
GLOVE SURG SS PI 6.5 STRL IVOR (GLOVE) IMPLANT
GOWN STRL REUS W/TWL LRG LVL3 (GOWN DISPOSABLE) ×3 IMPLANT
GOWN STRL REUS W/TWL XL LVL3 (GOWN DISPOSABLE) IMPLANT
HANDPIECE INTERPULSE COAX TIP (DISPOSABLE) ×2
IMMOBILIZER KNEE 20 (SOFTGOODS) ×6 IMPLANT
IMMOBILIZER KNEE 20 THIGH 36 (SOFTGOODS) ×1 IMPLANT
MANIFOLD NEPTUNE II (INSTRUMENTS) ×3 IMPLANT
NS IRRIG 1000ML POUR BTL (IV SOLUTION) ×3 IMPLANT
PACK TOTAL KNEE CUSTOM (KITS) ×3 IMPLANT
PAD ABD 8X10 STRL (GAUZE/BANDAGES/DRESSINGS) ×3 IMPLANT
PADDING CAST COTTON 6X4 STRL (CAST SUPPLIES) ×3 IMPLANT
POSITIONER SURGICAL ARM (MISCELLANEOUS) ×3 IMPLANT
SET HNDPC FAN SPRY TIP SCT (DISPOSABLE) ×1 IMPLANT
STRIP CLOSURE SKIN 1/2X4 (GAUZE/BANDAGES/DRESSINGS) ×2 IMPLANT
SUT MNCRL AB 4-0 PS2 18 (SUTURE) ×3 IMPLANT
SUT VIC AB 2-0 CT1 27 (SUTURE) ×6
SUT VIC AB 2-0 CT1 TAPERPNT 27 (SUTURE) ×3 IMPLANT
SUT VLOC 180 0 24IN GS25 (SUTURE) ×3 IMPLANT
SYR 50ML LL SCALE MARK (SYRINGE) ×3 IMPLANT
TRAY FOLEY W/METER SILVER 16FR (SET/KITS/TRAYS/PACK) ×3 IMPLANT
WATER STERILE IRR 1000ML POUR (IV SOLUTION) ×6 IMPLANT
WATER STERILE IRR 1500ML POUR (IV SOLUTION) IMPLANT
WRAP KNEE MAXI GEL POST OP (GAUZE/BANDAGES/DRESSINGS) ×3 IMPLANT
YANKAUER SUCT BULB TIP 10FT TU (MISCELLANEOUS) ×3 IMPLANT

## 2016-04-24 NOTE — Anesthesia Procedure Notes (Signed)
Spinal  Patient location during procedure: OR Staffing Anesthesiologist: Catalina Gravel Performed: anesthesiologist  Preanesthetic Checklist Completed: patient identified, surgical consent, pre-op evaluation, timeout performed, IV checked, risks and benefits discussed and monitors and equipment checked Spinal Block Patient position: sitting Prep: site prepped and draped and DuraPrep Patient monitoring: continuous pulse ox and blood pressure Approach: midline Location: L3-4 Injection technique: single-shot Needle Needle type: Pencan  Needle gauge: 25 G Needle length: 9 cm Additional Notes Functioning IV was confirmed and monitors were applied. Sterile prep and drape, including hand hygiene, mask and sterile gloves were used. The patient was positioned and the spine was prepped. The skin was anesthetized with lidocaine.  Free flow of clear CSF was obtained prior to injecting local anesthetic into the CSF.  The spinal needle aspirated freely following injection.  The needle was carefully withdrawn.  The patient tolerated the procedure well. Consent was obtained prior to procedure with all questions answered and concerns addressed. Risks including but not limited to bleeding, infection, nerve damage, paralysis, failed block, inadequate analgesia, allergic reaction, high spinal, itching and headache were discussed and the patient wished to proceed.   Hoy Morn, MD

## 2016-04-24 NOTE — Transfer of Care (Signed)
Immediate Anesthesia Transfer of Care Note  Patient: David Chang  Procedure(s) Performed: Procedure(s): LEFT TOTAL KNEE ARTHROPLASTY (Left)  Patient Location: PACU  Anesthesia Type:MAC and Spinal  Level of Consciousness: awake, alert  and oriented  Airway & Oxygen Therapy: Patient Spontanous Breathing and Patient connected to face mask oxygen  Post-op Assessment: Report given to RN and Post -op Vital signs reviewed and stable  Post vital signs: Reviewed and stable  Last Vitals:  Vitals:   04/24/16 0746 04/24/16 0801  BP: (!) 171/105 (!) 141/96  Pulse:    Resp:    Temp:      Last Pain:  Vitals:   04/24/16 0745  TempSrc: Oral      Patients Stated Pain Goal: 4 (27/73/75 0510)  Complications: No apparent anesthesia complications

## 2016-04-24 NOTE — Op Note (Signed)
OPERATIVE REPORT-TOTAL KNEE ARTHROPLASTY   Pre-operative diagnosis- Osteoarthritis  Left knee(s)  Post-operative diagnosis- Osteoarthritis Left knee(s)  Procedure-  Left  Total Knee Arthroplasty (Depuy Attune)  Surgeon- Dione Plover. Erin Uecker, MD  Assistant- Arlee Muslim, PA-C   Anesthesia-  Spinal  EBL-* No blood loss amount entered *   Drains Hemovac  Tourniquet time-  Total Tourniquet Time Documented: Thigh (Left) - 37 minutes Total: Thigh (Left) - 37 minutes     Complications- None  Condition-PACU - hemodynamically stable.   Brief Clinical Note   David Chang is a 55 y.o. year old male with end stage OA of his left knee with progressively worsening pain and dysfunction. He has constant pain, with activity and at rest and significant functional deficits with difficulties even with ADLs. He has had extensive non-op management including analgesics, injections of cortisone, and home exercise program, but remains in significant pain with significant dysfunction. Radiographs show bone on bone arthritis all 3 compartments with tibial subluxation and significant deformity. He presents now for left Total Knee Arthroplasty.     Procedure in detail---   The patient is brought into the operating room and positioned supine on the operating table. After successful administration of  Spinal,   a tourniquet is placed high on the  Left thigh(s) and the lower extremity is prepped and draped in the usual sterile fashion. Time out is performed by the operating team and then the  Left lower extremity is wrapped in Esmarch, knee flexed and the tourniquet inflated to 300 mmHg.       A midline incision is made with a ten blade through the subcutaneous tissue to the level of the extensor mechanism. A fresh blade is used to make a medial parapatellar arthrotomy. Soft tissue over the proximal medial tibia is subperiosteally elevated to the joint line with a knife and into the semimembranosus bursa with  a Cobb elevator. Soft tissue over the proximal lateral tibia is elevated with attention being paid to avoiding the patellar tendon on the tibial tubercle. The patella is everted, knee flexed 90 degrees and the ACL and PCL are removed. Findings are bone on bone all 3 compartments with massive global osteophytes.        The drill is used to create a starting hole in the distal femur and the canal is thoroughly irrigated with sterile saline to remove the fatty contents. The 5 degree Left  valgus alignment guide is placed into the femoral canal and the distal femoral cutting block is pinned to remove 9 mm off the distal femur. Resection is made with an oscillating saw.      The tibia is subluxed forward and the menisci are removed. The extramedullary alignment guide is placed referencing proximally at the medial aspect of the tibial tubercle and distally along the second metatarsal axis and tibial crest. The block is pinned to remove 57mm off the more deficient lateral  side. Resection is made with an oscillating saw. Size 8is the most appropriate size for the tibia and the proximal tibia is prepared with the modular drill and keel punch for that size.      The femoral sizing guide is placed and size 7 is most appropriate. Rotation is marked off the epicondylar axis and confirmed by creating a rectangular flexion gap at 90 degrees. The size 7 cutting block is pinned in this rotation and the anterior, posterior and chamfer cuts are made with the oscillating saw. The intercondylar block is then placed and  that cut is made.      Trial size 8 tibial component, trial size 7 posterior stabilized femur and a 10  mm posterior stabilized rotating platform insert trial is placed. Full extension is achieved with excellent varus/valgus and anterior/posterior balance throughout full range of motion. The patella is everted and thickness measured to be 27  mm. Free hand resection is taken to 15 mm, a 41 template is placed, lug  holes are drilled, trial patella is placed, and it tracks normally. Osteophytes are removed off the posterior femur with the trial in place. All trials are removed and the cut bone surfaces prepared with pulsatile lavage. Cement is mixed and once ready for implantation, the size 8 tibial implant, size  7 posterior stabilized femoral component, and the size 41 patella are cemented in place and the patella is held with the clamp. The trial insert is placed and the knee held in full extension. The Exparel (20 ml mixed with 30 ml saline) and .25% Bupivicaine, are injected into the extensor mechanism, posterior capsule, medial and lateral gutters and subcutaneous tissues.  All extruded cement is removed and once the cement is hard the permanent 10 mm posterior stabilized rotating platform insert is placed into the tibial tray.      The wound is copiously irrigated with saline solution and the extensor mechanism closed over a hemovac drain with #1 V-loc suture. The tourniquet is released for a total tourniquet time of 37  minutes. Flexion against gravity is 140 degrees and the patella tracks normally. Subcutaneous tissue is closed with 2.0 vicryl and subcuticular with running 4.0 Monocryl. The incision is cleaned and dried and steri-strips and a bulky sterile dressing are applied. The limb is placed into a knee immobilizer and the patient is awakened and transported to recovery in stable condition.      Please note that a surgical assistant was a medical necessity for this procedure in order to perform it in a safe and expeditious manner. Surgical assistant was necessary to retract the ligaments and vital neurovascular structures to prevent injury to them and also necessary for proper positioning of the limb to allow for anatomic placement of the prosthesis.   Dione Plover Imunique Samad, MD    04/24/2016, 11:16 AM

## 2016-04-24 NOTE — H&P (View-Only) (Signed)
David Chang DOB: 02-28-1961 Married / Language: English / Race: White Male Date of Admission:  04/24/2016 CC:  Left Knee Pain History of Present Illness The patient is a 55 year old male who comes in  for a preoperative History and Physical. The patient is scheduled for a left total knee arthroplasty to be performed by Dr. Dione Chang. Aluisio, MD at [facility] on 04-24-2016. The patient is a 55 year old male who presented with knee complaints. The patient was seen for a second opinion. The patient reports left knee (worse than right) symptoms including: pain, swelling, stiffness, soreness and grinding which began year(s) ago without any known injury (hx of ACL, MCL, and PCL injury in college). The patient describes the severity of the symptoms as severe.The patient feels that the symptoms are worsening (especially over the three months). The patient has the current diagnosis of knee osteoarthritis. Prior to being seen, the patient was previously evaluated by a colleague David Ishihara Hubler, DO). Previous work-up for this problem has included knee x-rays. Past treatment for this problem has included intra-articular injection of corticosteroids (also had Synvisc One about three months ago). Note for "Knee pain": He states that he had a heart attack in November of last year. He had three stents put in in at that time, and could not get clearance for surgery until now [name] comes in concerning the left knee. He had heard about Dr. Wynelle Chang and wanted to be evaluated for a possible knee replacement. The patient is a 55 year old male who injured his left knee back in college when he was playing basketball at [employer]. He had an ACL tear, MCL and PCL injury. He had his ACL repaired and unfortunately six years later, he tore his ACL again, so he has had ACL deficient knee for quite some time now. Both knees are hurting but the left knee is definitely most symptomatic and problematic of the two. He has  significant pain and swelling with it, has a lot of stiffness with any kind of sedentary sitting, soreness and grinding. It has gotten worse for the past three months and he has been wanting to get it evaluated. He has had x-rays done. He has had cortisone injections in the past, which only helped temporarily. He did the Synvisc-One gel injection, which only lasted about three months. He has been followed by Dr. Adin Chang in [place]. This is where he got the cortisone and gel injections. Again, for the past three to four months, it has gotten much worse. He is limping more and more now and has to wear pads in his shoe because of the malalignment of his knee. It is constantly swelling and cannot bend it well. He does have a significant history of cardiac disease. He had a heart attack back in November 2015 and had three stents due to a coronary arterial disease. He had to wait at least a year following his stent placement. He is followed by Dr. Daneen Chang from a cardiology standpoint, referred him over for evaluation. He has been cleared from a cardiac standpoint, now wishes to proceed with evaluation and probable surgery. They have been treated conservatively in the past for the above stated problem and despite conservative measures, they continue to have progressive pain and severe functional limitations and dysfunction. They have failed non-operative management including home exercise, medications, and injections. It is felt that they would benefit from undergoing total joint replacement. Risks and benefits of the procedure have been discussed  with the patient and they elect to proceed with surgery. There are no active contraindications to surgery such as ongoing infection or rapidly progressive neurological disease.   Problem List/Past Medical Tricompartment osteoarthritis of left knee (M17.12)  High blood pressure  Myocardial infarction  Allergies No Known Drug Allergies   Family History Cancer   Father. Congestive Heart Failure  Brother, Maternal Grandmother, Paternal Grandfather. First Degree Relatives  reported Heart disease in male family member before age 58  Hypertension  Brother, Sister. Liver Disease, Chronic  Brother. Osteoarthritis  Mother.  Social History Children  2 Current drinker  03/17/2016: Currently drinks beer only occasionally per week Current work status  working full time Exercise  Exercises daily; does gym / [employer] Living situation  live with spouse Marital status  married No history of drug/alcohol rehab  Not under pain contract  Number of flights of stairs before winded  greater than 5 Tobacco / smoke exposure  03/17/2016: no Tobacco use  Never smoker. 03/17/2016  Medication History Losartan Potassium (100MG  Tablet, Oral) Active. Pennsaid (2% Solution, Transdermal) Active. Clopidogrel Bisulfate (75MG  Tablet, Oral) Active. AmLODIPine Besylate (10MG  Tablet, Oral) Active. Metoprolol Succinate ER (50MG  Tablet ER 24HR, Oral) Active. Acetaminophen (325MG  Tablet, Oral) Active. Aspirin (81MG  Tablet, Oral) Active. Nitroglycerin (0.4MG Dorita Fray Solution, Translingual) Active. Fish Oil (Oral) Specific strength unknown - Active.  Past Surgical History Heart Stents  3 Bilateral Tricep Tendon Surgery  Left Knee ACL Reconstruction  Date: 1982.    Review of Systems General Not Present- Chills, Fatigue, Fever, Memory Loss, Night Sweats, Weight Gain and Weight Loss. Skin Not Present- Eczema, Hives, Itching, Lesions and Rash. HEENT Not Present- Dentures, Double Vision, Headache, Hearing Loss, Tinnitus and Visual Loss. Respiratory Not Present- Allergies, Chronic Cough, Coughing up blood, Shortness of breath at rest and Shortness of breath with exertion. Cardiovascular Not Present- Chest Pain, Difficulty Breathing Lying Down, Murmur, Palpitations, Racing/skipping heartbeats and Swelling. Gastrointestinal Not Present- Abdominal Pain,  Bloody Stool, Constipation, Diarrhea, Difficulty Swallowing, Heartburn, Jaundice, Loss of appetitie, Nausea and Vomiting. Male Genitourinary Present- Urinating at Night. Not Present- Blood in Urine, Discharge, Flank Pain, Incontinence, Painful Urination, Urgency, Urinary frequency, Urinary Retention and Weak urinary stream. Musculoskeletal Present- Joint Pain. Not Present- Back Pain, Joint Swelling, Morning Stiffness, Muscle Pain, Muscle Weakness and Spasms. Neurological Not Present- Blackout spells, Difficulty with balance, Dizziness, Paralysis, Tremor and Weakness. Psychiatric Not Present- Insomnia.  Vitals  Weight: 228 lb Height: 72in Weight was reported by patient. Height was reported by patient. Body Surface Area: 2.25 m Body Mass Index: 30.92 kg/m  Pulse: 92 (Regular)  BP: 150/98 (Sitting, Right Arm, Standard)   Physical Exam General Mental Status -Alert, cooperative and good historian. General Appearance-pleasant, Not in acute distress. Orientation-Oriented X3. Build & Nutrition-Well nourished and Well developed.  Head and Neck Head-normocephalic, atraumatic . Neck Global Assessment - supple, no bruit auscultated on the right, no bruit auscultated on the left.  Eye Pupil - Bilateral-Regular and Round. Motion - Bilateral-EOMI.  Chest and Lung Exam Auscultation Breath sounds - clear at anterior chest wall and clear at posterior chest wall. Adventitious sounds - No Adventitious sounds.  Cardiovascular Auscultation Rhythm - Regular rate and rhythm. Heart Sounds - S1 WNL and S2 WNL. Murmurs & Other Heart Sounds - Auscultation of the heart reveals - No Murmurs.  Abdomen Palpation/Percussion Tenderness - Abdomen is non-tender to palpation. Rigidity (guarding) - Abdomen is soft. Auscultation Auscultation of the abdomen reveals - Bowel sounds normal.  Male Genitourinary Note: Not done, not pertinent to  present illness   Musculoskeletal Note:  The patient is a 55 year old white male, muscular build, tall frame. Left knee is examined. He has about a 15 degree valgus malalignment deformity on range of motion. He has actually full extension and flexion back to 135 passively. He can get about 130 actively. He has significant marked crepitus on passive range of motion. No positive effusion. Tender over the medial and lateral joint lines. He does have a little bit of instability and pseudolaxity with the knee coming back to alignment on varus stressing against the valgus malalignment.  RADIOGRAPHS X-rays taken today, AP and lateral view of the left knee which show severe arthritis in the lateral compartment with complete loss of the joint space. He has got a malalignment of valgus, malalignment with lateral translocation of the tibia laterally in relation to the femur. He has massive spurring on the femoral side. Lateral view shows complete bone-on-bone in the weightbearing surface with large posterior osteophytes. Significant degenerative changes in patellofemoral compartment, noted to be already bone-on-bone.   Assessment & Plan Tricompartment osteoarthritis of left knee (M17.12)  Note:Surgical Plans: Left Total Knee Replacement  Disposition: Home  PCP: Dr. Laqueta Due - Patient has been seen preoperatively and felt to be stable for surgery. Cards: Dr. Tamala Julian - Patient has been seen preoperatively and felt to be stable for surgery.  Topical TXA - MI  Anesthesia Issues: None  Signed electronically by Ok Edwards, III PA-C

## 2016-04-24 NOTE — Anesthesia Preprocedure Evaluation (Addendum)
Anesthesia Evaluation  Patient identified by MRN, date of birth, ID band Patient awake    Reviewed: Allergy & Precautions, NPO status , Patient's Chart, lab work & pertinent test results, reviewed documented beta blocker date and time   Airway Mallampati: II  TM Distance: >3 FB Neck ROM: Full    Dental  (+) Teeth Intact, Dental Advisory Given   Pulmonary neg pulmonary ROS,    Pulmonary exam normal breath sounds clear to auscultation       Cardiovascular hypertension, Pt. on medications and Pt. on home beta blockers + angina + CAD, + Past MI, + Cardiac Stents and +CHF  Normal cardiovascular exam Rhythm:Regular Rate:Normal  S/p angioplasty with stent to mLAD, pRCA, mLCx 03/2015  Echo 03/2015: Study Conclusions  - Procedure narrative: Transthoracic echocardiography. Image quality was poor. The study was technically difficult, as a result of poor acoustic windows, poor sound wave transmission, restricted patient mobility, and body habitus. - Left ventricle: The cavity size was normal. Wall thickness was increased in a pattern of moderate LVH. Systolic function was mildly reduced. The estimated ejection fraction was in the range of 45% to 50%. Regional wall motion abnormalities cannot be   excluded. Doppler parameters are consistent with abnormal left ventricular relaxation (grade 1 diastolic dysfunction). - Aortic valve: There was mild regurgitation. - Mitral valve: There was mild regurgitation. - Left atrium: The atrium was moderately dilated.   Neuro/Psych negative neurological ROS     GI/Hepatic negative GI ROS, Neg liver ROS,   Endo/Other  Obesity   Renal/GU Renal InsufficiencyRenal disease     Musculoskeletal  (+) Arthritis , Osteoarthritis,    Abdominal   Peds  Hematology  (+) Blood dyscrasia (Plavix), , Plt 182k   Anesthesia Other Findings Day of surgery medications reviewed with the patient.  Reproductive/Obstetrics                             Anesthesia Physical Anesthesia Plan  ASA: III  Anesthesia Plan: Spinal and MAC   Post-op Pain Management:    Induction: Intravenous  Airway Management Planned: Simple Face Mask, Nasal Cannula and Natural Airway  Additional Equipment:   Intra-op Plan:   Post-operative Plan:   Informed Consent: I have reviewed the patients History and Physical, chart, labs and discussed the procedure including the risks, benefits and alternatives for the proposed anesthesia with the patient or authorized representative who has indicated his/her understanding and acceptance.   Dental advisory given  Plan Discussed with: CRNA, Anesthesiologist and Surgeon  Anesthesia Plan Comments: (Discussed risks and benefits of and differences between spinal and general. Discussed risks of spinal including headache, backache, failure, bleeding, infection, and nerve damage. Patient consents to spinal. Questions answered. Coagulation studies and platelet count acceptable.)        Anesthesia Quick Evaluation

## 2016-04-24 NOTE — Interval H&P Note (Signed)
History and Physical Interval Note:  04/24/2016 9:19 AM  [name]  has presented today for surgery, with the diagnosis of LEFT KNEE OA  The various methods of treatment have been discussed with the patient and family. After consideration of risks, benefits and other options for treatment, the patient has consented to  Procedure(s): LEFT TOTAL KNEE ARTHROPLASTY (Left) as a surgical intervention .  The patient's history has been reviewed, patient examined, no change in status, stable for surgery.  I have reviewed the patient's chart and labs.  Questions were answered to the patient's satisfaction.     David Chang

## 2016-04-24 NOTE — Anesthesia Postprocedure Evaluation (Signed)
Anesthesia Post Note  Patient: David Chang  Procedure(s) Performed: Procedure(s) (LRB): LEFT TOTAL KNEE ARTHROPLASTY (Left)  Patient location during evaluation: PACU Anesthesia Type: Spinal Level of consciousness: oriented and awake and alert Pain management: pain level controlled Vital Signs Assessment: post-procedure vital signs reviewed and stable Respiratory status: spontaneous breathing, respiratory function stable and patient connected to nasal cannula oxygen Cardiovascular status: blood pressure returned to baseline and stable Postop Assessment: no headache, no backache, patient able to bend at knees, spinal receding and no signs of nausea or vomiting Anesthetic complications: no    Last Vitals:  Vitals:   04/24/16 1330 04/24/16 1345  BP: 124/86 125/73  Pulse: 62 64  Resp: 12   Temp: 36.8 C 36.6 C    Last Pain:  Vitals:   04/24/16 1330  TempSrc:   PainSc: 0-No pain                 Catalina Gravel

## 2016-04-25 LAB — CBC
HEMATOCRIT: 41.8 % (ref 39.0–52.0)
HEMOGLOBIN: 14.3 g/dL (ref 13.0–17.0)
MCH: 32.2 pg (ref 26.0–34.0)
MCHC: 34.2 g/dL (ref 30.0–36.0)
MCV: 94.1 fL (ref 78.0–100.0)
Platelets: 146 10*3/uL — ABNORMAL LOW (ref 150–400)
RBC: 4.44 MIL/uL (ref 4.22–5.81)
RDW: 13.1 % (ref 11.5–15.5)
WBC: 15.5 10*3/uL — AB (ref 4.0–10.5)

## 2016-04-25 LAB — BASIC METABOLIC PANEL
ANION GAP: 8 (ref 5–15)
BUN: 24 mg/dL — ABNORMAL HIGH (ref 6–20)
CHLORIDE: 103 mmol/L (ref 101–111)
CO2: 24 mmol/L (ref 22–32)
Calcium: 8.5 mg/dL — ABNORMAL LOW (ref 8.9–10.3)
Creatinine, Ser: 1.47 mg/dL — ABNORMAL HIGH (ref 0.61–1.24)
GFR calc non Af Amer: 51 mL/min — ABNORMAL LOW (ref >60)
GFR, EST AFRICAN AMERICAN: 59 mL/min — AB (ref >60)
GLUCOSE: 161 mg/dL — AB (ref 65–99)
POTASSIUM: 4.6 mmol/L (ref 3.5–5.1)
Sodium: 135 mmol/L (ref 135–145)

## 2016-04-25 MED ORDER — TRAMADOL HCL 50 MG PO TABS: 50.0000 mg | ORAL_TABLET | Freq: Four times a day (QID) | ORAL | 1 refills | Status: DC | PRN

## 2016-04-25 MED ORDER — OXYCODONE HCL 5 MG PO TABS: 5.0000 mg | ORAL_TABLET | ORAL | 0 refills | Status: DC | PRN

## 2016-04-25 MED ORDER — METHOCARBAMOL 500 MG PO TABS: 500.0000 mg | ORAL_TABLET | Freq: Four times a day (QID) | ORAL | 0 refills | Status: DC | PRN

## 2016-04-25 MED ORDER — ASPIRIN 325 MG PO TBEC: 325.0000 mg | DELAYED_RELEASE_TABLET | Freq: Every day | ORAL | 0 refills | Status: DC

## 2016-04-25 NOTE — Care Management Note (Addendum)
Case Management Note  Patient Details  Name: David Chang MRN: [id number] Date of Birth: 08/21/1960  Subjective/Objective:                  LEFT TOTAL KNEE ARTHROPLASTY (Left) Action/Plan: Discharge planning  Expected Discharge Date:  04/26/16               Expected Discharge Plan:  Home/Self Care  In-House Referral:     Discharge planning Services  CM Consult  Post Acute Care Choice:  NA Choice offered to:  Patient  DME Arranged:  [facility]ford Rile rolling DME Agency:  [place]:  NA [place] Agency:  NA  Status of Service:  Completed, signed off  If discussed at [place] of Stay Meetings, dates discussed:    Additional Comments: CM met with pt in room to offer choice of home health agency.  Pt states he is going to outpt PT and already has an appointment setup for Thursday 04/27/16; CM has requested Charge RN to confirm with MD this afternoon and please provide prescription to pt if needed.  Cm notified [facility] to please deliver the rolling walker to room.  No other CM needs were communicated. Dellie Catholic, RN 04/25/2016, 3:03 PM

## 2016-04-25 NOTE — Progress Notes (Signed)
Physical Therapy Treatment Patient Details Name: David Chang MRN: [id number] DOB: 1960/08/13 Today's Date: 04/25/2016    History of Present Illness 55 yo male s/p L TKA 04/24/16    PT Comments    Progressing very well with mobility. All education completed. Practiced stair negotiation. Okay to d/c from PT standpoint if pt discharges home later today.   Follow Up Recommendations  Home health PT     Equipment Recommendations  Rolling walker with 5" wheels    Recommendations for Other Services OT consult     Precautions / Restrictions Precautions Precautions: Fall;Knee Required Braces or Orthoses:  (able to SLR) Knee Immobilizer - Left: Discontinue once straight leg raise with < 10 degree lag Restrictions Weight Bearing Restrictions: No LLE Weight Bearing: Weight bearing as tolerated    Mobility  Bed Mobility Overal bed mobility: Modified Independent             General bed mobility comments: oob in recliner  Transfers Overall transfer level: Needs assistance Equipment used: Rolling walker (2 wheeled) Transfers: Sit to/from Stand Sit to Stand: Supervision         General transfer comment: for safety  Ambulation/Gait Ambulation/Gait assistance: Supervision Ambulation Distance (Feet): 125 Feet Assistive device: Rolling walker (2 wheeled) Gait Pattern/deviations: Step-through pattern;Decreased stride length     General Gait Details: for safety   Stairs Stairs: Yes Stairs assistance: Min assist Stair Management: Step to pattern Number of Stairs: 2 General stair comments: 1 HHA given. VCs safety, technique, sequence. Assist to stabilize.   Wheelchair Mobility    Modified Rankin (Stroke Patients Only)       Balance                                    Cognition Arousal/Alertness: Awake/alert Behavior During Therapy: WFL for tasks assessed/performed Overall Cognitive Status: Within Functional Limits for tasks assessed                       Exercises    General Comments        Pertinent Vitals/Pain Pain Assessment: 0-10 Pain Score: 6  Pain Location: L knee with activity Pain Descriptors / Indicators: Aching;Sore Pain Intervention(s): Monitored during session;Repositioned;Ice applied    Home Living Family/patient expects to be discharged to:: Private residence       Home Access: Stairs to enter Entrance Stairs-Rails: None          Prior Function            PT Goals (current goals can now be found in the care plan section) Acute Rehab PT Goals Patient Stated Goal: home today. regain PLOF PT Goal Formulation: With patient/family Time For Goal Achievement: 05/09/16 Potential to Achieve Goals: Good Progress towards PT goals: Progressing toward goals    Frequency    7X/week      PT Plan Current plan remains appropriate    Co-evaluation             End of Session Equipment Utilized During Treatment: Gait belt Activity Tolerance: Patient tolerated treatment well Patient left: in bed;with call bell/phone within reach;with family/visitor present     Time: 7893-8101 PT Time Calculation (min) (ACUTE ONLY): 13 min  Charges:  $Gait Training: 8-22 mins                    G Codes:      Weston Anna,  MPT Pager: [phone]

## 2016-04-25 NOTE — Evaluation (Signed)
Occupational Therapy Evaluation Patient Details Name: David Chang MRN: [id number] DOB: [birth year] Today's Date: 04/25/2016    History of Present Illness s/p L TKA   Clinical Impression   This 55 year old man was admitted for the above sx. All education was completed. No further OT is needed at this itme    Follow Up Recommendations  No OT follow up;Supervision/Assistance - 24 hour    Equipment Recommendations  None recommended by OT    Recommendations for Other Services       Precautions / Restrictions Precautions Precautions: Knee;Fall Required Braces or Orthoses: Knee Immobilizer - Left Restrictions Weight Bearing Restrictions: No LLE Weight Bearing: Weight bearing as tolerated      Mobility Bed Mobility               General bed mobility comments: supervision  Transfers Overall transfer level: Needs assistance Equipment used: Rolling walker (2 wheeled) Transfers: Sit to/from Stand Sit to Stand: Min guard         General transfer comment: for safety    Balance                                            ADL Overall ADL's : Needs assistance/impaired             Lower Body Bathing: Min guard;Sit to/from stand       Lower Body Dressing: Minimal assistance;Sit to/from stand   Toilet Transfer: Min guard;Ambulation;BSC;RW   Toileting- Water quality scientist and Hygiene: Min guard;Sit to/from stand   Tub/ Shower Transfer: Walk-in shower;Min guard;Ambulation;3 in 1     General ADL Comments: daughter will assist pt as needed.  He tends to move quickly but had no LOB.  All education completed including side stepping through tight spaces, safety, and knee precautions     Vision     Perception     Praxis      Pertinent Vitals/Pain Pain Assessment: 0-10 Pain Score: 2  Pain Location: L knee Pain Descriptors / Indicators: Sore Pain Intervention(s): Limited activity within patient's tolerance;Monitored during  session;Premedicated before session;Repositioned;Ice applied     Hand Dominance     Extremity/Trunk Assessment Upper Extremity Assessment Upper Extremity Assessment: Overall WFL for tasks assessed           Communication Communication Communication: No difficulties   Cognition Arousal/Alertness: Awake/alert Behavior During Therapy: WFL for tasks assessed/performed Overall Cognitive Status: Within Functional Limits for tasks assessed                     General Comments       Exercises       Shoulder Instructions      Home Living Family/patient expects to be discharged to:: Private residence Living Arrangements: Spouse/significant other Available Help at Discharge: Family               Bathroom Shower/Tub: Occupational psychologist: Standard     Home Equipment: Bedside commode          Prior Functioning/Environment Level of Independence: Independent                 OT Problem List:     OT Treatment/Interventions:      OT Goals(Current goals can be found in the care plan section) Acute Rehab OT Goals Patient Stated Goal: home OT Goal Formulation: All  assessment and education complete, DC therapy  OT Frequency:     Barriers to D/C:            Co-evaluation              End of Session CPM Left Knee CPM Left Knee: Off  Activity Tolerance: Patient tolerated treatment well Patient left: in chair;with call bell/phone within reach;with chair alarm set;with family/visitor present   Time: 9012-2241 OT Time Calculation (min): 15 min Charges:  OT General Charges $OT Visit: 1 Procedure OT Evaluation $OT Eval Low Complexity: 1 Procedure G-Codes:    Zeynab Klett 05-17-2016, 11:01 AM Lesle Chris, OTR/L [phone] 05/17/2016

## 2016-04-25 NOTE — Discharge Summary (Signed)
Physician Discharge Summary   Patient ID: David Chang MRN: [id number] DOB/AGE: 11/05/1960 55 y.o.  Admit date: 04/24/2016 Discharge date: 04-25-2016  Primary Diagnosis:  Osteoarthritis  Left knee(s) Admission Diagnoses:  Past Medical History:  Diagnosis Date  . Arthritis   . Coronary artery disease   . Hyperlipidemia LDL goal <70 04/10/2015  . Hypertension   . Ischemic cardiomyopathy 04/10/2015  . S/P angioplasty with stent to mLAD DES, pRCA DES, mLCX DES 04/08/15  04/10/2015   Discharge Diagnoses:   Principal Problem:   OA (osteoarthritis) of knee  Estimated body mass index is 31.6 kg/m as calculated from the following:   Height as of this encounter: 6' (1.829 m).   Weight as of this encounter: 105.7 kg (233 lb).  Procedure:  Procedure(s) (LRB): LEFT TOTAL KNEE ARTHROPLASTY (Left)   Consults: None  HPI: David Chang is a 55 y.o. year old male with end stage OA of his left knee with progressively worsening pain and dysfunction. He has constant pain, with activity and at rest and significant functional deficits with difficulties even with ADLs. He has had extensive non-op management including analgesics, injections of cortisone, and home exercise program, but remains in significant pain with significant dysfunction. Radiographs show bone on bone arthritis all 3 compartments with tibial subluxation and significant deformity. He presents now for left Total Knee Arthroplasty.  Laboratory Data: Admission on 04/24/2016  Component Date Value Ref Range Status  . WBC 04/25/2016 15.5* 4.0 - 10.5 K/uL Final  . RBC 04/25/2016 4.44  4.22 - 5.81 MIL/uL Final  . Hemoglobin 04/25/2016 14.3  13.0 - 17.0 g/dL Final  . HCT 04/25/2016 41.8  39.0 - 52.0 % Final  . MCV 04/25/2016 94.1  78.0 - 100.0 fL Final  . MCH 04/25/2016 32.2  26.0 - 34.0 pg Final  . MCHC 04/25/2016 34.2  30.0 - 36.0 g/dL Final  . RDW 04/25/2016 13.1  11.5 - 15.5 % Final  . Platelets 04/25/2016 146* 150 - 400 K/uL  Final  . Sodium 04/25/2016 135  135 - 145 mmol/L Final  . Potassium 04/25/2016 4.6  3.5 - 5.1 mmol/L Final  . Chloride 04/25/2016 103  101 - 111 mmol/L Final  . CO2 04/25/2016 24  22 - 32 mmol/L Final  . Glucose, Bld 04/25/2016 161* 65 - 99 mg/dL Final  . BUN 04/25/2016 24* 6 - 20 mg/dL Final  . Creatinine, Ser 04/25/2016 1.47* 0.61 - 1.24 mg/dL Final  . Calcium 04/25/2016 8.5* 8.9 - 10.3 mg/dL Final  . GFR calc non Af Amer 04/25/2016 51* >60 mL/min Final  . GFR calc Af Amer 04/25/2016 59* >60 mL/min Final   Comment: (NOTE) The eGFR has been calculated using the CKD EPI equation. This calculation has not been validated in all clinical situations. eGFR's persistently <60 mL/min signify possible Chronic Kidney Disease.   Georgiann Hahn gap 04/25/2016 8  5 - 15 Final  Hospital Outpatient Visit on 04/17/2016  Component Date Value Ref Range Status  . aPTT 04/17/2016 27  24 - 36 seconds Final  . WBC 04/17/2016 9.2  4.0 - 10.5 K/uL Final  . RBC 04/17/2016 5.48  4.22 - 5.81 MIL/uL Final  . Hemoglobin 04/17/2016 17.9* 13.0 - 17.0 g/dL Final  . HCT 04/17/2016 52.2* 39.0 - 52.0 % Final  . MCV 04/17/2016 95.3  78.0 - 100.0 fL Final  . MCH 04/17/2016 32.7  26.0 - 34.0 pg Final  . MCHC 04/17/2016 34.3  30.0 - 36.0 g/dL Final  .  RDW 04/17/2016 13.5  11.5 - 15.5 % Final  . Platelets 04/17/2016 182  150 - 400 K/uL Final  . Sodium 04/17/2016 140  135 - 145 mmol/L Final  . Potassium 04/17/2016 4.7  3.5 - 5.1 mmol/L Final  . Chloride 04/17/2016 104  101 - 111 mmol/L Final  . CO2 04/17/2016 26  22 - 32 mmol/L Final  . Glucose, Bld 04/17/2016 92  65 - 99 mg/dL Final  . BUN 04/17/2016 31* 6 - 20 mg/dL Final  . Creatinine, Ser 04/17/2016 1.67* 0.61 - 1.24 mg/dL Final  . Calcium 04/17/2016 10.1  8.9 - 10.3 mg/dL Final  . Total Protein 04/17/2016 7.9  6.5 - 8.1 g/dL Final  . Albumin 04/17/2016 4.5  3.5 - 5.0 g/dL Final  . AST 04/17/2016 43* 15 - 41 U/L Final  . ALT 04/17/2016 40  17 - 63 U/L Final  .  Alkaline Phosphatase 04/17/2016 39  38 - 126 U/L Final  . Total Bilirubin 04/17/2016 0.7  0.3 - 1.2 mg/dL Final  . GFR calc non Af Amer 04/17/2016 44* >60 mL/min Final  . GFR calc Af Amer 04/17/2016 51* >60 mL/min Final   Comment: (NOTE) The eGFR has been calculated using the CKD EPI equation. This calculation has not been validated in all clinical situations. eGFR's persistently <60 mL/min signify possible Chronic Kidney Disease.   . Anion gap 04/17/2016 10  5 - 15 Final  . Prothrombin Time 04/17/2016 11.7  11.4 - 15.2 seconds Final  . INR 04/17/2016 0.86   Final  . ABO/RH(D) 04/24/2016 A POS   Final  . Antibody Screen 04/24/2016 NEG   Final  . Sample Expiration 04/24/2016 04/27/2016   Final  . Extend sample reason 04/24/2016 NO TRANSFUSIONS OR PREGNANCY IN THE PAST 3 MONTHS   Final  . Color, Urine 04/17/2016 YELLOW  YELLOW Final  . APPearance 04/17/2016 CLEAR  CLEAR Final  . Specific Gravity, Urine 04/17/2016 1.025  1.005 - 1.030 Final  . pH 04/17/2016 6.0  5.0 - 8.0 Final  . Glucose, UA 04/17/2016 100* NEGATIVE mg/dL Final  . Hgb urine dipstick 04/17/2016 NEGATIVE  NEGATIVE Final  . Bilirubin Urine 04/17/2016 NEGATIVE  NEGATIVE Final  . Ketones, ur 04/17/2016 NEGATIVE  NEGATIVE mg/dL Final  . Protein, ur 04/17/2016 100* NEGATIVE mg/dL Final  . Nitrite 04/17/2016 NEGATIVE  NEGATIVE Final  . Leukocytes, UA 04/17/2016 NEGATIVE  NEGATIVE Final  . MRSA, PCR 04/17/2016 NEGATIVE  NEGATIVE Final  . Staphylococcus aureus 04/17/2016 POSITIVE* NEGATIVE Final   Comment:        The Xpert SA Assay (FDA approved for NASAL specimens in patients over 60 years of age), is one component of a comprehensive surveillance program.  Test performance has been validated by [facility] for patients greater than or equal to 70 year old. It is not intended to diagnose infection nor to guide or monitor treatment.   . Squamous Epithelial / LPF 04/17/2016 0-5* NONE SEEN Final  . WBC, UA 04/17/2016  NONE SEEN  0 - 5 WBC/hpf Final  . RBC / HPF 04/17/2016 NONE SEEN  0 - 5 RBC/hpf Final  . Bacteria, UA 04/17/2016 NONE SEEN  NONE SEEN Final  . ABO/RH(D) 04/17/2016 A POS   Final     X-Rays:No results found.  EKG: Orders placed or performed in visit on 02/17/16  . EKG 12-Lead     Hospital Course: David Chang is a 55 y.o. who was admitted to [facility]. They were brought  to the operating room on 04/24/2016 and underwent Procedure(s): LEFT TOTAL KNEE ARTHROPLASTY.  Patient tolerated the procedure well and was later transferred to the recovery room and then to the orthopaedic floor for postoperative care.  They were given PO and IV analgesics for pain control following their surgery.  They were given 24 hours of postoperative antibiotics of  Anti-infectives    Start     Dose/Rate Route Frequency Ordered Stop   04/24/16 1800  ceFAZolin (ANCEF) IVPB 2g/100 mL premix     2 g 200 mL/hr over 30 Minutes Intravenous Every 6 hours 04/24/16 1405 04/25/16 0046   04/24/16 0743  ceFAZolin (ANCEF) IVPB 2g/100 mL premix     2 g 200 mL/hr over 30 Minutes Intravenous On call to O.R. 04/24/16 0743 04/24/16 1010   04/24/16 0743  ceFAZolin (ANCEF) IVPB 2g/100 mL premix  Status:  Discontinued     2 g 200 mL/hr over 30 Minutes Intravenous On call to O.R. 04/24/16 6381 04/24/16 0744     and started on DVT prophylaxis in the form of Aspirin and Plavix.   PT and OT were ordered for total joint protocol.  Discharge planning consulted to help with postop disposition and equipment needs.  Patient had a good night on the evening of surgery.  They started to get up OOB with therapy on day one. Hemovac drain was pulled without difficulty.   Dressing was checked and  was clean and dry. Patient was seen in rounds later that same evening on POD 1 by Dr. Wynelle Link and was ready to go home.   Diet: Cardiac diet Activity:WBAT Follow-up:in 2 weeks Disposition - Home Discharged Condition: good   Discharge  Instructions    Call MD / Call 911    Complete by:  As directed    If you experience chest pain or shortness of breath, CALL 911 and be transported to the hospital emergency room.  If you develope a fever above 101 F, pus (white drainage) or increased drainage or redness at the wound, or calf pain, call your surgeon's office.   Change dressing    Complete by:  As directed    Change dressing daily with sterile 4 x 4 inch gauze dressing and apply TED hose. Do not submerge the incision under water.   Constipation Prevention    Complete by:  As directed    Drink plenty of fluids.  Prune juice may be helpful.  You may use a stool softener, such as Colace (over the counter) 100 mg twice a day.  Use MiraLax (over the counter) for constipation as needed.   Diet - low sodium heart healthy    Complete by:  As directed    Discharge instructions    Complete by:  As directed    Pick up stool softner and laxative for home use following surgery while on pain medications. Do not submerge incision under water. Please use good hand washing techniques while changing dressing each day. May shower starting three days after surgery. Please use a clean towel to pat the incision dry following showers. Continue to use ice for pain and swelling after surgery. Do not use any lotions or creams on the incision until instructed by your surgeon.   Postoperative Constipation Protocol  Constipation - defined medically as fewer than three stools per week and severe constipation as less than one stool per week.  One of the most common issues patients have following surgery is constipation.  Even if you have a  regular bowel pattern at home, your normal regimen is likely to be disrupted due to multiple reasons following surgery.  Combination of anesthesia, postoperative narcotics, change in appetite and fluid intake all can affect your bowels.  In order to avoid complications following surgery, here are some recommendations  in order to help you during your recovery period.  Colace (docusate) - Pick up an over-the-counter form of Colace or another stool softener and take twice a day as long as you are requiring postoperative pain medications.  Take with a full glass of water daily.  If you experience loose stools or diarrhea, hold the colace until you stool forms back up.  If your symptoms do not get better within 1 week or if they get worse, check with your doctor.  Dulcolax (bisacodyl) - Pick up over-the-counter and take as directed by the product packaging as needed to assist with the movement of your bowels.  Take with a full glass of water.  Use this product as needed if not relieved by Colace only.   MiraLax (polyethylene glycol) - Pick up over-the-counter to have on hand.  MiraLax is a solution that will increase the amount of water in your bowels to assist with bowel movements.  Take as directed and can mix with a glass of water, juice, soda, coffee, or tea.  Take if you go more than two days without a movement. Do not use MiraLax more than once per day. Call your doctor if you are still constipated or irregular after using this medication for 7 days in a row.  If you continue to have problems with postoperative constipation, please contact the office for further assistance and recommendations.  If you experience "the worst abdominal pain ever" or develop nausea or vomiting, please contact the office immediatly for further recommendations for treatment.   Resume Plavix 75 mg daily at home. Take a full dose 325 mg Aspirin daily for three weeks then reduce back to a baby 81 mg Aspirin daily.   Do not put a pillow under the knee. Place it under the heel.    Complete by:  As directed    Do not sit on low chairs, stoools or toilet seats, as it may be difficult to get up from low surfaces    Complete by:  As directed    Driving restrictions    Complete by:  As directed    No driving until released by the physician.    Increase activity slowly as tolerated    Complete by:  As directed    Lifting restrictions    Complete by:  As directed    No lifting until released by the physician.   Patient may shower    Complete by:  As directed    You may shower without a dressing once there is no drainage.  Do not wash over the wound.  If drainage remains, do not shower until drainage stops.   TED hose    Complete by:  As directed    Use stockings (TED hose) for 3 weeks on both leg(s).  You may remove them at night for sleeping.   Weight bearing as tolerated    Complete by:  As directed    Laterality:  left   Extremity:  Lower       Medication List    STOP taking these medications   aspirin 81 MG chewable tablet Replaced by:  aspirin 325 MG EC tablet   FISH OIL PO   milk  thistle 175 MG tablet     TAKE these medications   acetaminophen 325 MG tablet Commonly known as:  TYLENOL Take 2 tablets (650 mg total) by mouth every 4 (four) hours as needed for headache or mild pain.   amLODipine 10 MG tablet Commonly known as:  NORVASC TAKE ONE TABLET BY MOUTH ONCE DAILY   aspirin 325 MG EC tablet Take 1 tablet (325 mg total) by mouth daily with breakfast. Take daily for three weeks and then rduce back to a baby 81 mg Aspirin daily Start taking on:  04/26/2016 Replaces:  aspirin 81 MG chewable tablet   clopidogrel 75 MG tablet Commonly known as:  PLAVIX Take 1 tablet (75 mg total) by mouth daily.   losartan 100 MG tablet Commonly known as:  COZAAR Take 100 mg by mouth daily.   methocarbamol 500 MG tablet Commonly known as:  ROBAXIN Take 1 tablet (500 mg total) by mouth every 6 (six) hours as needed for muscle spasms.   metoprolol succinate 100 MG 24 hr tablet Commonly known as:  TOPROL-XL Take 100 mg by mouth daily. Take with or immediately following a meal.   nitroGLYCERIN 0.4 MG SL tablet Commonly known as:  NITROSTAT Place 1 tablet (0.4 mg total) under the tongue every 5 (five) minutes as  needed for chest pain.   oxyCODONE 5 MG immediate release tablet Commonly known as:  Oxy IR/ROXICODONE Take 1-2 tablets (5-10 mg total) by mouth every 3 (three) hours as needed for moderate pain or severe pain.   traMADol 50 MG tablet Commonly known as:  ULTRAM Take 1-2 tablets (50-100 mg total) by mouth every 6 (six) hours as needed (mild pain).            Durable Medical Equipment        Start     Ordered   04/25/16 1011  For home use only DME Walker rolling  Once    Question:  Patient needs a walker to treat with the following condition  Answer:  OA (osteoarthritis) of knee   04/25/16 [place] Follow up.   Why:  rolling walker Contact information: [place] [address] [phone]        Gearlean Alf, MD. Schedule an appointment as soon as possible for a visit on 05/09/2016.   Specialty:  Orthopedic Surgery Contact information: [address] [place] [address] [phone]           Signed: Arlee Muslim, PA-C Orthopaedic Surgery 04/25/2016, 6:55 PM

## 2016-04-25 NOTE — Progress Notes (Signed)
   Subjective: 1 Day Post-Op Procedure(s) (LRB): LEFT TOTAL KNEE ARTHROPLASTY (Left) Patient reports pain as mild.   Patient seen in rounds for Dr. Wynelle Link.  Wife in room at bedside.  Briefly discussed the surgical findings. Patient is well, but has had some minor complaints of pain in the knee, requiring pain medications We will start therapy today.  Plan is to go Home after hospital stay.  Objective: Vital signs in last 24 hours: Temp:  [97.5 F (36.4 C)-98.3 F (36.8 C)] 97.9 F (36.6 C) (12/05 0527) Pulse Rate:  [62-79] 69 (12/05 0751) Resp:  [10-16] 15 (12/05 0527) BP: (100-144)/(68-101) 137/101 (12/05 0751) SpO2:  [96 %-100 %] 99 % (12/05 0527)  Intake/Output from previous day:  Intake/Output Summary (Last 24 hours) at 04/25/16 0846 Last data filed at 04/25/16 0600  Gross per 24 hour  Intake          2983.33 ml  Output             2325 ml  Net           658.33 ml    Intake/Output this shift: No intake/output data recorded.  Labs:  Recent Labs  04/25/16 0527  HGB 14.3    Recent Labs  04/25/16 0527  WBC 15.5*  RBC 4.44  HCT 41.8  PLT 146*    Recent Labs  04/25/16 0527  NA 135  K 4.6  CL 103  CO2 24  BUN 24*  CREATININE 1.47*  GLUCOSE 161*  CALCIUM 8.5*   No results for input(s): LABPT, INR in the last 72 hours.  EXAM General - Patient is Alert, Appropriate and Oriented Extremity - Neurovascular intact Sensation intact distally Intact pulses distally Dorsiflexion/Plantar flexion intact Dressing - dressing C/D/I Motor Function - intact, moving foot and toes well on exam.   Past Medical History:  Diagnosis Date  . Arthritis   . Coronary artery disease   . Hyperlipidemia LDL goal <70 04/10/2015  . Hypertension   . Ischemic cardiomyopathy 04/10/2015  . S/P angioplasty with stent to mLAD DES, pRCA DES, mLCX DES 04/08/15  04/10/2015    Assessment/Plan: 1 Day Post-Op Procedure(s) (LRB): LEFT TOTAL KNEE ARTHROPLASTY (Left) Principal  Problem:   OA (osteoarthritis) of knee  Estimated body mass index is 31.6 kg/m as calculated from the following:   Height as of this encounter: 6' (1.829 m).   Weight as of this encounter: 105.7 kg (233 lb). Advance diet Up with therapy Plan for discharge tomorrow Discharge home with home health  DVT Prophylaxis - Aspirin and Plavix Weight-Bearing as tolerated to left leg D/C O2 and Pulse OX and try on Room Air BUN Creat 24/1.47 but improved since before surgery.  Arlee Muslim, PA-C Orthopaedic Surgery 04/25/2016, 8:46 AM

## 2016-04-25 NOTE — Discharge Instructions (Addendum)
° °Dr. Frank Aluisio °Total Joint Specialist °[facility] °[address] °[place], [place] [address] °[phone] ° °TOTAL KNEE REPLACEMENT POSTOPERATIVE DIRECTIONS ° °Knee Rehabilitation, Guidelines Following Surgery  °Results after knee surgery are often greatly improved when you follow the exercise, range of motion and muscle strengthening exercises prescribed by your doctor. Safety measures are also important to protect the knee from further injury. Any time any of these exercises cause you to have increased pain or swelling in your knee joint, decrease the amount until you are comfortable again and slowly increase them. If you have problems or questions, call your caregiver or physical therapist for advice.  ° °HOME CARE INSTRUCTIONS  °Remove items at home which could result in a fall. This includes throw rugs or furniture in walking pathways.  °· ICE to the affected knee every three hours for 30 minutes at a time and then as needed for pain and swelling.  Continue to use ice on the knee for pain and swelling from surgery. You may notice swelling that will progress down to the foot and ankle.  This is normal after surgery.  Elevate the leg when you are not up walking on it.   °· Continue to use the breathing machine which will help keep your temperature down.  It is common for your temperature to cycle up and down following surgery, especially at night when you are not up moving around and exerting yourself.  The breathing machine keeps your lungs expanded and your temperature down. °· Do not place pillow under knee, focus on keeping the knee straight while resting ° °DIET °You may resume your previous home diet once your are discharged from the hospital. ° °DRESSING / WOUND CARE / SHOWERING °You may shower 3 days after surgery, but keep the wounds dry during showering.  You may use an occlusive plastic wrap (Press'n Seal for example), NO SOAKING/SUBMERGING IN THE BATHTUB.  If the  bandage gets wet, change with a clean dry gauze.  If the incision gets wet, pat the wound dry with a clean towel. °You may start showering once you are discharged home but do not submerge the incision under water. Just pat the incision dry and apply a dry gauze dressing on daily. °Change the surgical dressing daily and reapply a dry dressing each time. ° °ACTIVITY °Walk with your walker as instructed. °Use walker as long as suggested by your caregivers. °Avoid periods of inactivity such as sitting longer than an hour when not asleep. This helps prevent blood clots.  °You may resume a sexual relationship in one month or when given the OK by your doctor.  °You may return to work once you are cleared by your doctor.  °Do not drive a car for 6 weeks or until released by you surgeon.  °Do not drive while taking narcotics. ° °WEIGHT BEARING °Weight bearing as tolerated with assist device (walker, cane, etc) as directed, use it as long as suggested by your surgeon or therapist, typically at least 4-6 weeks. ° °POSTOPERATIVE CONSTIPATION PROTOCOL °Constipation - defined medically as fewer than three stools per week and severe constipation as less than one stool per week. ° °One of the most common issues patients have following surgery is constipation.  Even if you have a regular bowel pattern at home, your normal regimen is likely to be disrupted due to multiple reasons following surgery.  Combination of anesthesia, postoperative narcotics, change in appetite and fluid intake all can affect your bowels.    In order to avoid complications following surgery, here are some recommendations in order to help you during your recovery period. ° °Colace (docusate) - Pick up an over-the-counter form of Colace or another stool softener and take twice a day as long as you are requiring postoperative pain medications.  Take with a full glass of water daily.  If you experience loose stools or diarrhea, hold the colace until you stool forms  back up.  If your symptoms do not get better within 1 week or if they get worse, check with your doctor. ° °Dulcolax (bisacodyl) - Pick up over-the-counter and take as directed by the product packaging as needed to assist with the movement of your bowels.  Take with a full glass of water.  Use this product as needed if not relieved by Colace only.  ° °MiraLax (polyethylene glycol) - Pick up over-the-counter to have on hand.  MiraLax is a solution that will increase the amount of water in your bowels to assist with bowel movements.  Take as directed and can mix with a glass of water, juice, soda, coffee, or tea.  Take if you go more than two days without a movement. °Do not use MiraLax more than once per day. Call your doctor if you are still constipated or irregular after using this medication for 7 days in a row. ° °If you continue to have problems with postoperative constipation, please contact the office for further assistance and recommendations.  If you experience "the worst abdominal pain ever" or develop nausea or vomiting, please contact the office immediatly for further recommendations for treatment. ° °ITCHING ° If you experience itching with your medications, try taking only a single pain pill, or even half a pain pill at a time.  You can also use Benadryl over the counter for itching or also to help with sleep.  ° °TED HOSE STOCKINGS °Wear the elastic stockings on both legs for three weeks following surgery during the day but you may remove then at night for sleeping. ° °MEDICATIONS °See your medication summary on the “After Visit Summary” that the nursing staff will review with you prior to discharge.  You may have some home medications which will be placed on hold until you complete the course of blood thinner medication.  It is important for you to complete the blood thinner medication as prescribed by your surgeon.  Continue your approved medications as instructed at time of  discharge. ° °PRECAUTIONS °If you experience chest pain or shortness of breath - call 911 immediately for transfer to the hospital emergency department.  °If you develop a fever greater that 101 F, purulent drainage from wound, increased redness or drainage from wound, foul odor from the wound/dressing, or calf pain - CONTACT YOUR SURGEON.   °                                                °FOLLOW-UP APPOINTMENTS °Make sure you keep all of your appointments after your operation with your surgeon and caregivers. You should call the office at the above phone number and make an appointment for approximately two weeks after the date of your surgery or on the date instructed by your surgeon outlined in the "After Visit Summary". ° ° °RANGE OF MOTION AND STRENGTHENING EXERCISES  °Rehabilitation of the knee is important following a knee injury or   an operation. After just a few days of immobilization, the muscles of the thigh which control the knee become weakened and shrink (atrophy). Knee exercises are designed to build up the tone and strength of the thigh muscles and to improve knee motion. Often times heat used for twenty to thirty minutes before working out will loosen up your tissues and help with improving the range of motion but do not use heat for the first two weeks following surgery. These exercises can be done on a training (exercise) mat, on the floor, on a table or on a bed. Use what ever works the best and is most comfortable for you Knee exercises include:  Leg Lifts - While your knee is still immobilized in a splint or cast, you can do straight leg raises. Lift the leg to 60 degrees, hold for 3 sec, and slowly lower the leg. Repeat 10-20 times 2-3 times daily. Perform this exercise against resistance later as your knee gets better.  Quad and Hamstring Sets - Tighten up the muscle on the front of the thigh (Quad) and hold for 5-10 sec. Repeat this 10-20 times hourly. Hamstring sets are done by pushing the  foot backward against an object and holding for 5-10 sec. Repeat as with quad sets.   Leg Slides: Lying on your back, slowly slide your foot toward your buttocks, bending your knee up off the floor (only go as far as is comfortable). Then slowly slide your foot back down until your leg is flat on the floor again.  Angel Wings: Lying on your back spread your legs to the side as far apart as you can without causing discomfort.  A rehabilitation program following serious knee injuries can speed recovery and prevent re-injury in the future due to weakened muscles. Contact your doctor or a physical therapist for more information on knee rehabilitation.   IF YOU ARE TRANSFERRED TO A SKILLED REHAB FACILITY If the patient is transferred to a skilled rehab facility following release from the hospital, a list of the current medications will be sent to the facility for the patient to continue.  When discharged from the skilled rehab facility, please have the facility set up the patient's [place] prior to being released. Also, the skilled facility will be responsible for providing the patient with their medications at time of release from the facility to include their pain medication, the muscle relaxants, and their blood thinner medication. If the patient is still at the rehab facility at time of the two week follow up appointment, the skilled rehab facility will also need to assist the patient in arranging follow up appointment in our office and any transportation needs.  MAKE SURE YOU:  Understand these instructions.  Get help right away if you are not doing well or get worse.    Pick up stool softner and laxative for home use following surgery while on pain medications. Do not submerge incision under water. Please use good hand washing techniques while changing dressing each day. May shower starting three days after surgery. Please use a clean towel to pat the incision dry following  showers. Continue to use ice for pain and swelling after surgery. Do not use any lotions or creams on the incision until instructed by your surgeon.   Resume Plavix 75 mg daily at home. Take a full dose 325 mg Aspirin daily for three weeks then reduce back to a baby 81 mg Aspirin daily.

## 2016-04-25 NOTE — Evaluation (Addendum)
Physical Therapy Evaluation Patient Details Name: David Chang MRN: [id number] DOB: 1960-06-25 Today's Date: 04/25/2016   History of Present Illness  55 yo male s/p L TKA 04/24/16  Clinical Impression  On eval, pt was Min guard assist for mobility. He walked ~115 feet with a RW. Pain rated 6/10 with activity/exercises. Will plan to have a 2nd session today. Will possibly practice stair negotiation. Pt states he would like to d/c home today. Encouraged him to discuss this with MD.     Follow Up [facility] (per chart)    Equipment Recommendations  Rolling walker with 5" wheels    Recommendations for Other Services OT consult     Precautions / Restrictions Precautions Precautions: Fall;Knee Required Braces or Orthoses: Knee Immobilizer - Left (pt able to SLR) Knee Immobilizer - Left: Discontinue once straight leg raise with < 10 degree lag Restrictions Weight Bearing Restrictions: No LLE Weight Bearing: Weight bearing as tolerated      Mobility  Bed Mobility               General bed mobility comments: oob in recliner  Transfers Overall transfer level: Needs assistance Equipment used: Rolling walker (2 wheeled) Transfers: Sit to/from Stand Sit to Stand: Supervision         General transfer comment: VCs hand/LE placement.   Ambulation/Gait Ambulation/Gait assistance: Min guard Ambulation Distance (Feet): 115 Feet Assistive device: Rolling walker (2 wheeled) Gait Pattern/deviations: Step-to pattern;Step-through pattern;Decreased stride length     General Gait Details: close guard for safety.   Stairs            Wheelchair Mobility    Modified Rankin (Stroke Patients Only)       Balance                                             Pertinent Vitals/Pain Pain Assessment: 0-10 Pain Score: 6  Pain Location: L knee with activity Pain Descriptors / Indicators: Aching;Sore Pain Intervention(s): Monitored  during session;Ice applied    Home Living Family/patient expects to be discharged to:: Private residence Living Arrangements: Spouse/significant other Available Help at Discharge: Family   Home Access: Stairs to enter Entrance Stairs-Rails: None Entrance Stairs-Number of Steps: 2   Home Equipment: Bedside commode      Prior Function Level of Independence: Independent               Hand Dominance        Extremity/Trunk Assessment   Upper Extremity Assessment: Defer to OT evaluation           Lower Extremity Assessment: Generalized weakness      Cervical / Trunk Assessment: Normal  Communication   Communication: No difficulties  Cognition Arousal/Alertness: Awake/alert Behavior During Therapy: WFL for tasks assessed/performed Overall Cognitive Status: Within Functional Limits for tasks assessed                      General Comments      Exercises Total Joint Exercises Ankle Circles/Pumps: AROM;Both;15 reps;Supine Quad Sets: AROM;Both;15 reps;Supine Hip ABduction/ADduction: AROM;Left;10 reps;Supine Straight Leg Raises: AROM;Left;10 reps;Supine Knee Flexion: AAROM;Left;10 reps;Seated (with washcloth under foot ) Goniometric ROM: ~10-75 degrees   Assessment/Plan    PT Assessment Patient needs continued PT services  PT Problem List Decreased strength;Decreased mobility;Decreased range of motion;Decreased activity tolerance;Decreased balance;Decreased knowledge of use of DME;Pain  PT Treatment Interventions DME instruction;Therapeutic activities;Therapeutic exercise;Gait training;Patient/family education;Stair training;Balance training;Functional mobility training    PT Goals (Current goals can be found in the Care Plan section)  Acute Rehab PT Goals Patient Stated Goal: home today. regain PLOF PT Goal Formulation: With patient/family Time For Goal Achievement: 05/09/16 Potential to Achieve Goals: Good    Frequency 7X/week    Barriers to discharge        Co-evaluation               End of Session Equipment Utilized During Treatment: Gait belt;Left knee immobilizer Activity Tolerance: Patient tolerated treatment well Patient left: in chair;with call bell/phone within reach;with family/visitor present           Time: 5361-4431 PT Time Calculation (min) (ACUTE ONLY): 19 min   Charges:   PT Evaluation $PT Eval Low Complexity: 1 Procedure     PT G Codes:        Weston Anna, MPT Pager: [phone]

## 2016-04-27 DIAGNOSIS — M25562 Pain in left knee: Secondary | ICD-10-CM | POA: Diagnosis not present

## 2016-04-27 DIAGNOSIS — R2689 Other abnormalities of gait and mobility: Secondary | ICD-10-CM | POA: Diagnosis not present

## 2016-04-27 DIAGNOSIS — M62552 Muscle wasting and atrophy, not elsewhere classified, left thigh: Secondary | ICD-10-CM | POA: Diagnosis not present

## 2016-04-27 DIAGNOSIS — Z96652 Presence of left artificial knee joint: Secondary | ICD-10-CM | POA: Diagnosis not present

## 2016-05-01 DIAGNOSIS — Z96652 Presence of left artificial knee joint: Secondary | ICD-10-CM | POA: Diagnosis not present

## 2016-05-01 DIAGNOSIS — M62552 Muscle wasting and atrophy, not elsewhere classified, left thigh: Secondary | ICD-10-CM | POA: Diagnosis not present

## 2016-05-01 DIAGNOSIS — R2689 Other abnormalities of gait and mobility: Secondary | ICD-10-CM | POA: Diagnosis not present

## 2016-05-01 DIAGNOSIS — M25562 Pain in left knee: Secondary | ICD-10-CM | POA: Diagnosis not present

## 2016-05-03 DIAGNOSIS — M62552 Muscle wasting and atrophy, not elsewhere classified, left thigh: Secondary | ICD-10-CM | POA: Diagnosis not present

## 2016-05-03 DIAGNOSIS — R2689 Other abnormalities of gait and mobility: Secondary | ICD-10-CM | POA: Diagnosis not present

## 2016-05-03 DIAGNOSIS — M25562 Pain in left knee: Secondary | ICD-10-CM | POA: Diagnosis not present

## 2016-05-03 DIAGNOSIS — Z96652 Presence of left artificial knee joint: Secondary | ICD-10-CM | POA: Diagnosis not present

## 2016-05-05 DIAGNOSIS — M25562 Pain in left knee: Secondary | ICD-10-CM | POA: Diagnosis not present

## 2016-05-05 DIAGNOSIS — Z96652 Presence of left artificial knee joint: Secondary | ICD-10-CM | POA: Diagnosis not present

## 2016-05-05 DIAGNOSIS — R2689 Other abnormalities of gait and mobility: Secondary | ICD-10-CM | POA: Diagnosis not present

## 2016-05-05 DIAGNOSIS — M62552 Muscle wasting and atrophy, not elsewhere classified, left thigh: Secondary | ICD-10-CM | POA: Diagnosis not present

## 2016-05-08 DIAGNOSIS — R2689 Other abnormalities of gait and mobility: Secondary | ICD-10-CM | POA: Diagnosis not present

## 2016-05-08 DIAGNOSIS — Z96652 Presence of left artificial knee joint: Secondary | ICD-10-CM | POA: Diagnosis not present

## 2016-05-08 DIAGNOSIS — M62552 Muscle wasting and atrophy, not elsewhere classified, left thigh: Secondary | ICD-10-CM | POA: Diagnosis not present

## 2016-05-08 DIAGNOSIS — M25562 Pain in left knee: Secondary | ICD-10-CM | POA: Diagnosis not present

## 2016-05-10 DIAGNOSIS — M62552 Muscle wasting and atrophy, not elsewhere classified, left thigh: Secondary | ICD-10-CM | POA: Diagnosis not present

## 2016-05-10 DIAGNOSIS — M25562 Pain in left knee: Secondary | ICD-10-CM | POA: Diagnosis not present

## 2016-05-10 DIAGNOSIS — Z96652 Presence of left artificial knee joint: Secondary | ICD-10-CM | POA: Diagnosis not present

## 2016-05-10 DIAGNOSIS — R2689 Other abnormalities of gait and mobility: Secondary | ICD-10-CM | POA: Diagnosis not present

## 2016-05-11 ENCOUNTER — Other Ambulatory Visit: Payer: Self-pay | Admitting: Interventional Cardiology

## 2016-05-11 DIAGNOSIS — M25562 Pain in left knee: Secondary | ICD-10-CM | POA: Diagnosis not present

## 2016-05-11 DIAGNOSIS — M62552 Muscle wasting and atrophy, not elsewhere classified, left thigh: Secondary | ICD-10-CM | POA: Diagnosis not present

## 2016-05-11 DIAGNOSIS — R2689 Other abnormalities of gait and mobility: Secondary | ICD-10-CM | POA: Diagnosis not present

## 2016-05-11 DIAGNOSIS — Z96652 Presence of left artificial knee joint: Secondary | ICD-10-CM | POA: Diagnosis not present

## 2016-05-12 DIAGNOSIS — M25562 Pain in left knee: Secondary | ICD-10-CM | POA: Diagnosis not present

## 2016-05-12 DIAGNOSIS — Z96652 Presence of left artificial knee joint: Secondary | ICD-10-CM | POA: Diagnosis not present

## 2016-05-12 DIAGNOSIS — R2689 Other abnormalities of gait and mobility: Secondary | ICD-10-CM | POA: Diagnosis not present

## 2016-05-12 DIAGNOSIS — M62552 Muscle wasting and atrophy, not elsewhere classified, left thigh: Secondary | ICD-10-CM | POA: Diagnosis not present

## 2016-05-17 DIAGNOSIS — M25562 Pain in left knee: Secondary | ICD-10-CM | POA: Diagnosis not present

## 2016-05-17 DIAGNOSIS — M62552 Muscle wasting and atrophy, not elsewhere classified, left thigh: Secondary | ICD-10-CM | POA: Diagnosis not present

## 2016-05-17 DIAGNOSIS — Z96652 Presence of left artificial knee joint: Secondary | ICD-10-CM | POA: Diagnosis not present

## 2016-05-17 DIAGNOSIS — R2689 Other abnormalities of gait and mobility: Secondary | ICD-10-CM | POA: Diagnosis not present

## 2016-05-19 DIAGNOSIS — M62552 Muscle wasting and atrophy, not elsewhere classified, left thigh: Secondary | ICD-10-CM | POA: Diagnosis not present

## 2016-05-19 DIAGNOSIS — Z96652 Presence of left artificial knee joint: Secondary | ICD-10-CM | POA: Diagnosis not present

## 2016-05-19 DIAGNOSIS — M25562 Pain in left knee: Secondary | ICD-10-CM | POA: Diagnosis not present

## 2016-05-19 DIAGNOSIS — R2689 Other abnormalities of gait and mobility: Secondary | ICD-10-CM | POA: Diagnosis not present

## 2016-05-23 DIAGNOSIS — Z96652 Presence of left artificial knee joint: Secondary | ICD-10-CM | POA: Diagnosis not present

## 2016-05-23 DIAGNOSIS — M25562 Pain in left knee: Secondary | ICD-10-CM | POA: Diagnosis not present

## 2016-05-23 DIAGNOSIS — R2689 Other abnormalities of gait and mobility: Secondary | ICD-10-CM | POA: Diagnosis not present

## 2016-05-23 DIAGNOSIS — M62552 Muscle wasting and atrophy, not elsewhere classified, left thigh: Secondary | ICD-10-CM | POA: Diagnosis not present

## 2016-05-25 DIAGNOSIS — M25562 Pain in left knee: Secondary | ICD-10-CM | POA: Diagnosis not present

## 2016-05-25 DIAGNOSIS — Z96652 Presence of left artificial knee joint: Secondary | ICD-10-CM | POA: Diagnosis not present

## 2016-05-25 DIAGNOSIS — M62552 Muscle wasting and atrophy, not elsewhere classified, left thigh: Secondary | ICD-10-CM | POA: Diagnosis not present

## 2016-05-25 DIAGNOSIS — R2689 Other abnormalities of gait and mobility: Secondary | ICD-10-CM | POA: Diagnosis not present

## 2016-05-30 DIAGNOSIS — M62552 Muscle wasting and atrophy, not elsewhere classified, left thigh: Secondary | ICD-10-CM | POA: Diagnosis not present

## 2016-05-30 DIAGNOSIS — M25562 Pain in left knee: Secondary | ICD-10-CM | POA: Diagnosis not present

## 2016-05-30 DIAGNOSIS — R2689 Other abnormalities of gait and mobility: Secondary | ICD-10-CM | POA: Diagnosis not present

## 2016-05-30 DIAGNOSIS — Z96652 Presence of left artificial knee joint: Secondary | ICD-10-CM | POA: Diagnosis not present

## 2016-06-01 DIAGNOSIS — M62552 Muscle wasting and atrophy, not elsewhere classified, left thigh: Secondary | ICD-10-CM | POA: Diagnosis not present

## 2016-06-01 DIAGNOSIS — R2689 Other abnormalities of gait and mobility: Secondary | ICD-10-CM | POA: Diagnosis not present

## 2016-06-01 DIAGNOSIS — M25562 Pain in left knee: Secondary | ICD-10-CM | POA: Diagnosis not present

## 2016-06-01 DIAGNOSIS — Z96652 Presence of left artificial knee joint: Secondary | ICD-10-CM | POA: Diagnosis not present

## 2016-06-02 DIAGNOSIS — Z471 Aftercare following joint replacement surgery: Secondary | ICD-10-CM | POA: Diagnosis not present

## 2016-06-02 DIAGNOSIS — Z96652 Presence of left artificial knee joint: Secondary | ICD-10-CM | POA: Diagnosis not present

## 2016-07-28 DIAGNOSIS — I10 Essential (primary) hypertension: Secondary | ICD-10-CM | POA: Diagnosis not present

## 2016-07-28 DIAGNOSIS — I2510 Atherosclerotic heart disease of native coronary artery without angina pectoris: Secondary | ICD-10-CM | POA: Diagnosis not present

## 2016-07-28 DIAGNOSIS — E785 Hyperlipidemia, unspecified: Secondary | ICD-10-CM | POA: Diagnosis not present

## 2016-07-30 ENCOUNTER — Other Ambulatory Visit: Payer: Self-pay | Admitting: Cardiology

## 2016-11-13 ENCOUNTER — Encounter: Payer: Self-pay | Admitting: Interventional Cardiology

## 2016-11-13 ENCOUNTER — Ambulatory Visit (INDEPENDENT_AMBULATORY_CARE_PROVIDER_SITE_OTHER): Payer: BLUE CROSS/BLUE SHIELD | Admitting: Interventional Cardiology

## 2016-11-13 VITALS — BP 160/100 | HR 100 | Ht 72.0 in | Wt 233.0 lb

## 2016-11-13 DIAGNOSIS — I10 Essential (primary) hypertension: Secondary | ICD-10-CM | POA: Diagnosis not present

## 2016-11-13 DIAGNOSIS — I25110 Atherosclerotic heart disease of native coronary artery with unstable angina pectoris: Secondary | ICD-10-CM

## 2016-11-13 DIAGNOSIS — I255 Ischemic cardiomyopathy: Secondary | ICD-10-CM | POA: Diagnosis not present

## 2016-11-13 DIAGNOSIS — I5042 Chronic combined systolic (congestive) and diastolic (congestive) heart failure: Secondary | ICD-10-CM

## 2016-11-13 NOTE — Patient Instructions (Signed)
Medication Instructions:  None  Labwork: None  Testing/Procedures: None  Follow-Up: Your physician wants you to follow-up in: 1 year with Dr. Tamala Julian. You will receive a reminder letter in the mail two months in advance. If you don't receive a letter, please call our office to schedule the follow-up appointment.   Any Other Special Instructions Will Be Listed Below (If Applicable).  Your target total cholesterol is 170 or less.  Target LDL (bad) cholesterol is less than 70.    If you need a refill on your cardiac medications before your next appointment, please call your pharmacy.

## 2016-11-13 NOTE — Progress Notes (Signed)
Cardiology Office Note    Date:  11/13/2016   ID:  David Chang, DOB [birth year], MRN [id number]  PCP:  Ernestene Kiel, MD  Cardiologist: Sinclair Grooms, MD   Chief Complaint  Patient presents with  . Coronary Artery Disease    History of Present Illness:  David Chang is a 56 y.o. male who presents for CAD with multivessel DES including RCA, circumflex, and LAD. Hyperlipidemia not currently treated with statin therapy due to musculoskeletal syndrome, hypertension, and ischemic left ventricular dysfunction(EF 45-50%).  He is asymptomatic. He has no medication side effects. He denies angina. There's no lower extremity swelling. He denies medication side effects. Currently on dual antiplatelet therapy with aspirin and Plavix.   Past Medical History:  Diagnosis Date  . Arthritis   . Coronary artery disease   . Hyperlipidemia LDL goal <70 04/10/2015  . Hypertension   . Ischemic cardiomyopathy 04/10/2015  . S/P angioplasty with stent to mLAD DES, pRCA DES, mLCX DES 04/08/15  04/10/2015    Past Surgical History:  Procedure Laterality Date  . CARDIAC CATHETERIZATION N/A 04/08/2015   Procedure: Left Heart Cath and Coronary Angiography;  Surgeon: Leonie Man, MD;  Location: [facility];  Service: Cardiovascular;  Laterality: N/A;  . KNEE SURGERY Left   . left tricep surgery     . TOTAL KNEE ARTHROPLASTY Left 04/24/2016   Procedure: LEFT TOTAL KNEE ARTHROPLASTY;  Surgeon: Gaynelle Arabian, MD;  Location: [facility];  Service: Orthopedics;  Laterality: Left;    Current Medications: Outpatient Medications Prior to Visit  Medication Sig Dispense Refill  . acetaminophen (TYLENOL) 325 MG tablet Take 2 tablets (650 mg total) by mouth every 4 (four) hours as needed for headache or mild pain.    Marland Kitchen amLODipine (NORVASC) 10 MG tablet TAKE ONE TABLET BY MOUTH ONCE DAILY 30 tablet 4  . aspirin EC 325 MG EC tablet Take 1 tablet (325 mg total) by mouth daily with breakfast.  Take daily for three weeks and then rduce back to a baby 81 mg Aspirin daily 21 tablet 0  . clopidogrel (PLAVIX) 75 MG tablet Take 1 tablet (75 mg total) by mouth daily. 90 tablet 3  . losartan (COZAAR) 100 MG tablet Take 100 mg by mouth daily.  1  . metoprolol succinate (TOPROL-XL) 50 MG 24 hr tablet TAKE ONE TABLET BY MOUTH ONCE DAILY. TAKE WITH OR IMMEDIATELY FOLLOWING A MEAL. 90 tablet 3  . nitroGLYCERIN (NITROSTAT) 0.4 MG SL tablet Place 1 tablet (0.4 mg total) under the tongue every 5 (five) minutes as needed for chest pain. 25 tablet 4  . methocarbamol (ROBAXIN) 500 MG tablet Take 1 tablet (500 mg total) by mouth every 6 (six) hours as needed for muscle spasms. (Patient not taking: Reported on 11/13/2016) 80 tablet 0  . metoprolol succinate (TOPROL-XL) 100 MG 24 hr tablet Take 100 mg by mouth daily. Take with or immediately following a meal.    . oxyCODONE (OXY IR/ROXICODONE) 5 MG immediate release tablet Take 1-2 tablets (5-10 mg total) by mouth every 3 (three) hours as needed for moderate pain or severe pain. (Patient not taking: Reported on 11/13/2016) 90 tablet 0  . traMADol (ULTRAM) 50 MG tablet Take 1-2 tablets (50-100 mg total) by mouth every 6 (six) hours as needed (mild pain). (Patient not taking: Reported on 11/13/2016) 80 tablet 1   No facility-administered medications prior to visit.      Allergies:   Patient has no known allergies.  Social History   Social History  . Marital status: Married    Spouse name: N/A  . Number of children: N/A  . Years of education: N/A   Social History Main Topics  . Smoking status: Never Smoker  . Smokeless tobacco: Never Used  . Alcohol use 0.0 oz/week     Comment: casual drinking on Saturday   . Drug use: No  . Sexual activity: Yes   Other Topics Concern  . None   Social History Narrative  . None     Family History:  The patient's family history includes CVA in his other; Cancer (age of onset: 37) in his father; Heart attack (age  of onset: 7) in his paternal grandfather; Hypertension in his sister.   ROS:   Please see the history of present illness.    Desires natural therapy for lipids and blood pressure ethanol possible. Status post left knee, stable and improved. Back to full physical activity.  All other systems reviewed and are negative.   PHYSICAL EXAM:   VS:  BP (!) 160/100   Pulse 100   Ht 6' (1.829 m)   Wt 233 lb (105.7 kg)   BMI 31.60 kg/m    GEN: Well nourished, well developed, in no acute distress  HEENT: normal  Neck: no JVD, carotid bruits, or masses Cardiac: RRR; no murmurs, rubs, or gallops,no edema  Respiratory:  clear to auscultation bilaterally, normal work of breathing GI: soft, nontender, nondistended, + BS MS: no deformity or atrophy  Skin: warm and dry, no rash Neuro:  Alert and Oriented x 3, Strength and sensation are intact Psych: euthymic mood, full affect  Wt Readings from Last 3 Encounters:  11/13/16 233 lb (105.7 kg)  04/24/16 233 lb (105.7 kg)  04/20/16 233 lb (105.7 kg)      Studies/Labs Reviewed:   EKG:  EKG  none  Recent Labs: 04/17/2016: ALT 40 04/25/2016: BUN 24; Creatinine, Ser 1.47; Hemoglobin 14.3; Platelets 146; Potassium 4.6; Sodium 135   Lipid Panel    Component Value Date/Time   CHOL 251 (H) 04/08/2015 0545   TRIG 124 04/08/2015 0545   HDL 23 (L) 04/08/2015 0545   CHOLHDL 10.9 04/08/2015 0545   VLDL 25 04/08/2015 0545   LDLCALC 203 (H) 04/08/2015 0545    Additional studies/ records that were reviewed today include:  None Last lipid panel in September 2017 demonstrated an LDL of 129 total cholesterol of 193. Patient is resistant to therapy of any sort    ASSESSMENT:    1. Coronary artery disease involving native coronary artery of native heart with unstable angina pectoris ([place])   2. Chronic combined systolic and diastolic heart failure, NYHA class 2 ([place])   3. Ischemic cardiomyopathy   4. Hypertension, essential      PLAN:  In order  of problems listed above:  1. Multivessel stenting formed in 2016, November. Asymptomatic since that time. Not at target with risk factors. Needs blood pressure less than 140/90 mmHg and LDL less than 70. He has not needed nitroglycerin. He is very physically active. 2. Heart failure therapy includes ARB and beta blocker. Blood pressure is not optimal today. We discussed further intensification but he is inclined to attempt further blood pressure lowering with exercise and diet. Last EF 45-50%. 3. As above. 4. Target blood pressure less than 140/90 mmHg. Currently higher than it should be but states he has not yet had his a.m. dose of medication.  He wants his primary physician to  obtain lipids when he sees her in a few weeks. We will based therapy on these values. Most recent lipid panel had an LDL of 129. Attempted to start a statin today but he wants to wait and see what it is now because he feels his diet and exercise are improved compared to prior.    Medication Adjustments/Labs and Tests Ordered: Current medicines are reviewed at length with the patient today.  Concerns regarding medicines are outlined above.  Medication changes, Labs and Tests ordered today are listed in the Patient Instructions below. There are no Patient Instructions on file for this visit.   Signed, Sinclair Grooms, MD  11/13/2016 9:23 AM    [address], [place], [place]  [address] Phone: [phone]; Fax: [phone]

## 2016-12-29 ENCOUNTER — Other Ambulatory Visit: Payer: Self-pay | Admitting: Interventional Cardiology

## 2017-01-03 DIAGNOSIS — J0140 Acute pansinusitis, unspecified: Secondary | ICD-10-CM | POA: Diagnosis not present

## 2017-05-01 ENCOUNTER — Other Ambulatory Visit: Payer: Self-pay | Admitting: Interventional Cardiology

## 2017-05-07 ENCOUNTER — Other Ambulatory Visit: Payer: Self-pay | Admitting: Interventional Cardiology

## 2017-05-08 DIAGNOSIS — I2510 Atherosclerotic heart disease of native coronary artery without angina pectoris: Secondary | ICD-10-CM | POA: Diagnosis not present

## 2017-05-08 DIAGNOSIS — Z79899 Other long term (current) drug therapy: Secondary | ICD-10-CM | POA: Diagnosis not present

## 2017-05-08 DIAGNOSIS — E785 Hyperlipidemia, unspecified: Secondary | ICD-10-CM | POA: Diagnosis not present

## 2017-05-08 DIAGNOSIS — I10 Essential (primary) hypertension: Secondary | ICD-10-CM | POA: Diagnosis not present

## 2017-06-07 DIAGNOSIS — R7989 Other specified abnormal findings of blood chemistry: Secondary | ICD-10-CM | POA: Diagnosis not present

## 2017-06-07 DIAGNOSIS — R748 Abnormal levels of other serum enzymes: Secondary | ICD-10-CM | POA: Diagnosis not present

## 2017-10-01 DIAGNOSIS — J302 Other seasonal allergic rhinitis: Secondary | ICD-10-CM | POA: Diagnosis not present

## 2017-10-25 IMAGING — DX DG CHEST 1V
1 series · 1 of 1 positions shown · non-contrast
Comparison: None available

CLINICAL DATA: Chest pain today after working out pt had abnormal
EKG at [HOSPITAL]

EXAM:
CHEST  1 VIEW

[chest ap]
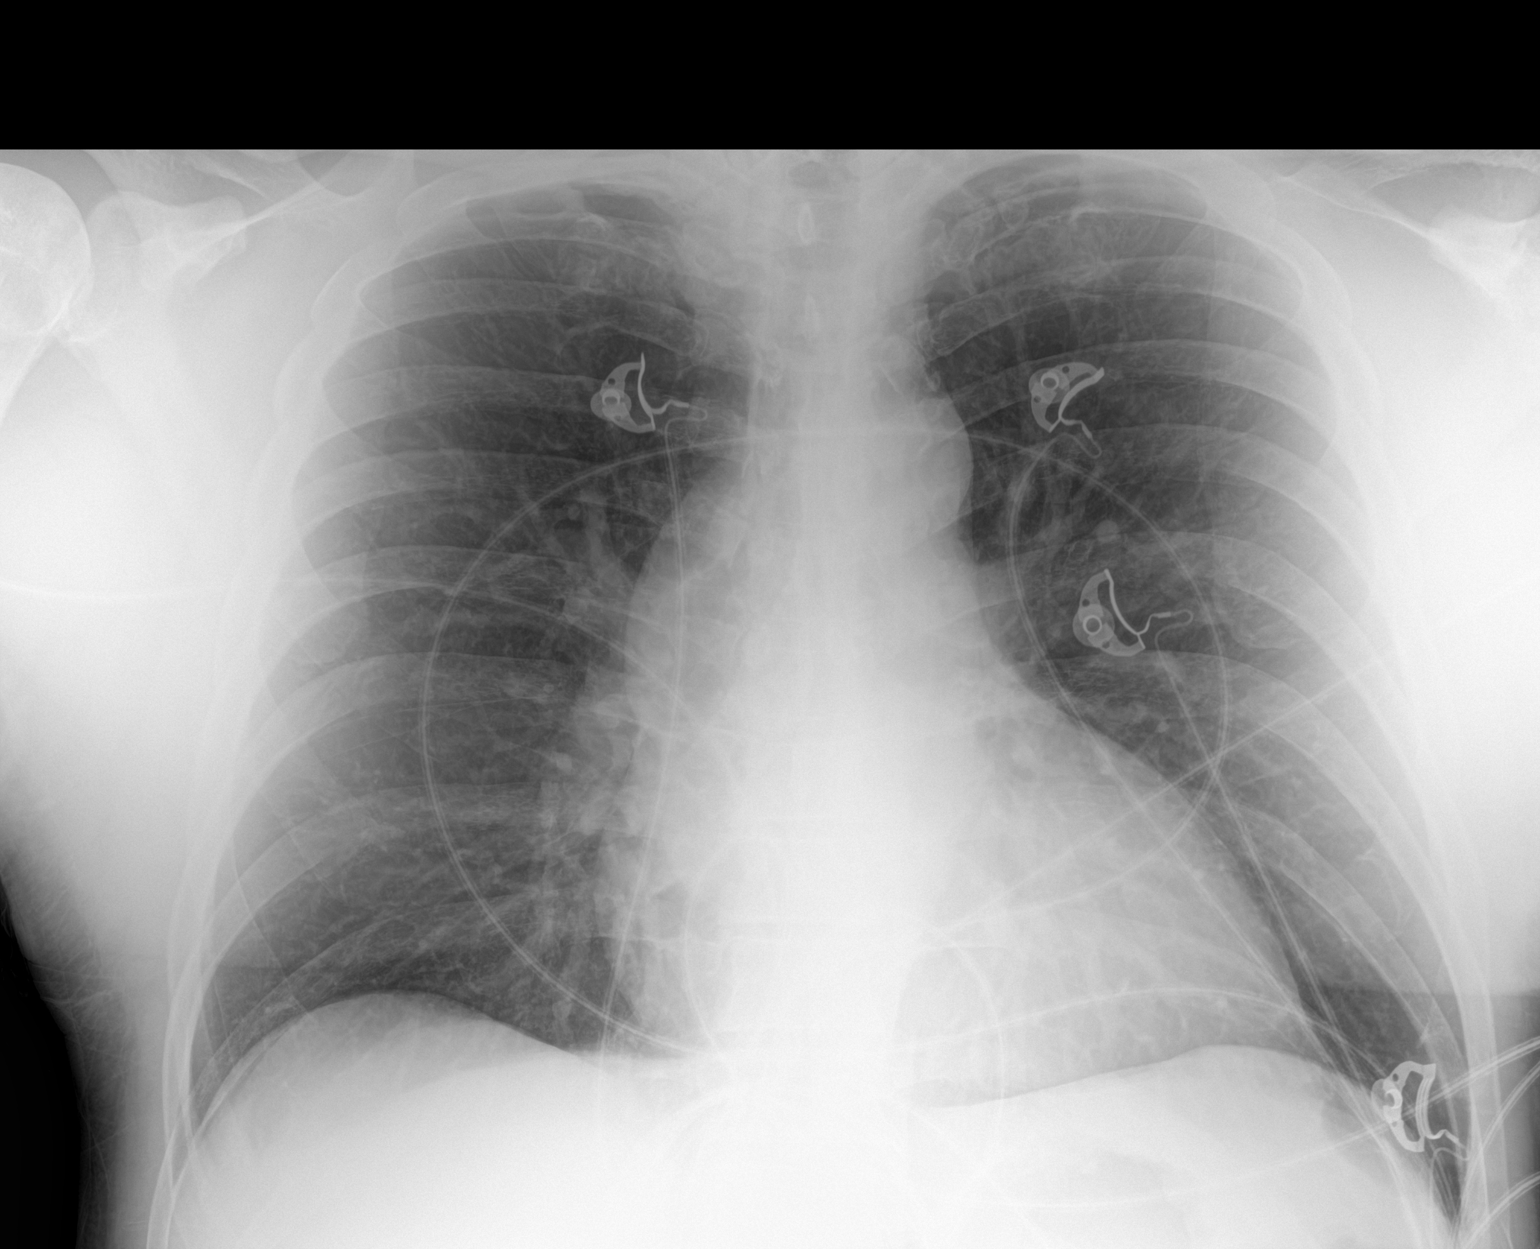

[1 of 1 positions shown; findings below may reference images not displayed]

FINDINGS: Lungs clear. Heart size upper limits normal for technique. Tortuous
thoracic aorta. No pneumothorax.
No effusion.
Visualized skeletal structures are unremarkable.
IMPRESSION: No acute cardiopulmonary disease.

## 2017-10-26 ENCOUNTER — Other Ambulatory Visit: Payer: Self-pay | Admitting: Interventional Cardiology

## 2017-10-29 ENCOUNTER — Other Ambulatory Visit: Payer: Self-pay | Admitting: Interventional Cardiology

## 2017-10-30 ENCOUNTER — Other Ambulatory Visit: Payer: Self-pay | Admitting: Interventional Cardiology

## 2017-11-15 DIAGNOSIS — M25561 Pain in right knee: Secondary | ICD-10-CM | POA: Diagnosis not present

## 2017-11-15 DIAGNOSIS — Z96652 Presence of left artificial knee joint: Secondary | ICD-10-CM | POA: Diagnosis not present

## 2017-11-15 DIAGNOSIS — M1711 Unilateral primary osteoarthritis, right knee: Secondary | ICD-10-CM | POA: Diagnosis not present

## 2017-12-01 DIAGNOSIS — M79672 Pain in left foot: Secondary | ICD-10-CM | POA: Diagnosis not present

## 2017-12-01 DIAGNOSIS — M66862 Spontaneous rupture of other tendons, left lower leg: Secondary | ICD-10-CM | POA: Diagnosis not present

## 2017-12-03 ENCOUNTER — Telehealth: Payer: Self-pay

## 2017-12-03 NOTE — Telephone Encounter (Signed)
   Primary Cardiologist: Dr. Tamala Julian Chart reviewed as part of pre-operative protocol coverage. Because of Eiden Bagot Becraft's past medical history and time since last visit, he/she will require a follow-up visit in order to better assess preoperative cardiovascular risk.  Pre-op covering staff: - Please schedule appointment and call patient to inform them. - Please contact requesting surgeon's office via preferred method (i.e, phone, fax) to inform them of need for appointment prior to surgery.  Truitt Merle, NP  12/03/2017, 3:43 PM

## 2017-12-03 NOTE — Progress Notes (Signed)
NEED ORDERS IN Epic FOR 7-28 SURGERY ASAP THANKS

## 2017-12-03 NOTE — Telephone Encounter (Signed)
   [facility] Pre-operative Risk Assessment    Request for surgical clearance:  1. What type of surgery is being performed? Left Achilles Tendon Repair  2. When is this surgery scheduled? December 06, 2017  3. What type of clearance is required Both  4. Are there any medications that need to be held prior to surgery and how long?Plavix 75 mg  5. Practice name and name of physician performing surgery?[facility], Dr Gladstone Lighter 6.  What is your office phone [phone]   7.   What is your office fax number [phone]  8.   Anesthesia type (None, local, MAC, general) ? General   Shella Maxim Chang 12/03/2017, 2:41 PM  _________________________________________________________________   (provider comments below)

## 2017-12-03 NOTE — Telephone Encounter (Signed)
I have spoken to the patient personally. He has not been seen in over a year - technically needs a visit to clear.   He has no current cardiac symptoms.He works out regularly at [employer] prior to this injury.  He has already stopped his Plavix - he was going to do 5 days (was told this in the past).   His surgery is later this week.   Appointment was given for tomorrow.   Truitt Merle, RN, ANP-C

## 2017-12-04 ENCOUNTER — Encounter: Payer: Self-pay | Admitting: Cardiology

## 2017-12-04 ENCOUNTER — Ambulatory Visit: Payer: BLUE CROSS/BLUE SHIELD | Admitting: Cardiology

## 2017-12-04 VITALS — BP 140/92 | HR 89 | Ht 72.0 in | Wt 225.0 lb

## 2017-12-04 DIAGNOSIS — I2510 Atherosclerotic heart disease of native coronary artery without angina pectoris: Secondary | ICD-10-CM | POA: Diagnosis not present

## 2017-12-04 DIAGNOSIS — Z01818 Encounter for other preprocedural examination: Secondary | ICD-10-CM | POA: Diagnosis not present

## 2017-12-04 DIAGNOSIS — E785 Hyperlipidemia, unspecified: Secondary | ICD-10-CM | POA: Diagnosis not present

## 2017-12-04 DIAGNOSIS — I10 Essential (primary) hypertension: Secondary | ICD-10-CM

## 2017-12-04 DIAGNOSIS — I214 Non-ST elevation (NSTEMI) myocardial infarction: Secondary | ICD-10-CM | POA: Diagnosis not present

## 2017-12-04 NOTE — Progress Notes (Signed)
12/04/2017 David Chang   [birth year]  [id number]  Primary Physician Ernestene Kiel, MD Primary Cardiologist: Dr. Tamala Julian   Reason for Visit/CC: Pre operative cardiovascular assessment  HPI:  David Chang is a 57 y.o. male who is being seen today for preoperative cardiovascular assessment prior to undergoing Achilles tendon repair after traumatic rupture.  Patient is followed by Dr. Tamala Julian.  History includes CAD with multivessel DES including RCA, circumflex, and LAD in 2016 for NSTEMI. Hyperlipidemia not currently treated with statin therapy due to musculoskeletal syndrome, hypertension, and ischemic left ventricular dysfunction(EF 45-50%).  His last PCi was in 2016. Last seen by Dr. Tamala Julian in June 2018 and was doing well from a cardiovascular standpoint without anginal symptomatology.  Today, he reports that he has done well. He denies any anginal symptoms. He is very fit and physically active. He ruptured his left achilles and gastrocnemius while vigorously exercising. He was running up a flight of stairs on 11/22/17 with achilles ruptured. Prior to injury, he was working out vigorously, 5 days a week, 2 hr work-outs. Combination of cardio and weight lifting. He denies experiencing any exertional CP or dyspnea with heavy activities. He has not required SL NTG since his MI. He reports full med compliance. BP is mildly elevated in clinic today but pt with 7/10 leg pain and was rushing to get to OV. He also notes that he checks his BP at home and typically runs in the 831D systolic/ 17O diastolic. His surgery is 7/18 and he stopped ASA and Plavix several days ago. He is not fasting today.    Cardiac Studies 1. Severe 3 vessel CAD with 3 successful PCI. 2. Mid LAD lesion, 90% stenosed. PCI with a Promus Premier DES 3.5 mm x 16 mm (3.8 mm). Post intervention, there is a 0% residual stenosis. 3. Prox RCA lesion, 90% stenosed. PCI with a Promus Premier DES 4.0 mm x 20 mm (4.3 mm). Post  intervention, there is a 0% residual stenosis. 4. Mid Cx lesion, 95% stenosed. PCI with a Promus Premier DES 3.0 mm x 16 mm (3.4 mm). Post intervention, there is a 0% residual stenosis. 5. Mid RCA lesion, 45% stenosed. 6. Mildly elevated LVEDP    Successful 3 vessel PCI providing complete revascularization following non-ST elevation MI.  2D Echo 03/2015 Study Conclusions  - Procedure narrative: Transthoracic echocardiography. Image   quality was poor. The study was technically difficult, as a   result of poor acoustic windows, poor sound wave transmission,   restricted patient mobility, and body habitus. - Left ventricle: The cavity size was normal. Wall thickness was   increased in a pattern of moderate LVH. Systolic function was   mildly reduced. The estimated ejection fraction was in the range   of 45% to 50%. Regional wall motion abnormalities cannot be   excluded. Doppler parameters are consistent with abnormal left   ventricular relaxation (grade 1 diastolic dysfunction). - Aortic valve: There was mild regurgitation. - Mitral valve: There was mild regurgitation. - Left atrium: The atrium was moderately dilated.     Current Meds  Medication Sig  . acetaminophen (TYLENOL) 500 MG tablet Take 500 mg by mouth daily as needed for moderate pain or headache.   Marland Kitchen amLODipine (NORVASC) 10 MG tablet TAKE 1 TABLET BY MOUTH ONCE DAILY  . aspirin EC 81 MG tablet Take 81 mg by mouth daily.  . clopidogrel (PLAVIX) 75 MG tablet TAKE ONE TABLET BY MOUTH DAILY  . diphenhydrAMINE (BENADRYL) 50 MG tablet  Take 50-100 mg by mouth at bedtime as needed for sleep.  Marland Kitchen ibuprofen (ADVIL,MOTRIN) 200 MG tablet Take 800 mg by mouth daily as needed for headache or moderate pain.  Marland Kitchen losartan (COZAAR) 100 MG tablet Take 100 mg by mouth daily.  . metoprolol succinate (TOPROL-XL) 100 MG 24 hr tablet Take 100 mg by mouth daily. Take with or immediately following a meal.  . metoprolol succinate (TOPROL-XL) 50  MG 24 hr tablet TAKE 1 TABLET BY MOUTH ONCE DAILY. TAKE  WITH  OR  IMMEDIATELY  FOLLOWING  A  MEAL  . milk thistle 175 MG tablet Take 175 mg by mouth daily.  . naphazoline-pheniramine (NAPHCON-A) 0.025-0.3 % ophthalmic solution Place 1 drop into both eyes 2 (two) times daily as needed for eye irritation.  . nitroGLYCERIN (NITROSTAT) 0.4 MG SL tablet Place 1 tablet (0.4 mg total) under the tongue every 5 (five) minutes as needed for chest pain.  . Omega-3 Fatty Acids (FISH OIL) 1000 MG CAPS Take 1,000 mg by mouth daily.  Marland Kitchen omeprazole (PRILOSEC) 20 MG capsule Take 20 mg by mouth daily as needed (acid reflux).   No Known Allergies Past Medical History:  Diagnosis Date  . Arthritis   . Coronary artery disease   . Hyperlipidemia LDL goal <70 04/10/2015  . Hypertension   . Ischemic cardiomyopathy 04/10/2015  . S/P angioplasty with stent to mLAD DES, pRCA DES, mLCX DES 04/08/15  04/10/2015   Family History  Problem Relation Age of Onset  . CVA Other   . Cancer Father 69       LUNG  . Heart attack Paternal Grandfather 57  . Hypertension Sister    Past Surgical History:  Procedure Laterality Date  . CARDIAC CATHETERIZATION N/A 04/08/2015   Procedure: Left Heart Cath and Coronary Angiography;  Surgeon: Leonie Man, MD;  Location: [facility];  Service: Cardiovascular;  Laterality: N/A;  . KNEE SURGERY Left   . left tricep surgery     . TOTAL KNEE ARTHROPLASTY Left 04/24/2016   Procedure: LEFT TOTAL KNEE ARTHROPLASTY;  Surgeon: Gaynelle Arabian, MD;  Location: [facility];  Service: Orthopedics;  Laterality: Left;   Social History   Socioeconomic History  . Marital status: Married    Spouse name: Not on file  . Number of children: Not on file  . Years of education: Not on file  . Highest education level: Not on file  Occupational History  . Not on file  Social Needs  . Financial resource strain: Not on file  . Food insecurity:    Worry: Not on file    Inability: Not on file  .  Transportation needs:    Medical: Not on file    Non-medical: Not on file  Tobacco Use  . Smoking status: Never Smoker  . Smokeless tobacco: Never Used  Substance and Sexual Activity  . Alcohol use: Yes    Alcohol/week: 0.0 oz    Comment: casual drinking on Saturday   . Drug use: No  . Sexual activity: Yes  Lifestyle  . Physical activity:    Days per week: Not on file    Minutes per session: Not on file  . Stress: Not on file  Relationships  . Social connections:    Talks on phone: Not on file    Gets together: Not on file    Attends religious service: Not on file    Active member of club or organization: Not on file    Attends meetings  of clubs or organizations: Not on file    Relationship status: Not on file  . Intimate partner violence:    Fear of current or ex partner: Not on file    Emotionally abused: Not on file    Physically abused: Not on file    Forced sexual activity: Not on file  Other Topics Concern  . Not on file  Social History Narrative  . Not on file     Review of Systems: General: negative for chills, fever, night sweats or weight changes.  Cardiovascular: negative for chest pain, dyspnea on exertion, edema, orthopnea, palpitations, paroxysmal nocturnal dyspnea or shortness of breath Dermatological: negative for rash Respiratory: negative for cough or wheezing Urologic: negative for hematuria Abdominal: negative for nausea, vomiting, diarrhea, bright red blood per rectum, melena, or hematemesis Neurologic: negative for visual changes, syncope, or dizziness All other systems reviewed and are otherwise negative except as noted above.   Physical Exam:  Blood pressure (!) 140/92, pulse 89, height 6' (1.829 m), weight 225 lb (102.1 kg), SpO2 98 %.  General appearance: alert, cooperative, no distress and physically fit appearing male Neck: no carotid bruit and no JVD Lungs: clear to auscultation bilaterally Heart: regular rate and rhythm, S1, S2 normal,  no murmur, click, rub or gallop Extremities: no LEE, left LE in boot Pulses: 2+ and symmetric Skin: Skin color, texture, turgor normal. No rashes or lesions Neurologic: Grossly normal  EKG SR w/ PVCs -- personally reviewed   ASSESSMENT AND PLAN:   1. CAD: s/p NSTEMI in 2016 treated with multivessel PCI w/ DES to the RCA, circumflex and LAD. Stable w/o anginal symptoms. Continue medical therapy w/ BB. No statin due to intolerance. ASA and Plavix currently on hold, but will resume post surgery. Will check CBC for medication monitoring given DAPT.   2. Ischemic Cardiomyopathy: EF 45-50% following NSTEMI in 2016. Denies dyspnea, orthopnea, PND and LEE. Volume is stable. Continue BB and ARB  3. HTN: mildly elevated, but in the setting of leg/ankle pain. 140/92. Continue current meds. Pt will continue to monitor BP at home. Hopefully BP will improve post surgery. Check BMP given ARB therapy.   4. HLD: statin intolerant. Has been trying to control levels through diet and exercise. Also takes fish oil. He is due for fasting labs and HFTs. We will order.   5. Preoperative Assessment: pt denies any anginal symptomology. Prior to recent injury, he was able to vigorously exercise 5 days a week, 2 hr work-outs w/o exertional CP or dyspnea, > 4 METS of physical activity. EKG nonischemic and physical exam is benign. He can be cleared for noncardiac surgery w/o need for further cardiac testing. Ok to hold ASA and Plavix and resume once safe to do so from a surgical standpoint. Continue BB during the perioperative period.   PLAN  Cleared for surgery. Clearance form given to pt.   Follow-Up w/ Dr. Tamala Julian in 1 year.   Shamiya Demeritt Ladoris Gene, MHS [facility] 12/04/2017 9:17 AM

## 2017-12-04 NOTE — Patient Instructions (Addendum)
David Chang  12/04/2017   Your procedure is scheduled on: 12-06-17  Report to [facility] Main  Entrance  Report to admitting at       1130 AM    Call this number if you have problems the morning of surgery [phone]    Remember: Do not eat food  :After Midnight.you may have clears until 0730 am then nothing by mouth    CLEAR LIQUID DIET   Foods Allowed                                                                     Foods Excluded  Coffee and tea, regular and decaf                             liquids that you cannot  Plain Jell-O in any flavor                                             see through such as: Fruit ices (not with fruit pulp)                                     milk, soups, orange juice  Iced Popsicles                                    All solid food Carbonated beverages, regular and diet                                    Cranberry, grape and apple juices Sports drinks like Gatorade Lightly seasoned clear broth or consume(fat free) Sugar, honey syrup _____________________________________________________________________   Take these medicines the morning of surgery with A SIP OF WATER: metoprolol, amlodipine                                You may not have any metal on your body including hair pins and              piercings  Do not wear jewelry,  lotions, powders or perfumes, deodorant                Men may shave face and neck.   Do not bring valuables to the hospital. [place].  Contacts, dentures or bridgework may not be worn into surgery.  Leave suitcase in the car. After surgery it may be brought to your room.                Please read over the following fact sheets you were given: _____________________________________________________________________  [facility] - Preparing for Surgery Before surgery, you can play an important role.  Because skin is not  sterile, your skin needs to be as free of germs as possible.  You can reduce the number of germs on your skin by washing with CHG (chlorahexidine gluconate) soap before surgery.  CHG is an antiseptic cleaner which kills germs and bonds with the skin to continue killing germs even after washing. Please DO NOT use if you have an allergy to CHG or antibacterial soaps.  If your skin becomes reddened/irritated stop using the CHG and inform your nurse when you arrive at Short Stay. Do not shave (including legs and underarms) for at least 48 hours prior to the first CHG shower.  You may shave your face/neck. Please follow these instructions carefully:  1.  Shower with CHG Soap the night before surgery and the  morning of Surgery.  2.  If you choose to wash your hair, wash your hair first as usual with your  normal  shampoo.  3.  After you shampoo, rinse your hair and body thoroughly to remove the  shampoo.                           4.  Use CHG as you would any other liquid soap.  You can apply chg directly  to the skin and wash                       Gently with a scrungie or clean washcloth.  5.  Apply the CHG Soap to your body ONLY FROM THE NECK DOWN.   Do not use on face/ open                           Wound or open sores. Avoid contact with eyes, ears mouth and genitals (private parts).                       Wash face,  Genitals (private parts) with your normal soap.             6.  Wash thoroughly, paying special attention to the area where your surgery  will be performed.  7.  Thoroughly rinse your body with warm water from the neck down.  8.  DO NOT shower/wash with your normal soap after using and rinsing off  the CHG Soap.                9.  Pat yourself dry with a clean towel.            10.  Wear clean pajamas.            11.  Place clean sheets on your bed the night of your first shower and do not  sleep with pets. Day of Surgery : Do not apply any lotions/deodorants the morning of surgery.   Please wear clean clothes to the hospital/surgery center.  FAILURE TO FOLLOW THESE INSTRUCTIONS MAY RESULT IN THE CANCELLATION OF YOUR SURGERY PATIENT SIGNATURE_________________________________  NURSE SIGNATURE__________________________________  ________________________________________________________________________  WHAT IS A BLOOD TRANSFUSION? Blood Transfusion Information  A transfusion is the replacement of blood or some of its parts. Blood is made up of multiple cells which provide different functions.  Red blood cells carry oxygen and are used for blood loss replacement.  White blood cells fight against infection.  Platelets control bleeding.  Plasma helps clot blood.  Other blood products are available for specialized needs, such as hemophilia or other clotting disorders. BEFORE THE TRANSFUSION  Who gives blood for transfusions?   Healthy volunteers who are fully evaluated to make sure their blood is safe. This is blood bank blood. Transfusion therapy is the safest it has ever been in the practice of medicine. Before blood is taken from a donor, a complete history is taken to make sure that person has no history of diseases nor engages in risky social behavior (examples are intravenous drug use or sexual activity with multiple partners). The donor's travel history is screened to minimize risk of transmitting infections, such as malaria. The donated blood is tested for signs of infectious diseases, such as HIV and hepatitis. The blood is then tested to be sure it is compatible with you in order to minimize the chance of a transfusion reaction. If you or a relative donates blood, this is often done in anticipation of surgery and is not appropriate for emergency situations. It takes many days to process the donated blood. RISKS AND COMPLICATIONS Although transfusion therapy is very safe and saves many lives, the main dangers of transfusion include:   Getting an infectious  disease.  Developing a transfusion reaction. This is an allergic reaction to something in the blood you were given. Every precaution is taken to prevent this. The decision to have a blood transfusion has been considered carefully by your caregiver before blood is given. Blood is not given unless the benefits outweigh the risks. AFTER THE TRANSFUSION  Right after receiving a blood transfusion, you will usually feel much better and more energetic. This is especially true if your red blood cells have gotten low (anemic). The transfusion raises the level of the red blood cells which carry oxygen, and this usually causes an energy increase.  The nurse administering the transfusion will monitor you carefully for complications. HOME CARE INSTRUCTIONS  No special instructions are needed after a transfusion. You may find your energy is better. Speak with your caregiver about any limitations on activity for underlying diseases you may have. SEEK MEDICAL CARE IF:   Your condition is not improving after your transfusion.  You develop redness or irritation at the intravenous (IV) site. SEEK IMMEDIATE MEDICAL CARE IF:  Any of the following symptoms occur over the next 12 hours:  Shaking chills.  You have a temperature by mouth above 102 F (38.9 C), not controlled by medicine.  Chest, back, or muscle pain.  People around you feel you are not acting correctly or are confused.  Shortness of breath or difficulty breathing.  Dizziness and fainting.  You get a rash or develop hives.  You have a decrease in urine output.  Your urine turns a dark color or changes to pink, red, or brown. Any of the following symptoms occur over the next 10 days:  You have a temperature by mouth above 102 F (38.9 C), not controlled by medicine.  Shortness of breath.  Weakness after normal activity.  The white part of the eye turns yellow (jaundice).  You have a decrease in the amount of urine or are  urinating less often.  Your urine turns a dark color or changes to pink, red, or brown. Document Released: 05/05/2000 Document Revised: 07/31/2011 Document Reviewed: 12/23/2007 ExitCare Patient Information 2014 [place].  _______________________________________________________________________  Incentive Spirometer  An incentive spirometer is a tool that can help keep your lungs clear and active. This tool measures how  well you are filling your lungs with each breath. Taking long deep breaths may help reverse or decrease the chance of developing breathing (pulmonary) problems (especially infection) following:  A long period of time when you are unable to move or be active. BEFORE THE PROCEDURE   If the spirometer includes an indicator to show your best effort, your nurse or respiratory therapist will set it to a desired goal.  If possible, sit up straight or lean slightly forward. Try not to slouch.  Hold the incentive spirometer in an upright position. INSTRUCTIONS FOR USE  1. Sit on the edge of your bed if possible, or sit up as far as you can in bed or on a chair. 2. Hold the incentive spirometer in an upright position. 3. Breathe out normally. 4. Place the mouthpiece in your mouth and seal your lips tightly around it. 5. Breathe in slowly and as deeply as possible, raising the piston or the ball toward the top of the column. 6. Hold your breath for 3-5 seconds or for as long as possible. Allow the piston or ball to fall to the bottom of the column. 7. Remove the mouthpiece from your mouth and breathe out normally. 8. Rest for a few seconds and repeat Steps 1 through 7 at least 10 times every 1-2 hours when you are awake. Take your time and take a few normal breaths between deep breaths. 9. The spirometer may include an indicator to show your best effort. Use the indicator as a goal to work toward during each repetition. 10. After each set of 10 deep breaths, practice coughing  to be sure your lungs are clear. If you have an incision (the cut made at the time of surgery), support your incision when coughing by placing a pillow or rolled up towels firmly against it. Once you are able to get out of bed, walk around indoors and cough well. You may stop using the incentive spirometer when instructed by your caregiver.  RISKS AND COMPLICATIONS  Take your time so you do not get dizzy or light-headed.  If you are in pain, you may need to take or ask for pain medication before doing incentive spirometry. It is harder to take a deep breath if you are having pain. AFTER USE  Rest and breathe slowly and easily.  It can be helpful to keep track of a log of your progress. Your caregiver can provide you with a simple table to help with this. If you are using the spirometer at home, follow these instructions: [place] IF:   You are having difficultly using the spirometer.  You have trouble using the spirometer as often as instructed.  Your pain medication is not giving enough relief while using the spirometer.  You develop fever of 100.5 F (38.1 C) or higher. SEEK IMMEDIATE MEDICAL CARE IF:   You cough up bloody sputum that had not been present before.  You develop fever of 102 F (38.9 C) or greater.  You develop worsening pain at or near the incision site. MAKE SURE YOU:   Understand these instructions.  Will watch your condition.  Will get help right away if you are not doing well or get worse. Document Released: 09/18/2006 Document Revised: 07/31/2011 Document Reviewed: 11/19/2006 [facility] Patient Information 2014 [place], [place].   ________________________________________________________________________

## 2017-12-04 NOTE — Progress Notes (Signed)
Surgery is on 12/06/2017.  Preop on 12/05/2017.  Need orders in epic.

## 2017-12-04 NOTE — Patient Instructions (Signed)
Your physician recommends that you continue on your current medications as directed. Please refer to the Current Medication list given to you today.   Your physician recommends that you return for lab work in:  [place] BMET , CBC, AND LIPID  Your physician wants you to follow-up in: [place] will receive a reminder letter in the mail two months in advance. If you don't receive a letter, please call our office to schedule the follow-up appointment.

## 2017-12-05 ENCOUNTER — Encounter (HOSPITAL_COMMUNITY): Payer: Self-pay

## 2017-12-05 ENCOUNTER — Encounter (HOSPITAL_COMMUNITY)
Admission: RE | Admit: 2017-12-05 | Discharge: 2017-12-05 | Disposition: A | Discharge status: Home / Self Care | Payer: BLUE CROSS/BLUE SHIELD | Source: Ambulatory Visit | Attending: Orthopedic Surgery | Admitting: Orthopedic Surgery

## 2017-12-05 ENCOUNTER — Other Ambulatory Visit: Payer: Self-pay

## 2017-12-05 DIAGNOSIS — Z01812 Encounter for preprocedural laboratory examination: Secondary | ICD-10-CM | POA: Diagnosis not present

## 2017-12-05 DIAGNOSIS — S86012A Strain of left Achilles tendon, initial encounter: Secondary | ICD-10-CM | POA: Insufficient documentation

## 2017-12-05 DIAGNOSIS — X58XXXA Exposure to other specified factors, initial encounter: Secondary | ICD-10-CM | POA: Diagnosis not present

## 2017-12-05 LAB — CBC WITH DIFFERENTIAL/PLATELET
Basophils Absolute: 0 10*3/uL (ref 0.0–0.1)
Basophils Relative: 0 %
Eosinophils Absolute: 0.3 10*3/uL (ref 0.0–0.7)
Eosinophils Relative: 3 %
HCT: 50.8 % (ref 39.0–52.0)
Hemoglobin: 17.3 g/dL — ABNORMAL HIGH (ref 13.0–17.0)
Lymphocytes Relative: 10 %
Lymphs Abs: 0.9 10*3/uL (ref 0.7–4.0)
MCH: 32.2 pg (ref 26.0–34.0)
MCHC: 34.1 g/dL (ref 30.0–36.0)
MCV: 94.6 fL (ref 78.0–100.0)
Monocytes Absolute: 0.8 10*3/uL (ref 0.1–1.0)
Monocytes Relative: 9 %
Neutro Abs: 7.3 10*3/uL (ref 1.7–7.7)
Neutrophils Relative %: 78 %
Platelets: 208 10*3/uL (ref 150–400)
RBC: 5.37 MIL/uL (ref 4.22–5.81)
RDW: 13.5 % (ref 11.5–15.5)
WBC: 9.4 10*3/uL (ref 4.0–10.5)

## 2017-12-05 LAB — COMPREHENSIVE METABOLIC PANEL
ALT: 37 U/L (ref 0–44)
AST: 37 U/L (ref 15–41)
Albumin: 3.5 g/dL (ref 3.5–5.0)
Alkaline Phosphatase: 39 U/L (ref 38–126)
Anion gap: 8 (ref 5–15)
BUN: 26 mg/dL — ABNORMAL HIGH (ref 6–20)
CO2: 26 mmol/L (ref 22–32)
Calcium: 8.9 mg/dL (ref 8.9–10.3)
Chloride: 103 mmol/L (ref 98–111)
Creatinine, Ser: 1.64 mg/dL — ABNORMAL HIGH (ref 0.61–1.24)
GFR calc Af Amer: 51 mL/min — ABNORMAL LOW (ref >60)
GFR calc non Af Amer: 44 mL/min — ABNORMAL LOW (ref >60)
Glucose, Bld: 100 mg/dL — ABNORMAL HIGH (ref 70–99)
Potassium: 4.5 mmol/L (ref 3.5–5.1)
Sodium: 137 mmol/L (ref 135–145)
Total Bilirubin: 0.8 mg/dL (ref 0.3–1.2)
Total Protein: 6.6 g/dL (ref 6.5–8.1)

## 2017-12-05 LAB — APTT: aPTT: 30 seconds (ref 24–36)

## 2017-12-05 LAB — PROTIME-INR
INR: 0.92
Prothrombin Time: 12.2 seconds (ref 11.4–15.2)

## 2017-12-05 NOTE — Progress Notes (Addendum)
Clearance 12-04-17 Cardiology epic   ekg 12-04-17 epic  Clearance Dr. Lacy Duverney on chart 12-04-17

## 2017-12-05 NOTE — Progress Notes (Signed)
Cmp done 12-05-17 routed to Dr. Gladstone Lighter via epic

## 2017-12-06 ENCOUNTER — Ambulatory Visit (HOSPITAL_COMMUNITY): Payer: BLUE CROSS/BLUE SHIELD | Admitting: Certified Registered Nurse Anesthetist

## 2017-12-06 ENCOUNTER — Encounter (HOSPITAL_COMMUNITY)
Admission: RE | Disposition: A | Discharge status: Home / Self Care | Payer: Self-pay | Source: Ambulatory Visit | Attending: Orthopedic Surgery

## 2017-12-06 ENCOUNTER — Other Ambulatory Visit: Payer: Self-pay

## 2017-12-06 ENCOUNTER — Encounter (HOSPITAL_COMMUNITY): Payer: Self-pay | Admitting: *Deleted

## 2017-12-06 ENCOUNTER — Observation Stay (HOSPITAL_COMMUNITY)
Admission: RE | Admit: 2017-12-06 | Discharge: 2017-12-07 | Disposition: A | Discharge status: Home / Self Care | Payer: BLUE CROSS/BLUE SHIELD | Source: Ambulatory Visit | Attending: Orthopedic Surgery | Admitting: Orthopedic Surgery

## 2017-12-06 DIAGNOSIS — Z955 Presence of coronary angioplasty implant and graft: Secondary | ICD-10-CM | POA: Diagnosis not present

## 2017-12-06 DIAGNOSIS — Z8249 Family history of ischemic heart disease and other diseases of the circulatory system: Secondary | ICD-10-CM | POA: Insufficient documentation

## 2017-12-06 DIAGNOSIS — I252 Old myocardial infarction: Secondary | ICD-10-CM | POA: Diagnosis not present

## 2017-12-06 DIAGNOSIS — I255 Ischemic cardiomyopathy: Secondary | ICD-10-CM | POA: Insufficient documentation

## 2017-12-06 DIAGNOSIS — Z7902 Long term (current) use of antithrombotics/antiplatelets: Secondary | ICD-10-CM | POA: Diagnosis not present

## 2017-12-06 DIAGNOSIS — X58XXXA Exposure to other specified factors, initial encounter: Secondary | ICD-10-CM | POA: Diagnosis not present

## 2017-12-06 DIAGNOSIS — S86012A Strain of left Achilles tendon, initial encounter: Secondary | ICD-10-CM | POA: Diagnosis not present

## 2017-12-06 DIAGNOSIS — S86822A Laceration of other muscle(s) and tendon(s) at lower leg level, left leg, initial encounter: Secondary | ICD-10-CM | POA: Diagnosis not present

## 2017-12-06 DIAGNOSIS — I10 Essential (primary) hypertension: Secondary | ICD-10-CM | POA: Diagnosis not present

## 2017-12-06 DIAGNOSIS — E785 Hyperlipidemia, unspecified: Secondary | ICD-10-CM | POA: Insufficient documentation

## 2017-12-06 DIAGNOSIS — Z79899 Other long term (current) drug therapy: Secondary | ICD-10-CM | POA: Diagnosis not present

## 2017-12-06 DIAGNOSIS — Z7982 Long term (current) use of aspirin: Secondary | ICD-10-CM | POA: Insufficient documentation

## 2017-12-06 DIAGNOSIS — I080 Rheumatic disorders of both mitral and aortic valves: Secondary | ICD-10-CM | POA: Insufficient documentation

## 2017-12-06 DIAGNOSIS — I2510 Atherosclerotic heart disease of native coronary artery without angina pectoris: Secondary | ICD-10-CM | POA: Insufficient documentation

## 2017-12-06 DIAGNOSIS — Z96652 Presence of left artificial knee joint: Secondary | ICD-10-CM | POA: Diagnosis not present

## 2017-12-06 DIAGNOSIS — M1990 Unspecified osteoarthritis, unspecified site: Secondary | ICD-10-CM | POA: Diagnosis not present

## 2017-12-06 DIAGNOSIS — S86812A Strain of other muscle(s) and tendon(s) at lower leg level, left leg, initial encounter: Secondary | ICD-10-CM | POA: Diagnosis not present

## 2017-12-06 DIAGNOSIS — G8918 Other acute postprocedural pain: Secondary | ICD-10-CM | POA: Diagnosis not present

## 2017-12-06 HISTORY — PX: ACHILLES TENDON SURGERY: SHX542

## 2017-12-06 LAB — TYPE AND SCREEN
ABO/RH(D): A POS
Antibody Screen: NEGATIVE

## 2017-12-06 SURGERY — REPAIR, TENDON, ACHILLES
Anesthesia: General | Laterality: Left

## 2017-12-06 MED ORDER — METOPROLOL SUCCINATE ER 50 MG PO TB24
100.0000 mg | ORAL_TABLET | Freq: Every day | ORAL | Status: DC
  Administered 2017-12-07: 100 mg via ORAL
  Filled 2017-12-06: qty 2

## 2017-12-06 MED ORDER — FLEET ENEMA 7-19 GM/118ML RE ENEM: 1.0000 | ENEMA | Freq: Once | RECTAL | Status: DC | PRN

## 2017-12-06 MED ORDER — BUPIVACAINE HCL (PF) 0.5 % IJ SOLN
INTRAMUSCULAR | Status: AC
  Filled 2017-12-06: qty 30

## 2017-12-06 MED ORDER — POLYETHYLENE GLYCOL 3350 17 G PO PACK: 17.0000 g | PACK | Freq: Every day | ORAL | Status: DC | PRN

## 2017-12-06 MED ORDER — PHENYLEPHRINE HCL 10 MG/ML IJ SOLN
INTRAMUSCULAR | Status: AC
  Filled 2017-12-06: qty 1

## 2017-12-06 MED ORDER — DEXAMETHASONE SODIUM PHOSPHATE 10 MG/ML IJ SOLN
INTRAMUSCULAR | Status: DC | PRN
  Administered 2017-12-06: 10 mg via INTRAVENOUS

## 2017-12-06 MED ORDER — NITROGLYCERIN 0.4 MG SL SUBL: 0.4000 mg | SUBLINGUAL_TABLET | SUBLINGUAL | Status: DC | PRN

## 2017-12-06 MED ORDER — LACTATED RINGERS IV SOLN
INTRAVENOUS | Status: DC
  Administered 2017-12-06 (×2): via INTRAVENOUS

## 2017-12-06 MED ORDER — BACITRACIN-NEOMYCIN-POLYMYXIN 400-5-5000 EX OINT
TOPICAL_OINTMENT | CUTANEOUS | Status: DC | PRN
  Administered 2017-12-06: 1 via TOPICAL

## 2017-12-06 MED ORDER — CHLORHEXIDINE GLUCONATE 4 % EX LIQD: 60.0000 mL | Freq: Once | CUTANEOUS | Status: DC

## 2017-12-06 MED ORDER — METOCLOPRAMIDE HCL 5 MG PO TABS: 5.0000 mg | ORAL_TABLET | Freq: Three times a day (TID) | ORAL | Status: DC | PRN

## 2017-12-06 MED ORDER — PROPOFOL 10 MG/ML IV BOLUS
INTRAVENOUS | Status: DC | PRN
  Administered 2017-12-06: 200 mg via INTRAVENOUS

## 2017-12-06 MED ORDER — METOCLOPRAMIDE HCL 5 MG/ML IJ SOLN: 5.0000 mg | Freq: Three times a day (TID) | INTRAMUSCULAR | Status: DC | PRN

## 2017-12-06 MED ORDER — SODIUM CHLORIDE 0.9 % IR SOLN
Status: DC | PRN
  Administered 2017-12-06: 1000 mL

## 2017-12-06 MED ORDER — OXYCODONE HCL 5 MG PO TABS
5.0000 mg | ORAL_TABLET | ORAL | Status: DC | PRN
  Administered 2017-12-07: 5 mg via ORAL
  Filled 2017-12-06: qty 1

## 2017-12-06 MED ORDER — DIPHENHYDRAMINE HCL 25 MG PO CAPS: 50.0000 mg | ORAL_CAPSULE | Freq: Every evening | ORAL | Status: DC | PRN

## 2017-12-06 MED ORDER — ACETAMINOPHEN 500 MG PO TABS
1000.0000 mg | ORAL_TABLET | Freq: Four times a day (QID) | ORAL | Status: DC
  Administered 2017-12-06: 1000 mg via ORAL
  Filled 2017-12-06: qty 2

## 2017-12-06 MED ORDER — LACTATED RINGERS IV SOLN
INTRAVENOUS | Status: DC
  Administered 2017-12-06: 20:00:00 via INTRAVENOUS

## 2017-12-06 MED ORDER — ROCURONIUM BROMIDE 50 MG/5ML IV SOSY
PREFILLED_SYRINGE | INTRAVENOUS | Status: DC | PRN
  Administered 2017-12-06: 60 mg via INTRAVENOUS

## 2017-12-06 MED ORDER — FENTANYL CITRATE (PF) 100 MCG/2ML IJ SOLN
INTRAMUSCULAR | Status: DC | PRN
  Administered 2017-12-06: 100 ug via INTRAVENOUS

## 2017-12-06 MED ORDER — CEFAZOLIN SODIUM-DEXTROSE 1-4 GM/50ML-% IV SOLN
1.0000 g | Freq: Four times a day (QID) | INTRAVENOUS | Status: AC
  Administered 2017-12-06 – 2017-12-07 (×3): 1 g via INTRAVENOUS
  Filled 2017-12-06 (×3): qty 50

## 2017-12-06 MED ORDER — PHENYLEPHRINE 40 MCG/ML (10ML) SYRINGE FOR IV PUSH (FOR BLOOD PRESSURE SUPPORT)
PREFILLED_SYRINGE | INTRAVENOUS | Status: DC | PRN
  Administered 2017-12-06 (×5): 80 ug via INTRAVENOUS

## 2017-12-06 MED ORDER — PROMETHAZINE HCL 25 MG/ML IJ SOLN: 6.2500 mg | INTRAMUSCULAR | Status: DC | PRN

## 2017-12-06 MED ORDER — ONDANSETRON HCL 4 MG/2ML IJ SOLN: 4.0000 mg | Freq: Four times a day (QID) | INTRAMUSCULAR | Status: DC | PRN

## 2017-12-06 MED ORDER — LIDOCAINE 2% (20 MG/ML) 5 ML SYRINGE
INTRAMUSCULAR | Status: AC
  Filled 2017-12-06: qty 10

## 2017-12-06 MED ORDER — PROPOFOL 10 MG/ML IV BOLUS
INTRAVENOUS | Status: AC
  Filled 2017-12-06: qty 40

## 2017-12-06 MED ORDER — HYDROMORPHONE HCL 1 MG/ML IJ SOLN
INTRAMUSCULAR | Status: AC
  Filled 2017-12-06: qty 2

## 2017-12-06 MED ORDER — ACETAMINOPHEN 500 MG PO TABS: 500.0000 mg | ORAL_TABLET | Freq: Every day | ORAL | Status: DC | PRN

## 2017-12-06 MED ORDER — BISACODYL 5 MG PO TBEC: 5.0000 mg | DELAYED_RELEASE_TABLET | Freq: Every day | ORAL | Status: DC | PRN

## 2017-12-06 MED ORDER — HYDROMORPHONE HCL 1 MG/ML IJ SOLN: 0.5000 mg | INTRAMUSCULAR | Status: DC | PRN

## 2017-12-06 MED ORDER — FENTANYL CITRATE (PF) 100 MCG/2ML IJ SOLN
50.0000 ug | INTRAMUSCULAR | Status: AC
  Administered 2017-12-06: 100 ug via INTRAVENOUS
  Filled 2017-12-06: qty 2

## 2017-12-06 MED ORDER — ONDANSETRON HCL 4 MG PO TABS: 4.0000 mg | ORAL_TABLET | Freq: Four times a day (QID) | ORAL | Status: DC | PRN

## 2017-12-06 MED ORDER — HYDROMORPHONE HCL 1 MG/ML IJ SOLN
0.2500 mg | INTRAMUSCULAR | Status: DC | PRN
  Administered 2017-12-06 (×4): 0.5 mg via INTRAVENOUS

## 2017-12-06 MED ORDER — OXYCODONE HCL 5 MG PO TABS: 10.0000 mg | ORAL_TABLET | ORAL | Status: DC | PRN

## 2017-12-06 MED ORDER — SODIUM CHLORIDE 0.9 % IV SOLN
INTRAVENOUS | Status: DC | PRN
  Administered 2017-12-06: 500 mL

## 2017-12-06 MED ORDER — ROCURONIUM BROMIDE 10 MG/ML (PF) SYRINGE
PREFILLED_SYRINGE | INTRAVENOUS | Status: AC
  Filled 2017-12-06: qty 20

## 2017-12-06 MED ORDER — MIDAZOLAM HCL 2 MG/2ML IJ SOLN
INTRAMUSCULAR | Status: AC
  Filled 2017-12-06: qty 2

## 2017-12-06 MED ORDER — BUPIVACAINE-EPINEPHRINE (PF) 0.5% -1:200000 IJ SOLN
INTRAMUSCULAR | Status: DC | PRN
  Administered 2017-12-06: 30 mL via PERINEURAL

## 2017-12-06 MED ORDER — LIDOCAINE 2% (20 MG/ML) 5 ML SYRINGE
INTRAMUSCULAR | Status: DC | PRN
  Administered 2017-12-06: 100 mg via INTRAVENOUS

## 2017-12-06 MED ORDER — PHENYLEPHRINE 40 MCG/ML (10ML) SYRINGE FOR IV PUSH (FOR BLOOD PRESSURE SUPPORT)
PREFILLED_SYRINGE | INTRAVENOUS | Status: AC
  Filled 2017-12-06: qty 10

## 2017-12-06 MED ORDER — DEXAMETHASONE SODIUM PHOSPHATE 10 MG/ML IJ SOLN
INTRAMUSCULAR | Status: AC
  Filled 2017-12-06: qty 1

## 2017-12-06 MED ORDER — DIPHENHYDRAMINE HCL 25 MG PO TABS
50.0000 mg | ORAL_TABLET | Freq: Every evening | ORAL | Status: DC | PRN
  Filled 2017-12-06: qty 4

## 2017-12-06 MED ORDER — CEFAZOLIN SODIUM-DEXTROSE 2-4 GM/100ML-% IV SOLN
2.0000 g | INTRAVENOUS | Status: AC
  Administered 2017-12-06: 2 g via INTRAVENOUS
  Filled 2017-12-06: qty 100

## 2017-12-06 MED ORDER — METOPROLOL SUCCINATE ER 50 MG PO TB24: 50.0000 mg | ORAL_TABLET | Freq: Every day | ORAL | Status: DC

## 2017-12-06 MED ORDER — SODIUM CHLORIDE 0.9 % IV SOLN
INTRAVENOUS | Status: AC
  Filled 2017-12-06: qty 500000

## 2017-12-06 MED ORDER — MIDAZOLAM HCL 2 MG/2ML IJ SOLN
1.0000 mg | INTRAMUSCULAR | Status: AC
  Administered 2017-12-06: 2 mg via INTRAVENOUS
  Filled 2017-12-06: qty 2

## 2017-12-06 MED ORDER — BACITRACIN-NEOMYCIN-POLYMYXIN 400-5-5000 EX OINT
TOPICAL_OINTMENT | CUTANEOUS | Status: AC
  Filled 2017-12-06: qty 1

## 2017-12-06 MED ORDER — PANTOPRAZOLE SODIUM 40 MG PO TBEC
40.0000 mg | DELAYED_RELEASE_TABLET | Freq: Every day | ORAL | Status: DC
  Administered 2017-12-06 – 2017-12-07 (×2): 40 mg via ORAL
  Filled 2017-12-06 (×2): qty 1

## 2017-12-06 MED ORDER — PHENYLEPHRINE HCL 10 MG/ML IJ SOLN
INTRAVENOUS | Status: DC | PRN
  Administered 2017-12-06: 25 ug/min via INTRAVENOUS

## 2017-12-06 MED ORDER — FENTANYL CITRATE (PF) 250 MCG/5ML IJ SOLN
INTRAMUSCULAR | Status: AC
  Filled 2017-12-06: qty 5

## 2017-12-06 MED ORDER — AMLODIPINE BESYLATE 10 MG PO TABS
10.0000 mg | ORAL_TABLET | Freq: Every day | ORAL | Status: DC
  Administered 2017-12-07: 10 mg via ORAL
  Filled 2017-12-06: qty 1

## 2017-12-06 MED ORDER — SUGAMMADEX SODIUM 200 MG/2ML IV SOLN
INTRAVENOUS | Status: DC | PRN
  Administered 2017-12-06: 200 mg via INTRAVENOUS

## 2017-12-06 MED ORDER — ACETAMINOPHEN 325 MG PO TABS
325.0000 mg | ORAL_TABLET | Freq: Four times a day (QID) | ORAL | Status: DC | PRN
  Administered 2017-12-07: 650 mg via ORAL
  Filled 2017-12-06: qty 2

## 2017-12-06 MED ORDER — MIDAZOLAM HCL 2 MG/2ML IJ SOLN: 0.5000 mg | Freq: Once | INTRAMUSCULAR | Status: DC | PRN

## 2017-12-06 MED ORDER — SUGAMMADEX SODIUM 200 MG/2ML IV SOLN
INTRAVENOUS | Status: AC
  Filled 2017-12-06: qty 2

## 2017-12-06 MED ORDER — MEPERIDINE HCL 50 MG/ML IJ SOLN: 6.2500 mg | INTRAMUSCULAR | Status: DC | PRN

## 2017-12-06 MED ORDER — DOCUSATE SODIUM 100 MG PO CAPS
100.0000 mg | ORAL_CAPSULE | Freq: Two times a day (BID) | ORAL | Status: DC
  Administered 2017-12-06 – 2017-12-07 (×2): 100 mg via ORAL
  Filled 2017-12-06 (×2): qty 1

## 2017-12-06 MED ORDER — NAPHAZOLINE-PHENIRAMINE 0.025-0.3 % OP SOLN
1.0000 [drp] | Freq: Two times a day (BID) | OPHTHALMIC | Status: DC | PRN
  Filled 2017-12-06: qty 15

## 2017-12-06 MED ORDER — LOSARTAN POTASSIUM 50 MG PO TABS
100.0000 mg | ORAL_TABLET | Freq: Every day | ORAL | Status: DC
  Administered 2017-12-07: 100 mg via ORAL
  Filled 2017-12-06: qty 2

## 2017-12-06 MED ORDER — ONDANSETRON HCL 4 MG/2ML IJ SOLN
INTRAMUSCULAR | Status: AC
  Filled 2017-12-06: qty 4

## 2017-12-06 MED ORDER — MIDAZOLAM HCL 5 MG/5ML IJ SOLN
INTRAMUSCULAR | Status: DC | PRN
  Administered 2017-12-06: 2 mg via INTRAVENOUS

## 2017-12-06 MED ORDER — ONDANSETRON HCL 4 MG/2ML IJ SOLN
INTRAMUSCULAR | Status: DC | PRN
  Administered 2017-12-06: 4 mg via INTRAVENOUS

## 2017-12-06 SURGICAL SUPPLY — 42 items
BANDAGE ESMARK 6X9 LF (GAUZE/BANDAGES/DRESSINGS) ×1 IMPLANT
BENZOIN TINCTURE PRP APPL 2/3 (GAUZE/BANDAGES/DRESSINGS) IMPLANT
BNDG ESMARK 6X9 LF (GAUZE/BANDAGES/DRESSINGS) ×2
COVER SURGICAL LIGHT HANDLE (MISCELLANEOUS) ×2 IMPLANT
CUFF TOURN SGL QUICK 34 (TOURNIQUET CUFF) ×1
CUFF TRNQT CYL 34X4X40X1 (TOURNIQUET CUFF) ×1 IMPLANT
DRAPE POUCH INSTRU U-SHP 10X18 (DRAPES) ×2 IMPLANT
DRAPE U-SHAPE 47X51 STRL (DRAPES) ×2 IMPLANT
DRSG ADAPTIC 3X8 NADH LF (GAUZE/BANDAGES/DRESSINGS) ×2 IMPLANT
DRSG PAD ABDOMINAL 8X10 ST (GAUZE/BANDAGES/DRESSINGS) ×2 IMPLANT
DURAPREP 26ML APPLICATOR (WOUND CARE) ×4 IMPLANT
ELECT REM PT RETURN 15FT ADLT (MISCELLANEOUS) ×2 IMPLANT
GAUZE SPONGE 4X4 12PLY STRL (GAUZE/BANDAGES/DRESSINGS) IMPLANT
GLOVE BIOGEL PI IND STRL 8.5 (GLOVE) ×2 IMPLANT
GLOVE BIOGEL PI INDICATOR 8.5 (GLOVE) ×2
GLOVE ECLIPSE 8.0 STRL XLNG CF (GLOVE) ×2 IMPLANT
GLOVE SURG SS PI 6.5 STRL IVOR (GLOVE) ×4 IMPLANT
GOWN STRL REUS W/TWL LRG LVL3 (GOWN DISPOSABLE) ×4 IMPLANT
GOWN STRL REUS W/TWL XL LVL3 (GOWN DISPOSABLE) ×2 IMPLANT
MANIFOLD NEPTUNE II (INSTRUMENTS) ×2 IMPLANT
NEEDLE HYPO 22GX1.5 SAFETY (NEEDLE) IMPLANT
PACK ORTHO EXTREMITY (CUSTOM PROCEDURE TRAY) ×2 IMPLANT
PAD CAST 4YDX4 CTTN HI CHSV (CAST SUPPLIES) ×1 IMPLANT
PADDING CAST COTTON 4X4 STRL (CAST SUPPLIES) ×1
PADDING CAST COTTON 6X4 STRL (CAST SUPPLIES) ×2 IMPLANT
POSITIONER SURGICAL ARM (MISCELLANEOUS) ×2 IMPLANT
SCOTCHCAST PLUS 4X4 WHITE (CAST SUPPLIES) ×6 IMPLANT
STAPLER VISISTAT (STAPLE) IMPLANT
STRIP CLOSURE SKIN 1/2X4 (GAUZE/BANDAGES/DRESSINGS) IMPLANT
SUCTION FRAZIER HANDLE 12FR (TUBING) ×1
SUCTION TUBE FRAZIER 12FR DISP (TUBING) ×1 IMPLANT
SUT ETHIBOND NAB CT1 #1 30IN (SUTURE) ×6 IMPLANT
SUT ETHILON 3 0 PS 1 (SUTURE) ×6 IMPLANT
SUT VIC AB 1 CT1 27 (SUTURE) ×1
SUT VIC AB 1 CT1 27XBRD ANTBC (SUTURE) ×1 IMPLANT
SUT VIC AB 2-0 CT1 27 (SUTURE) ×1
SUT VIC AB 2-0 CT1 TAPERPNT 27 (SUTURE) ×1 IMPLANT
SYR CONTROL 10ML LL (SYRINGE) ×2 IMPLANT
TISSUE DERMASPAND 5X5 STRL (Orthopedic Graft) ×2 IMPLANT
TOWEL OR 17X26 10 PK STRL BLUE (TOWEL DISPOSABLE) ×2 IMPLANT
TOWEL OR NON WOVEN STRL DISP B (DISPOSABLE) ×2 IMPLANT
YANKAUER SUCT BULB TIP 10FT TU (MISCELLANEOUS) ×2 IMPLANT

## 2017-12-06 NOTE — Interval H&P Note (Signed)
History and Physical Interval Note:  12/06/2017 1:15 PM  David Chang  has presented today for surgery, with the diagnosis of left achilles tendon rupture; medial gastroc tear  The various methods of treatment have been discussed with the patient and family. After consideration of risks, benefits and other options for treatment, the patient has consented to  Procedure(s): Left Achilles tendon repair; medial head gastroc repair (Left) as a surgical intervention .  The patient's history has been reviewed, patient examined, no change in status, stable for surgery.  I have reviewed the patient's chart and labs.  Questions were answered to the patient's satisfaction.     Latanya Maudlin

## 2017-12-06 NOTE — Progress Notes (Signed)
AssistedDr. Annye Asa with left, ultrasound guided, popliteal block. Side rails up, monitors on throughout procedure. See vital signs in flow sheet. Tolerated Procedure well.

## 2017-12-06 NOTE — Anesthesia Preprocedure Evaluation (Addendum)
Anesthesia Evaluation  Patient identified by MRN, date of birth, ID band Patient awake    Reviewed: Allergy & Precautions, NPO status , Patient's Chart, lab work & pertinent test results  History of Anesthesia Complications Negative for: history of anesthetic complications  Airway Mallampati: I  TM Distance: >3 FB Neck ROM: Full    Dental  (+) Dental Advisory Given   Pulmonary neg pulmonary ROS,    breath sounds clear to auscultation       Cardiovascular hypertension, Pt. on medications and Pt. on home beta blockers (-) angina+ CAD, + Past MI (NSTEMI) and + Cardiac Stents (2016 DES x3)   Rhythm:Regular Rate:Normal  '16 ECHO: moderate LVH. Systolic function mildly reduced. EF 45% to 50%. grade 1 diastolic dysfunction - Aortic valve: There was mild regurgitation. - Mitral valve: There was mild regurgitation.   Neuro/Psych negative neurological ROS     GI/Hepatic negative GI ROS, Neg liver ROS,   Endo/Other  negative endocrine ROS  Renal/GU Renal InsufficiencyRenal disease (creat 1.64)     Musculoskeletal   Abdominal   Peds  Hematology negative hematology ROS (+)   Anesthesia Other Findings   Reproductive/Obstetrics                            Anesthesia Physical Anesthesia Plan  ASA: III  Anesthesia Plan: General   Post-op Pain Management: GA combined w/ Regional for post-op pain   Induction: Intravenous  PONV Risk Score and Plan: 2 and Ondansetron and Dexamethasone  Airway Management Planned: Oral ETT  Additional Equipment:   Intra-op Plan:   Post-operative Plan: Extubation in OR  Informed Consent: I have reviewed the patients History and Physical, chart, labs and discussed the procedure including the risks, benefits and alternatives for the proposed anesthesia with the patient or authorized representative who has indicated his/her understanding and acceptance.   Dental  advisory given  Plan Discussed with: CRNA and Surgeon  Anesthesia Plan Comments: (Plan routine monitors, GETA with popliteal block for post op analgesia )        Anesthesia Quick Evaluation

## 2017-12-06 NOTE — H&P (Signed)
David Chang is an 57 y.o. male.   Chief Complaint: Pain in his Left ankle. HPI: Injured his Left Achilles Tendon secondary to an Injury.  Past Medical History:  Diagnosis Date  . Arthritis   . Coronary artery disease   . Hyperlipidemia LDL goal <70 04/10/2015  . Hypertension   . Ischemic cardiomyopathy 04/10/2015  . S/P angioplasty with stent to mLAD DES, pRCA DES, mLCX DES 04/08/15  04/10/2015    Past Surgical History:  Procedure Laterality Date  . achillies repair     and medial gastrocnemius tear repair  . CARDIAC CATHETERIZATION N/A 04/08/2015   Procedure: Left Heart Cath and Coronary Angiography;  Surgeon: Leonie Man, MD;  Location: [facility];  Service: Cardiovascular;  Laterality: N/A;  . KNEE SURGERY Left   . left tricep surgery      Right tricep repair also  . TOTAL KNEE ARTHROPLASTY Left 04/24/2016   Procedure: LEFT TOTAL KNEE ARTHROPLASTY;  Surgeon: Gaynelle Arabian, MD;  Location: [facility];  Service: Orthopedics;  Laterality: Left;    Family History  Problem Relation Age of Onset  . CVA Other   . Cancer Father 71       LUNG  . Heart attack Paternal Grandfather 73  . Hypertension Sister    Social History:  reports that he has never smoked. He has never used smokeless tobacco. He reports that he drinks alcohol. He reports that he does not use drugs.  Allergies: No Known Allergies  Medications Prior to Admission  Medication Sig Dispense Refill  . amLODipine (NORVASC) 10 MG tablet TAKE 1 TABLET BY MOUTH ONCE DAILY 90 tablet 2  . aspirin EC 81 MG tablet Take 81 mg by mouth daily.    . clopidogrel (PLAVIX) 75 MG tablet TAKE ONE TABLET BY MOUTH DAILY 90 tablet 1  . ibuprofen (ADVIL,MOTRIN) 200 MG tablet Take 800 mg by mouth daily as needed for headache or moderate pain.    Marland Kitchen losartan (COZAAR) 100 MG tablet Take 100 mg by mouth daily.  1  . metoprolol succinate (TOPROL-XL) 100 MG 24 hr tablet Take 100 mg by mouth daily. Take with or immediately following  a meal.    . milk thistle 175 MG tablet Take 175 mg by mouth daily.    . naphazoline-pheniramine (NAPHCON-A) 0.025-0.3 % ophthalmic solution Place 1 drop into both eyes 2 (two) times daily as needed for eye irritation.    . Omega-3 Fatty Acids (FISH OIL) 1000 MG CAPS Take 1,000 mg by mouth daily.    Marland Kitchen acetaminophen (TYLENOL) 500 MG tablet Take 500 mg by mouth daily as needed for moderate pain or headache.     . diphenhydrAMINE (BENADRYL) 50 MG tablet Take 50-100 mg by mouth at bedtime as needed for sleep.    . metoprolol succinate (TOPROL-XL) 50 MG 24 hr tablet TAKE 1 TABLET BY MOUTH ONCE DAILY. TAKE  WITH  OR  IMMEDIATELY  FOLLOWING  A  MEAL 30 tablet 0  . nitroGLYCERIN (NITROSTAT) 0.4 MG SL tablet Place 1 tablet (0.4 mg total) under the tongue every 5 (five) minutes as needed for chest pain. 25 tablet 4  . omeprazole (PRILOSEC) 20 MG capsule Take 20 mg by mouth daily as needed (acid reflux).      Results for orders placed or performed during the hospital encounter of 12/05/17 (from the past 48 hour(s))  APTT     Status: None   Collection Time: 12/05/17 11:46 AM  Result Value Ref Range  aPTT 30 24 - 36 seconds    Comment: Performed at [facility], [place] [address] [place], [place] [address]  CBC WITH DIFFERENTIAL     Status: Abnormal   Collection Time: 12/05/17 11:46 AM  Result Value Ref Range   WBC 9.4 4.0 - 10.5 K/uL   RBC 5.37 4.22 - 5.81 MIL/uL   Hemoglobin 17.3 (H) 13.0 - 17.0 g/dL   HCT 50.8 39.0 - 52.0 %   MCV 94.6 78.0 - 100.0 fL   MCH 32.2 26.0 - 34.0 pg   MCHC 34.1 30.0 - 36.0 g/dL   RDW 13.5 11.5 - 15.5 %   Platelets 208 150 - 400 K/uL   Neutrophils Relative % 78 %   Neutro Abs 7.3 1.7 - 7.7 K/uL   Lymphocytes Relative 10 %   Lymphs Abs 0.9 0.7 - 4.0 K/uL   Monocytes Relative 9 %   Monocytes Absolute 0.8 0.1 - 1.0 K/uL   Eosinophils Relative 3 %   Eosinophils Absolute 0.3 0.0 - 0.7 K/uL   Basophils Relative 0 %   Basophils Absolute 0.0 0.0 - 0.1  K/uL    Comment: Performed at [facility], [place] [address], [place], [place] [address]  Comprehensive metabolic panel     Status: Abnormal   Collection Time: 12/05/17 11:46 AM  Result Value Ref Range   Sodium 137 135 - 145 mmol/L   Potassium 4.5 3.5 - 5.1 mmol/L   Chloride 103 98 - 111 mmol/L    Comment: Please note change in reference range.   CO2 26 22 - 32 mmol/L   Glucose, Bld 100 (H) 70 - 99 mg/dL    Comment: Please note change in reference range.   BUN 26 (H) 6 - 20 mg/dL    Comment: Please note change in reference range.   Creatinine, Ser 1.64 (H) 0.61 - 1.24 mg/dL   Calcium 8.9 8.9 - 10.3 mg/dL   Total Protein 6.6 6.5 - 8.1 g/dL   Albumin 3.5 3.5 - 5.0 g/dL   AST 37 15 - 41 U/L   ALT 37 0 - 44 U/L    Comment: Please note change in reference range.   Alkaline Phosphatase 39 38 - 126 U/L   Total Bilirubin 0.8 0.3 - 1.2 mg/dL   GFR calc non Af Amer 44 (L) >60 mL/min   GFR calc Af Amer 51 (L) >60 mL/min    Comment: (NOTE) The eGFR has been calculated using the CKD EPI equation. This calculation has not been validated in all clinical situations. eGFR's persistently <60 mL/min signify possible Chronic Kidney Disease.    Anion gap 8 5 - 15    Comment: Performed at [facility], [place] [address] [place], [place] [address]  Protime-INR     Status: None   Collection Time: 12/05/17 11:46 AM  Result Value Ref Range   Prothrombin Time 12.2 11.4 - 15.2 seconds   INR 0.92     Comment: Performed at [facility], [place] [address] [place], [place] [address]  Type and screen Order type and screen if day of surgery is less than 15 days from draw of preadmission visit or order morning of surgery if day of surgery is greater than 6 days from preadmission visit.     Status: None   Collection Time: 12/05/17 11:46 AM  Result Value Ref Range   ABO/RH(D) A POS    Antibody Screen NEG    Sample Expiration 12/09/2017  Extend sample  reason      NO TRANSFUSIONS OR PREGNANCY IN THE PAST 3 MONTHS Performed at [place] [address] [place], [place] [address]    No results found.  Review of Systems  Constitutional: Negative.   HENT: Negative.   Eyes: Negative.   Respiratory: Negative.   Cardiovascular: Negative.   Gastrointestinal: Negative.   Genitourinary: Negative.   Musculoskeletal: Positive for joint pain.  Skin: Negative.   Neurological: Negative.   Endo/Heme/Allergies: Negative.   Psychiatric/Behavioral: Negative.     Blood pressure (!) 146/98, resp. rate 18, height 6' (1.829 m), weight 102.1 kg (225 lb), SpO2 96 %. Physical Exam  Constitutional: He appears well-developed.  HENT:  Head: Normocephalic.  Eyes: Pupils are equal, round, and reactive to light.  Neck: Normal range of motion.  Cardiovascular: Normal rate.  Respiratory: Effort normal.  GI: Soft.  Musculoskeletal:  Complete tear of his Left Achiles Tendon.  Neurological: He is alert.  Skin: Skin is warm.  Psychiatric: He has a normal mood and affect.     Assessment/Plan Open Repair of his Left Achilles Tendon and Dermaspan Graft. And short leg Cast.  Latanya Maudlin, MD 12/06/2017, 1:10 PM

## 2017-12-06 NOTE — Op Note (Signed)
NAME: David Chang, David Chang MEDICAL RECORD OA:[id number] ACCOUNT [id number] DATE OF BIRTH:[birth year] FACILITY: [facility] LOCATION: [facility] PHYSICIAN:Miro Balderson Fransico Setters, MD  OPERATIVE REPORT  DATE OF PROCEDURE:  12/06/2017  SURGEON:  Latanya Maudlin, MD  ASSISTANT:  Ardeen Jourdain, PA.  PREOPERATIVE DIAGNOSES:  1.  Complete tear of the left Achilles tendon. 2.  Partial tear of the medial head of the gastrocnemius muscle on the left.  POSTOPERATIVE DIAGNOSES: 1.  Complete tear of the left Achilles tendon. 2.  Partial tear of the medial head of the gastrocnemius muscle on the left.     OPERATION:   1.  Open primary repair of the left Achilles tendon. 2.  Application of a DermaSpan graft to the left Achilles tendon. 3.  Application of a short leg cast with the foot in acute plantar flexion to reduce the tension on the repair site.  DESCRIPTION OF PROCEDURE:  Under general anesthesia, the patient was placed in the prone position.  Prior to that in the holding area, the anesthesiologist gave him a popliteal nerve block.  At this time, after sterile prep and draping and the  appropriate time-out and after I marked the left leg in the holding area, the tourniquet was elevated at 325 mmHg.  That was done after sterile prep and draping.  At this time, a posterior incision was made over the Achilles tendon.  Great care was taken  to isolate and not disturb the sensory nerve.  We went down and identified the complete tear of the Achilles tendon.  We thoroughly irrigated out the hematoma and did a primary repair utilizing #1 Ethibond suture.  Following that, the tunnel was created  out of the tendon and we brought a dermis band graft up under the tendon and around and applied it like a tubular graft.  We then utilized a longitudinal graft over that site as well.  The tendon was nicely repaired.  The foot was kept in plantarflexion  during the repair to keep tension off the repair site.  After this was  done, the skin was closed with 3-0 nylon suture.  Sterile Neosporin dressing was applied with an ABD pad.  Following that, sterile cast padding was applied in the usual fashion.  We  applied a short leg fiberglass cast with the foot acutely plantarflex.  He was given 2 grams of IV Ancef prior to the surgery.  The patient left the operating room in satisfactory condition.  TN/NUANCE  D:12/06/2017 T:12/06/2017 JOB:[id number]

## 2017-12-06 NOTE — Anesthesia Procedure Notes (Signed)
Anesthesia Regional Block: Popliteal block   Pre-Anesthetic Checklist: ,, timeout performed, Correct Patient, Correct Site, Correct Laterality, Correct Procedure, Correct Position, site marked, Risks and benefits discussed,  Surgical consent,  Pre-op evaluation,  At surgeon's request and post-op pain management  Laterality: Left and Lower  Prep: chloraprep       Needles:  Injection technique: Single-shot  Needle Type: Echogenic Stimulator Needle     Needle Length: 9cm  Needle Gauge: 21     Additional Needles:   Procedures:, nerve stimulator,,,,,,,   Nerve Stimulator or Paresthesia:  Response: toe dorsiflexion, 0.38 mA, 0.1 ms,   Additional Responses:   Narrative:  Start time: 12/06/2017 1:17 PM End time: 12/06/2017 1:31 PM Injection made incrementally with aspirations every 5 mL.  Performed by: Personally  Anesthesiologist: Annye Asa, MD  Additional Notes: Pt identified in Holding room.  Monitors applied. Working IV access confirmed. Sterile prep, L lateral thigh.  #21ga ECHOgenic PNS needle, unable to ID popliteal nerve well with [place], but toe twitch 0.79mA .  30cc 0.5% Bupivacaine with 1:200k epi injected incrementally after negative test dose.  Patient asymptomatic, VSS, no heme aspirated, tolerated well.  Jenita Seashore, MD

## 2017-12-06 NOTE — Brief Op Note (Signed)
12/06/2017  3:07 PM  PATIENT:  David Chang  57 y.o. male  PRE-OPERATIVE DIAGNOSIS:  left achilles tendon rupture; medial gastroc tear  POST-OPERATIVE DIAGNOSIS: L eft achilles tendon rupture; medial gastroc tear  PROCEDURE:  Procedure(s): Complete Left Achilles tendon repair; dermaspan graf, application of cast (Left)  SURGEON:  Surgeon(s) and Role:    Latanya Maudlin, MD - Primary  PHYSICIAN ASSISTANT: Ardeen Jourdain PA  ASSISTANTS:Amber [place] PA  ANESTHESIA:   general,Popliteal Block  EBL:  0  BLOOD ADMINISTERED:none  DRAINS: none   LOCAL MEDICATIONS USED:  NONE  SPECIMEN:  No Specimen  DISPOSITION OF SPECIMEN:  N/A  COUNTS:  YES  TOURNIQUET:  * Missing tourniquet times found for documented tourniquets in log: [id number] *  DICTATION: .Other Dictation: Dictation Number [id number]  PLAN OF CARE: Admit for overnight observation  PATIENT DISPOSITION:  PACU - hemodynamically stable.   Delay start of Pharmacological VTE agent (>24hrs) due to surgical blood loss or risk of bleeding: yes

## 2017-12-06 NOTE — Transfer of Care (Signed)
Immediate Anesthesia Transfer of Care Note  Patient: David Chang  Procedure(s) Performed: Complete Left Achilles tendon repair; dermaspan graf, application of cast (Left )  Patient Location: PACU  Anesthesia Type:General  Level of Consciousness: awake, alert  and oriented  Airway & Oxygen Therapy: Patient Spontanous Breathing and Patient connected to face mask oxygen  Post-op Assessment: Report given to RN and Post -op Vital signs reviewed and stable  Post vital signs: Reviewed and stable  Last Vitals:  Vitals Value Taken Time  BP 136/97 12/06/2017  3:19 PM  Temp 36.6 C 12/06/2017  3:19 PM  Pulse 78 12/06/2017  3:23 PM  Resp 16 12/06/2017  3:23 PM  SpO2 100 % 12/06/2017  3:23 PM  Vitals shown include unvalidated device data.  Last Pain:  Vitals:   12/06/17 1519  TempSrc:   PainSc: (P) 0-No pain      Patients Stated Pain Goal: 5 (48/35/07 5732)  Complications: No apparent anesthesia complications

## 2017-12-06 NOTE — Anesthesia Postprocedure Evaluation (Signed)
Anesthesia Post Note  Patient: David Chang  Procedure(s) Performed: Complete Left Achilles tendon repair; dermaspan graf, application of cast (Left )     Patient location during evaluation: PACU Anesthesia Type: General and Regional Level of consciousness: awake and alert, patient cooperative and oriented Pain management: pain level controlled Vital Signs Assessment: post-procedure vital signs reviewed and stable Respiratory status: spontaneous breathing, nonlabored ventilation and respiratory function stable Cardiovascular status: blood pressure returned to baseline and stable Postop Assessment: adequate PO intake Anesthetic complications: no    Last Vitals:  Vitals:   12/06/17 1519 12/06/17 1530  BP: (!) 136/97 137/78  Pulse: 78 78  Resp: 16 15  Temp: 36.6 C   SpO2: 99% 97%    Last Pain:  Vitals:   12/06/17 1519  TempSrc:   PainSc: 0-No pain                 Katori Wirsing,E. Berneice Zettlemoyer

## 2017-12-06 NOTE — Anesthesia Procedure Notes (Signed)
Procedure Name: Intubation Date/Time: 12/06/2017 1:50 PM Performed by: Montel Clock, CRNA Pre-anesthesia Checklist: Patient identified, Emergency Drugs available, Suction available, Patient being monitored and Timeout performed Patient Re-evaluated:Patient Re-evaluated prior to induction Oxygen Delivery Method: Circle system utilized Preoxygenation: Pre-oxygenation with 100% oxygen Induction Type: IV induction Ventilation: Mask ventilation without difficulty, Oral airway inserted - appropriate to patient size and Two handed mask ventilation required Laryngoscope Size: Mac and 3 Grade View: Grade II Tube type: Oral Tube size: 7.5 mm Number of attempts: 1 Airway Equipment and Method: Stylet Placement Confirmation: ETT inserted through vocal cords under direct vision,  positive ETCO2 and breath sounds checked- equal and bilateral Secured at: 24 cm Tube secured with: Tape Dental Injury: Teeth and Oropharynx as per pre-operative assessment  Comments: Required 2 hand mask for seal, easy mask after relaxed. ETT easily passed.

## 2017-12-06 NOTE — Interval H&P Note (Signed)
History and Physical Interval Note:  12/06/2017 1:14 PM  David Chang  has presented today for surgery, with the diagnosis of left achilles tendon rupture; medial gastroc tear  The various methods of treatment have been discussed with the patient and family. After consideration of risks, benefits and other options for treatment, the patient has consented to  Procedure(s): Left Achilles tendon repair; medial head gastroc repair (Left) as a surgical intervention .  The patient's history has been reviewed, patient examined, no change in status, stable for surgery.  I have reviewed the patient's chart and labs.  Questions were answered to the patient's satisfaction.     Latanya Maudlin

## 2017-12-07 ENCOUNTER — Encounter (HOSPITAL_COMMUNITY): Payer: Self-pay | Admitting: Orthopedic Surgery

## 2017-12-07 DIAGNOSIS — E785 Hyperlipidemia, unspecified: Secondary | ICD-10-CM | POA: Diagnosis not present

## 2017-12-07 DIAGNOSIS — S86822A Laceration of other muscle(s) and tendon(s) at lower leg level, left leg, initial encounter: Secondary | ICD-10-CM | POA: Diagnosis not present

## 2017-12-07 DIAGNOSIS — Z8249 Family history of ischemic heart disease and other diseases of the circulatory system: Secondary | ICD-10-CM | POA: Diagnosis not present

## 2017-12-07 DIAGNOSIS — Z955 Presence of coronary angioplasty implant and graft: Secondary | ICD-10-CM | POA: Diagnosis not present

## 2017-12-07 DIAGNOSIS — Z7902 Long term (current) use of antithrombotics/antiplatelets: Secondary | ICD-10-CM | POA: Diagnosis not present

## 2017-12-07 DIAGNOSIS — I252 Old myocardial infarction: Secondary | ICD-10-CM | POA: Diagnosis not present

## 2017-12-07 DIAGNOSIS — X58XXXA Exposure to other specified factors, initial encounter: Secondary | ICD-10-CM | POA: Diagnosis not present

## 2017-12-07 DIAGNOSIS — M1990 Unspecified osteoarthritis, unspecified site: Secondary | ICD-10-CM | POA: Diagnosis not present

## 2017-12-07 DIAGNOSIS — S86012A Strain of left Achilles tendon, initial encounter: Secondary | ICD-10-CM | POA: Diagnosis not present

## 2017-12-07 DIAGNOSIS — I080 Rheumatic disorders of both mitral and aortic valves: Secondary | ICD-10-CM | POA: Diagnosis not present

## 2017-12-07 DIAGNOSIS — I2510 Atherosclerotic heart disease of native coronary artery without angina pectoris: Secondary | ICD-10-CM | POA: Diagnosis not present

## 2017-12-07 DIAGNOSIS — Z7982 Long term (current) use of aspirin: Secondary | ICD-10-CM | POA: Diagnosis not present

## 2017-12-07 DIAGNOSIS — Z96652 Presence of left artificial knee joint: Secondary | ICD-10-CM | POA: Diagnosis not present

## 2017-12-07 DIAGNOSIS — I255 Ischemic cardiomyopathy: Secondary | ICD-10-CM | POA: Diagnosis not present

## 2017-12-07 DIAGNOSIS — I10 Essential (primary) hypertension: Secondary | ICD-10-CM | POA: Diagnosis not present

## 2017-12-07 DIAGNOSIS — Z79899 Other long term (current) drug therapy: Secondary | ICD-10-CM | POA: Diagnosis not present

## 2017-12-07 MED ORDER — OXYCODONE HCL 5 MG PO TABS: 5.0000 mg | ORAL_TABLET | Freq: Four times a day (QID) | ORAL | 0 refills | Status: DC | PRN

## 2017-12-07 NOTE — Progress Notes (Signed)
Patient given crutches, prescription and discharge paper work. Patients questions regarding medications were answered.  Patient has no further questions or concerns.

## 2017-12-07 NOTE — Evaluation (Signed)
Physical Therapy One Time Evaluation Patient Details Name: David Chang MRN: [id number] DOB: 26-Dec-1960 Today's Date: 12/07/2017   History of Present Illness  Pt is a 57 year old male s/p Complete Left Achilles tendon repair, dermaspan graf, and application of cast   Clinical Impression  Patient evaluated by Physical Therapy with no further acute PT needs identified. All education has been completed and the patient has no further questions.  Pt reports using crutches in the past so reeducated on sizing and safe use of crutches.  Pt ambulated short distance and practiced steps.  Pt acknowledges importance of TDWB to NWB for healing.  Pt feels comfortable with mobilizing with crutches and now eager to d/c home today. See below for any follow-up Physical Therapy or equipment needs. PT is signing off. Thank you for this referral.     Follow Up Recommendations No PT follow up(f/u upon next MD visit, pt currently casted)    Equipment Recommendations  Other (comment);Crutches(called ortho tech for crutches)    Recommendations for Other Services       Precautions / Restrictions Restrictions Weight Bearing Restrictions: Yes LLE Weight Bearing: Touchdown weight bearing      Mobility  Bed Mobility Overal bed mobility: Modified Independent                Transfers Overall transfer level: Needs assistance Equipment used: Crutches Transfers: Sit to/from Stand Sit to Stand: Supervision         General transfer comment: educated pt on sit to stands with crutches, maintains TDWB  Ambulation/Gait Ambulation/Gait assistance: Supervision;Min guard Gait Distance (Feet): 80 Feet Assistive device: Crutches       General Gait Details: swing through pattern, occasional L TDWB for balance, pt reports understanding of crutch use  Stairs Stairs: Yes Stairs assistance: Min guard Stair Management: Step to pattern;Forwards;With crutches;One rail Left Number of Stairs: 2 General  stair comments: pt educated on safe performance of steps with rail and crutch, reports son can assist him at home if needed, reports understanding  Wheelchair Mobility    Modified Rankin (Stroke Patients Only)       Balance                                             Pertinent Vitals/Pain Pain Assessment: 0-10 Pain Score: 2  Pain Location: L ankle Pain Descriptors / Indicators: Sore;Tingling Pain Intervention(s): Limited activity within patient's tolerance;Repositioned;Monitored during session    [place] expects to be discharged to:: Private residence Living Arrangements: Spouse/significant other Available Help at Discharge: Family Type of Home: House Home Access: Stairs to enter   Technical brewer of Steps: 2 Home Layout: Two level Home Equipment: Bedside commode      Prior Function Level of Independence: Independent               Hand Dominance        Extremity/Trunk Assessment   Upper Extremity Assessment Upper Extremity Assessment: Overall WFL for tasks assessed    Lower Extremity Assessment Lower Extremity Assessment: Overall WFL for tasks assessed;LLE deficits/detail LLE Deficits / Details: left lower leg in cast, pt able to feel toes a little, tingling       Communication   Communication: No difficulties  Cognition Arousal/Alertness: Awake/alert Behavior During Therapy: WFL for tasks assessed/performed Overall Cognitive Status: Within Functional Limits for tasks assessed  General Comments      Exercises     Assessment/Plan    PT Assessment Patent does not need any further PT services  PT Problem List         PT Treatment Interventions      PT Goals (Current goals can be found in the Care Plan section)  Acute Rehab PT Goals PT Goal Formulation: All assessment and education complete, DC therapy    Frequency     Barriers to  discharge        Co-evaluation               AM-PAC PT "6 Clicks" Daily Activity  Outcome Measure Difficulty turning over in bed (including adjusting bedclothes, sheets and blankets)?: None Difficulty moving from lying on back to sitting on the side of the bed? : None Difficulty sitting down on and standing up from a chair with arms (e.g., wheelchair, bedside commode, etc,.)?: A Little Help needed moving to and from a bed to chair (including a wheelchair)?: A Little Help needed walking in hospital room?: A Little Help needed climbing 3-5 steps with a railing? : A Little 6 Click Score: 20    End of Session   Activity Tolerance: Patient tolerated treatment well Patient left: in bed;with call bell/phone within reach;with family/visitor present Nurse Communication: Mobility status PT Visit Diagnosis: Difficulty in walking, not elsewhere classified (R26.2)    Time: 0939-1000 PT Time Calculation (min) (ACUTE ONLY): 21 min   Charges:   PT Evaluation $PT Eval Low Complexity: 1 Low     PT G CodesCarmelia Chang, PT, DPT 12/07/2017 Pager: [phone]  David Chang E 12/07/2017, 11:04 AM

## 2017-12-07 NOTE — Progress Notes (Addendum)
   Subjective: 1 Day Post-Op Procedure(s) (LRB): Complete Left Achilles tendon repair; dermaspan graf, application of cast (Left) Patient reports pain as 0 on 0-10 scale.   Patient seen in rounds with Dr. Gladstone Lighter. Patient is well, and has had no acute complaints or problems. Reports that the cast is comfortable, not tight. Reports numbness in the left lower leg and foot still. No SOB or chest pain.  Plan is to go Home after hospital stay.  Objective: Vital signs in last 24 hours: Temp:  [97.6 F (36.4 C)-98.5 F (36.9 C)] 98.3 F (36.8 C) (07/19 0540) Pulse Rate:  [72-89] 78 (07/19 0540) Resp:  [10-19] 16 (07/19 0540) BP: (123-148)/(78-107) 133/84 (07/19 0540) SpO2:  [93 %-99 %] 98 % (07/19 0540) Weight:  [102.1 kg (225 lb)] 102.1 kg (225 lb) (07/18 1219)  Intake/Output from previous day:  Intake/Output Summary (Last 24 hours) at 12/07/2017 0712 Last data filed at 12/07/2017 0600 Gross per 24 hour  Intake 3283.75 ml  Output 1460 ml  Net 1823.75 ml    Intake/Output this shift: No intake/output data recorded.  Labs: Recent Labs    12/05/17 1146  HGB 17.3*   Recent Labs    12/05/17 1146  WBC 9.4  RBC 5.37  HCT 50.8  PLT 208   Recent Labs    12/05/17 1146  NA 137  K 4.5  CL 103  CO2 26  BUN 26*  CREATININE 1.64*  GLUCOSE 100*  CALCIUM 8.9   Recent Labs    12/05/17 1146  INR 0.92    EXAM General - Patient is Alert and Oriented Extremity - Compartment soft  Cap refill 1 sec No sensation to light touch over toes Dressing/Incision - cast in good condition Motor Function - intact, moving foot and toes well on exam.   Past Medical History:  Diagnosis Date  . Arthritis   . Coronary artery disease   . Hyperlipidemia LDL goal <70 04/10/2015  . Hypertension   . Ischemic cardiomyopathy 04/10/2015  . S/P angioplasty with stent to mLAD DES, pRCA DES, mLCX DES 04/08/15  04/10/2015    Assessment/Plan: 1 Day Post-Op Procedure(s) (LRB): Complete Left  Achilles tendon repair; dermaspan graf, application of cast (Left) Active Problems:   Achilles tendon rupture, left, initial encounter  Estimated body mass index is 30.52 kg/m as calculated from the following:   Height as of this encounter: 6' (1.829 m).   Weight as of this encounter: 102.1 kg (225 lb). Advance diet Up with therapy  DVT Prophylaxis - SCDs; will resume Plavix at home Touch down weightbearing for transfers  Will monitor today to make sure the block wears off. Will get up with therapy to work on ADLs and transfers. DC home today. Follow up in office on next Monday.   Ardeen Jourdain, PA-C Orthopaedic Surgery 12/07/2017, 7:12 AM

## 2017-12-07 NOTE — Discharge Instructions (Signed)
Keep cast dry Use crutches when walking Touch down weightbearing on left leg Use cast shoe to protect cast Call if any temperatures greater than 101 or any wound complications: [phone] during the day and ask for Dr. Charlestine Night nurse, Brunilda Payor.

## 2017-12-10 NOTE — Discharge Summary (Signed)
Physician Discharge Summary   Patient ID: David Chang MRN: [id number] DOB/AGE: 59/19/1962 60 y.o.  Admit date: 12/06/2017 Discharge date: 12/07/2017  Primary Diagnosis: Left Achilles Tendon Rupture  Admission Diagnoses:  Past Medical History:  Diagnosis Date  . Arthritis   . Coronary artery disease   . Hyperlipidemia LDL goal <70 04/10/2015  . Hypertension   . Ischemic cardiomyopathy 04/10/2015  . S/P angioplasty with stent to mLAD DES, pRCA DES, mLCX DES 04/08/15  04/10/2015   Discharge Diagnoses:   Active Problems:   Achilles tendon rupture, left, initial encounter  Estimated body mass index is 30.52 kg/m as calculated from the following:   Height as of this encounter: 6' (1.829 m).   Weight as of this encounter: 102.1 kg (225 lb).  Procedure:  Procedure(s) (LRB): Complete Left Achilles tendon repair; dermaspan graf, application of cast (Left)   Consults: None  HPI: The patient presented to the office with the chief complaint of left ankle pain. He reported an injury with a pop and immediate pain. He was evaluated and MRI was performed, showing a torn left Achilles Tendon. His Plavix was discontinued for 5 days prior to plan for surgical intervention.   Laboratory Data: Hospital Outpatient Visit on 12/05/2017  Component Date Value Ref Range Status  . aPTT 12/05/2017 30  24 - 36 seconds Final   Performed at [facility], [place] [address], [place], [place] [address]  . WBC 12/05/2017 9.4  4.0 - 10.5 K/uL Final  . RBC 12/05/2017 5.37  4.22 - 5.81 MIL/uL Final  . Hemoglobin 12/05/2017 17.3* 13.0 - 17.0 g/dL Final  . HCT 12/05/2017 50.8  39.0 - 52.0 % Final  . MCV 12/05/2017 94.6  78.0 - 100.0 fL Final  . MCH 12/05/2017 32.2  26.0 - 34.0 pg Final  . MCHC 12/05/2017 34.1  30.0 - 36.0 g/dL Final  . RDW 12/05/2017 13.5  11.5 - 15.5 % Final  . Platelets 12/05/2017 208  150 - 400 K/uL Final  . Neutrophils Relative % 12/05/2017 78  % Final  . Neutro Abs  12/05/2017 7.3  1.7 - 7.7 K/uL Final  . Lymphocytes Relative 12/05/2017 10  % Final  . Lymphs Abs 12/05/2017 0.9  0.7 - 4.0 K/uL Final  . Monocytes Relative 12/05/2017 9  % Final  . Monocytes Absolute 12/05/2017 0.8  0.1 - 1.0 K/uL Final  . Eosinophils Relative 12/05/2017 3  % Final  . Eosinophils Absolute 12/05/2017 0.3  0.0 - 0.7 K/uL Final  . Basophils Relative 12/05/2017 0  % Final  . Basophils Absolute 12/05/2017 0.0  0.0 - 0.1 K/uL Final   Performed at [facility], [place] [address] [place], [place] [address]  . Sodium 12/05/2017 137  135 - 145 mmol/L Final  . Potassium 12/05/2017 4.5  3.5 - 5.1 mmol/L Final  . Chloride 12/05/2017 103  98 - 111 mmol/L Final   Please note change in reference range.  . CO2 12/05/2017 26  22 - 32 mmol/L Final  . Glucose, Bld 12/05/2017 100* 70 - 99 mg/dL Final   Please note change in reference range.  . BUN 12/05/2017 26* 6 - 20 mg/dL Final   Please note change in reference range.  . Creatinine, Ser 12/05/2017 1.64* 0.61 - 1.24 mg/dL Final  . Calcium 12/05/2017 8.9  8.9 - 10.3 mg/dL Final  . Total Protein 12/05/2017 6.6  6.5 - 8.1 g/dL Final  . Albumin 12/05/2017 3.5  3.5 - 5.0 g/dL Final  .  AST 12/05/2017 37  15 - 41 U/L Final  . ALT 12/05/2017 37  0 - 44 U/L Final   Please note change in reference range.  . Alkaline Phosphatase 12/05/2017 39  38 - 126 U/L Final  . Total Bilirubin 12/05/2017 0.8  0.3 - 1.2 mg/dL Final  . GFR calc non Af Amer 12/05/2017 44* >60 mL/min Final  . GFR calc Af Amer 12/05/2017 51* >60 mL/min Final   Comment: (NOTE) The eGFR has been calculated using the CKD EPI equation. This calculation has not been validated in all clinical situations. eGFR's persistently <60 mL/min signify possible Chronic Kidney Disease.   Georgiann Hahn gap 12/05/2017 8  5 - 15 Final   Performed at [facility], [place] [address] [place], [place] [address]  . Prothrombin Time 12/05/2017 12.2  11.4 - 15.2 seconds  Final  . INR 12/05/2017 0.92   Final   Performed at [facility] [address] [place], [place] [address]  . ABO/RH(D) 12/05/2017 A POS   Final  . Antibody Screen 12/05/2017 NEG   Final  . Sample Expiration 12/05/2017 12/09/2017   Final  . Extend sample reason 12/05/2017    Final                   Value:NO TRANSFUSIONS OR PREGNANCY IN THE PAST 3 MONTHS Performed at [facility], [place] [address], [place], [place] [address]      X-Rays:No results found.  EKG: Orders placed or performed in visit on 12/04/17  . EKG 12-Lead     Hospital Course: David Chang is a 57 y.o. who was admitted to [facility]. They were brought to the operating room on 12/06/2017 and underwent Procedure(s): Complete Left Achilles tendon repair; dermaspan graf, application of cast.  Patient tolerated the procedure well and was later transferred to the recovery room and then to the orthopaedic floor for postoperative care.  They were given PO and IV analgesics for pain control following their surgery.  They were given 24 hours of postoperative antibiotics of  Anti-infectives (From admission, onward)   Start     Dose/Rate Route Frequency Ordered Stop   12/06/17 2000  ceFAZolin (ANCEF) IVPB 1 g/50 mL premix     1 g 100 mL/hr over 30 Minutes Intravenous Every 6 hours 12/06/17 1638 12/07/17 0913   12/06/17 1430  polymyxin B 500,000 Units, bacitracin 50,000 Units in sodium chloride 0.9 % 500 mL irrigation  Status:  Discontinued       As needed 12/06/17 1430 12/06/17 1517   12/06/17 1145  ceFAZolin (ANCEF) IVPB 2g/100 mL premix     2 g 200 mL/hr over 30 Minutes Intravenous On call to O.R. 12/06/17 1144 12/06/17 1400     and started on DVT prophylaxis in the form of Aspirin and Plavix.   PT and OT were ordered.  Discharge planning consulted to help with postop disposition and equipment needs.  Patient had a good night on the evening of surgery.  They started to get up OOB  with therapy on day one. The patient had progressed with therapy and meeting their goals.  Patient was seen in rounds and was ready to go home.   Diet: Cardiac diet Activity:TDWB left LE Follow-up:in 10 days Disposition - Home Discharged Condition: stable   Discharge Instructions    Call MD / Call 911   Complete by:  As directed    If you experience chest pain or  shortness of breath, CALL 911 and be transported to the hospital emergency room.  If you develope a fever above 101 F, pus (white drainage) or increased drainage or redness at the wound, or calf pain, call your surgeon's office.   Constipation Prevention   Complete by:  As directed    Drink plenty of fluids.  Prune juice may be helpful.  You may use a stool softener, such as Colace (over the counter) 100 mg twice a day.  Use MiraLax (over the counter) for constipation as needed.   Diet - low sodium heart healthy   Complete by:  As directed    Discharge instructions   Complete by:  As directed    Keep cast dry Use crutches when walking Touch down weightbearing on left leg Use cast shoe to protect cast Call if any temperatures greater than 101 or any wound complications: [phone] during the day and ask for Dr. Gioffre's nurse, Tammy Johnson.   Increase activity slowly as tolerated   Complete by:  As directed      Allergies as of 12/07/2017   No Known Allergies     Medication List    STOP taking these medications   ibuprofen 200 MG tablet Commonly known as:  ADVIL,MOTRIN     TAKE these medications   acetaminophen 500 MG tablet Commonly known as:  TYLENOL Take 500 mg by mouth daily as needed for moderate pain or headache.   amLODipine 10 MG tablet Commonly known as:  NORVASC TAKE 1 TABLET BY MOUTH ONCE DAILY   aspirin EC 81 MG tablet Take 81 mg by mouth daily.   clopidogrel 75 MG tablet Commonly known as:  PLAVIX TAKE ONE TABLET BY MOUTH DAILY   diphenhydrAMINE 50 MG tablet Commonly known as:   BENADRYL Take 50-100 mg by mouth at bedtime as needed for sleep.   Fish Oil 1000 MG Caps Take 1,000 mg by mouth daily.   losartan 100 MG tablet Commonly known as:  COZAAR Take 100 mg by mouth daily.   metoprolol succinate 100 MG 24 hr tablet Commonly known as:  TOPROL-XL Take 100 mg by mouth daily. Take with or immediately following a meal.   metoprolol succinate 50 MG 24 hr tablet Commonly known as:  TOPROL-XL TAKE 1 TABLET BY MOUTH ONCE DAILY. TAKE  WITH  OR  IMMEDIATELY  FOLLOWING  A  MEAL   milk thistle 175 MG tablet Take 175 mg by mouth daily.   naphazoline-pheniramine 0.025-0.3 % ophthalmic solution Commonly known as:  NAPHCON-A Place 1 drop into both eyes 2 (two) times daily as needed for eye irritation.   nitroGLYCERIN 0.4 MG SL tablet Commonly known as:  NITROSTAT Place 1 tablet (0.4 mg total) under the tongue every 5 (five) minutes as needed for chest pain.   omeprazole 20 MG capsule Commonly known as:  PRILOSEC Take 20 mg by mouth daily as needed (acid reflux).   oxyCODONE 5 MG immediate release tablet Commonly known as:  Oxy IR/ROXICODONE Take 1-2 tablets (5-10 mg total) by mouth every 6 (six) hours as needed for moderate pain (pain score 4-6).      Follow-up Information    Aluisio, Frank, MD. Schedule an appointment as soon as possible for a visit on 12/17/2017.   Specialty:  Orthopedic Surgery Contact information: [address] [place] [address] [phone]           Signed:  , PA-C Orthopaedic Surgery 12/10/2017, 9:03 AM   

## 2017-12-20 DIAGNOSIS — S86012S Strain of left Achilles tendon, sequela: Secondary | ICD-10-CM | POA: Diagnosis not present

## 2018-01-03 DIAGNOSIS — S86012S Strain of left Achilles tendon, sequela: Secondary | ICD-10-CM | POA: Diagnosis not present

## 2018-08-12 DIAGNOSIS — I10 Essential (primary) hypertension: Secondary | ICD-10-CM | POA: Diagnosis not present

## 2018-08-12 DIAGNOSIS — K219 Gastro-esophageal reflux disease without esophagitis: Secondary | ICD-10-CM | POA: Diagnosis not present

## 2018-08-12 DIAGNOSIS — I2510 Atherosclerotic heart disease of native coronary artery without angina pectoris: Secondary | ICD-10-CM | POA: Diagnosis not present

## 2018-08-12 DIAGNOSIS — E785 Hyperlipidemia, unspecified: Secondary | ICD-10-CM | POA: Diagnosis not present

## 2018-08-13 DIAGNOSIS — Z79899 Other long term (current) drug therapy: Secondary | ICD-10-CM | POA: Diagnosis not present

## 2018-08-13 DIAGNOSIS — E785 Hyperlipidemia, unspecified: Secondary | ICD-10-CM | POA: Diagnosis not present

## 2018-08-13 DIAGNOSIS — I2510 Atherosclerotic heart disease of native coronary artery without angina pectoris: Secondary | ICD-10-CM | POA: Diagnosis not present

## 2018-10-22 DIAGNOSIS — S86012S Strain of left Achilles tendon, sequela: Secondary | ICD-10-CM | POA: Diagnosis not present

## 2018-11-13 DIAGNOSIS — Z131 Encounter for screening for diabetes mellitus: Secondary | ICD-10-CM | POA: Diagnosis not present

## 2018-11-13 DIAGNOSIS — E785 Hyperlipidemia, unspecified: Secondary | ICD-10-CM | POA: Diagnosis not present

## 2018-11-13 DIAGNOSIS — Z79899 Other long term (current) drug therapy: Secondary | ICD-10-CM | POA: Diagnosis not present

## 2018-12-02 DIAGNOSIS — I10 Essential (primary) hypertension: Secondary | ICD-10-CM | POA: Diagnosis not present

## 2018-12-02 DIAGNOSIS — I2510 Atherosclerotic heart disease of native coronary artery without angina pectoris: Secondary | ICD-10-CM | POA: Diagnosis not present

## 2018-12-02 DIAGNOSIS — E785 Hyperlipidemia, unspecified: Secondary | ICD-10-CM | POA: Diagnosis not present

## 2018-12-02 DIAGNOSIS — K219 Gastro-esophageal reflux disease without esophagitis: Secondary | ICD-10-CM | POA: Diagnosis not present

## 2019-03-04 DIAGNOSIS — Z20828 Contact with and (suspected) exposure to other viral communicable diseases: Secondary | ICD-10-CM | POA: Diagnosis not present

## 2019-03-06 ENCOUNTER — Other Ambulatory Visit: Payer: Self-pay

## 2019-03-06 ENCOUNTER — Encounter (HOSPITAL_COMMUNITY): Payer: Self-pay

## 2019-03-06 ENCOUNTER — Emergency Department (HOSPITAL_COMMUNITY)
Admission: EM | Admit: 2019-03-06 | Discharge: 2019-03-07 | Disposition: A | Discharge status: Home / Self Care | Payer: BLUE CROSS/BLUE SHIELD | Attending: Emergency Medicine | Admitting: Emergency Medicine

## 2019-03-06 DIAGNOSIS — U071 COVID-19: Secondary | ICD-10-CM | POA: Insufficient documentation

## 2019-03-06 DIAGNOSIS — E785 Hyperlipidemia, unspecified: Secondary | ICD-10-CM | POA: Diagnosis not present

## 2019-03-06 DIAGNOSIS — Z7982 Long term (current) use of aspirin: Secondary | ICD-10-CM | POA: Diagnosis not present

## 2019-03-06 DIAGNOSIS — I5042 Chronic combined systolic (congestive) and diastolic (congestive) heart failure: Secondary | ICD-10-CM | POA: Insufficient documentation

## 2019-03-06 DIAGNOSIS — I252 Old myocardial infarction: Secondary | ICD-10-CM | POA: Diagnosis not present

## 2019-03-06 DIAGNOSIS — I2510 Atherosclerotic heart disease of native coronary artery without angina pectoris: Secondary | ICD-10-CM | POA: Insufficient documentation

## 2019-03-06 DIAGNOSIS — J189 Pneumonia, unspecified organism: Secondary | ICD-10-CM | POA: Diagnosis not present

## 2019-03-06 DIAGNOSIS — Z7902 Long term (current) use of antithrombotics/antiplatelets: Secondary | ICD-10-CM | POA: Insufficient documentation

## 2019-03-06 DIAGNOSIS — Z9861 Coronary angioplasty status: Secondary | ICD-10-CM | POA: Insufficient documentation

## 2019-03-06 DIAGNOSIS — R5383 Other fatigue: Secondary | ICD-10-CM | POA: Diagnosis not present

## 2019-03-06 DIAGNOSIS — I110 Hypertensive heart disease with heart failure: Secondary | ICD-10-CM | POA: Diagnosis not present

## 2019-03-06 DIAGNOSIS — J1289 Other viral pneumonia: Secondary | ICD-10-CM | POA: Diagnosis not present

## 2019-03-06 DIAGNOSIS — Z79899 Other long term (current) drug therapy: Secondary | ICD-10-CM | POA: Insufficient documentation

## 2019-03-06 DIAGNOSIS — R05 Cough: Secondary | ICD-10-CM | POA: Diagnosis not present

## 2019-03-06 LAB — BASIC METABOLIC PANEL
Anion gap: 12 (ref 5–15)
BUN: 16 mg/dL (ref 6–20)
CO2: 21 mmol/L — ABNORMAL LOW (ref 22–32)
Calcium: 8.8 mg/dL — ABNORMAL LOW (ref 8.9–10.3)
Chloride: 103 mmol/L (ref 98–111)
Creatinine, Ser: 1.76 mg/dL — ABNORMAL HIGH (ref 0.61–1.24)
GFR calc Af Amer: 48 mL/min — ABNORMAL LOW (ref >60)
GFR calc non Af Amer: 41 mL/min — ABNORMAL LOW (ref >60)
Glucose, Bld: 104 mg/dL — ABNORMAL HIGH (ref 70–99)
Potassium: 4.3 mmol/L (ref 3.5–5.1)
Sodium: 136 mmol/L (ref 135–145)

## 2019-03-06 LAB — CBC
HCT: 52.6 % — ABNORMAL HIGH (ref 39.0–52.0)
Hemoglobin: 17.5 g/dL — ABNORMAL HIGH (ref 13.0–17.0)
MCH: 31.4 pg (ref 26.0–34.0)
MCHC: 33.3 g/dL (ref 30.0–36.0)
MCV: 94.3 fL (ref 80.0–100.0)
Platelets: 140 10*3/uL — ABNORMAL LOW (ref 150–400)
RBC: 5.58 MIL/uL (ref 4.22–5.81)
RDW: 12.6 % (ref 11.5–15.5)
WBC: 10.8 10*3/uL — ABNORMAL HIGH (ref 4.0–10.5)
nRBC: 0 % (ref 0.0–0.2)

## 2019-03-06 NOTE — ED Triage Notes (Signed)
Pt states that for the past week he has not felt good for the past week, fevers, generalized weakness, body aches, unable to eat, tested for covid on Tuesday and was Neg, pt wants flu test

## 2019-03-07 ENCOUNTER — Emergency Department (HOSPITAL_COMMUNITY): Payer: BLUE CROSS/BLUE SHIELD

## 2019-03-07 DIAGNOSIS — R05 Cough: Secondary | ICD-10-CM | POA: Diagnosis not present

## 2019-03-07 LAB — INFLUENZA PANEL BY PCR (TYPE A & B)
Influenza A By PCR: NEGATIVE
Influenza B By PCR: NEGATIVE

## 2019-03-07 MED ORDER — ONDANSETRON 4 MG PO TBDP: 4.0000 mg | ORAL_TABLET | Freq: Three times a day (TID) | ORAL | 0 refills | Status: DC | PRN

## 2019-03-07 MED ORDER — AZITHROMYCIN 250 MG PO TABS: 250.0000 mg | ORAL_TABLET | Freq: Every day | ORAL | 0 refills | Status: DC

## 2019-03-07 NOTE — ED Notes (Signed)
Pt wants to be called when he gets a room [phone]. Waiting in his car.

## 2019-03-07 NOTE — Discharge Instructions (Addendum)
°  Please take all of your antibiotics until finished!   You may develop abdominal discomfort or diarrhea from the antibiotic.  You may help offset this with probiotics which you can buy or get in yogurt. Do not eat or take the probiotics until 2 hours after your antibiotic.   Nausea/vomiting: Use the ondansetron (generic for Zofran) for nausea or vomiting.  This medication may not prevent all vomiting or nausea, but can help facilitate better hydration. Things that can help with nausea/vomiting also include peppermint/menthol candies, vitamin B12, and ginger.  You have a test pending for COVID-19.  Results typically return within about 48 hours.  Be sure to check MyChart for updated results.  We recommend isolating yourself until results are received.  Patients who have symptoms consistent with COVID-19 should self isolated until: At least 3 days (72 hours) have passed since recovery, defined as resolution of fever without the use of fever reducing medications and improvement in respiratory symptoms (e.g., cough, shortness of breath), and At least 7 days have passed since symptoms first appeared.  If you have no symptoms, but your test returns positive, recommend isolating for at least 10 days.

## 2019-03-07 NOTE — ED Provider Notes (Signed)
[facility] EMERGENCY DEPARTMENT Provider Note   CSN: [id number] Arrival date & time: 03/06/19  2121     History   Chief Complaint Chief Complaint  Patient presents with   Influenza    HPI David Chang is a 58 y.o. male.     HPI  David Chang is a 58 y.o. male, with a history of CAD, hyperlipidemia, HTN, presenting to the ED with fatigue, generalized weakness, subjective fever, and "a slight cough" for about the past week. Patient denies contact with known or suspected COVID-19 patients. Denies shortness of breath, chest pain, abdominal pain, N/V/D, urinary symptoms, syncope, or any other complaints.   Past Medical History:  Diagnosis Date   Arthritis    Coronary artery disease    Hyperlipidemia LDL goal <70 04/10/2015   Hypertension    Ischemic cardiomyopathy 04/10/2015   S/P angioplasty with stent to mLAD DES, pRCA DES, mLCX DES 04/08/15  04/10/2015    Patient Active Problem List   Diagnosis Date Noted   Achilles tendon rupture, left, initial encounter 12/06/2017   OA (osteoarthritis) of knee 04/24/2016   Chronic combined systolic and diastolic heart failure, NYHA class 2 ([place]) 06/30/2015   S/P angioplasty with stent to mLAD DES, pRCA DES, mLCX DES 04/08/15  04/10/2015   Hyperlipidemia LDL goal <70 04/10/2015   Ischemic cardiomyopathy 04/10/2015   Non-STEMI (non-ST elevated myocardial infarction) ([place]) 04/08/2015   Coronary artery disease involving native coronary artery of native heart with unstable angina pectoris ([place])    Hypertension, essential 04/07/2015    Past Surgical History:  Procedure Laterality Date   ACHILLES TENDON SURGERY Left 12/06/2017   Procedure: Complete Left Achilles tendon repair; dermaspan graf, application of cast;  Surgeon: Latanya Maudlin, MD;  Location: [facility];  Service: Orthopedics;  Laterality: Left;   achillies repair     and medial gastrocnemius tear repair   CARDIAC CATHETERIZATION  N/A 04/08/2015   Procedure: Left Heart Cath and Coronary Angiography;  Surgeon: Leonie Man, MD;  Location: [place] [facility];  Service: Cardiovascular;  Laterality: N/A;   KNEE SURGERY Left    left tricep surgery      Right tricep repair also   TOTAL KNEE ARTHROPLASTY Left 04/24/2016   Procedure: LEFT TOTAL KNEE ARTHROPLASTY;  Surgeon: Gaynelle Arabian, MD;  [facility];  Service: Orthopedics;  Laterality: Left;        Home Medications    Prior to Admission medications   Medication Sig Start Date End Date Taking? Authorizing Provider  amLODipine (NORVASC) 10 MG tablet TAKE 1 TABLET BY MOUTH ONCE DAILY Patient taking differently: Take 10 mg by mouth daily.  12/29/16  Yes Belva Crome, MD  aspirin EC 81 MG tablet Take 81 mg by mouth daily.   Yes [provider]  clopidogrel (PLAVIX) 75 MG tablet TAKE ONE TABLET BY MOUTH DAILY Patient taking differently: Take 75 mg by mouth daily.  05/07/17  Yes Belva Crome, MD  losartan (COZAAR) 100 MG tablet Take 100 mg by mouth daily. 02/05/16  Yes [provider]  metoprolol succinate (TOPROL-XL) 100 MG 24 hr tablet Take 100 mg by mouth daily. Take with or immediately following a meal.   Yes [provider]  nitroGLYCERIN (NITROSTAT) 0.4 MG SL tablet Place 1 tablet (0.4 mg total) under the tongue every 5 (five) minutes as needed for chest pain. 04/10/15  Yes Isaiah Serge, NP  azithromycin (ZITHROMAX) 250 MG tablet Take 1 tablet (250 mg  total) by mouth daily. Take first 2 tablets together, then 1 every day until finished. 03/07/19   Teondra Newburg C, PA-C  metoprolol succinate (TOPROL-XL) 50 MG 24 hr tablet TAKE 1 TABLET BY MOUTH ONCE DAILY. TAKE  WITH  OR  IMMEDIATELY  FOLLOWING  A  MEAL Patient not taking: Reported on 03/07/2019 10/26/17   Belva Crome, MD  ondansetron (ZOFRAN ODT) 4 MG disintegrating tablet Take 1 tablet (4 mg total) by mouth every 8 (eight) hours as needed for nausea or vomiting. 03/07/19   Chyrel Taha,  Gertie Broerman C, PA-C  oxyCODONE (OXY IR/ROXICODONE) 5 MG immediate release tablet Take 1-2 tablets (5-10 mg total) by mouth every 6 (six) hours as needed for moderate pain (pain score 4-6). Patient not taking: Reported on 03/07/2019 12/07/17   Ardeen Jourdain, PA-C    Family History Family History  Problem Relation Age of Onset   CVA Other    Cancer Father 18       LUNG   Heart attack Paternal Grandfather 74   Hypertension Sister     Social History Social History   Tobacco Use   Smoking status: Never Smoker   Smokeless tobacco: Never Used  Substance Use Topics   Alcohol use: Yes    Alcohol/week: 0.0 standard drinks    Comment: casual drinking on Saturday    Drug use: No     Allergies   Patient has no known allergies.   Review of Systems Review of Systems  Constitutional: Positive for fatigue and fever.  Respiratory: Positive for cough. Negative for shortness of breath.   Cardiovascular: Negative for chest pain.  Gastrointestinal: Negative for abdominal pain, diarrhea, nausea and vomiting.  Genitourinary: Negative for dysuria, frequency and hematuria.  Musculoskeletal: Negative for back pain, neck pain and neck stiffness.  Neurological: Positive for weakness. Negative for syncope.  All other systems reviewed and are negative.    Physical Exam Updated Vital Signs BP 140/76    Pulse 98    Temp 99.5 F (37.5 C) (Oral)    Resp 18    SpO2 93%   Physical Exam Vitals signs and nursing note reviewed.  Constitutional:      General: He is not in acute distress.    Appearance: He is well-developed. He is not diaphoretic.  HENT:     Head: Normocephalic and atraumatic.     Mouth/Throat:     Mouth: Mucous membranes are moist.     Pharynx: Oropharynx is clear.  Eyes:     Conjunctiva/sclera: Conjunctivae normal.  Neck:     Musculoskeletal: Neck supple.  Cardiovascular:     Rate and Rhythm: Normal rate and regular rhythm.     Pulses: Normal pulses.          Radial  pulses are 2+ on the right side and 2+ on the left side.       Posterior tibial pulses are 2+ on the right side and 2+ on the left side.     Heart sounds: Normal heart sounds.     Comments: Tactile temperature in the extremities appropriate and equal bilaterally. Pulmonary:     Effort: Pulmonary effort is normal. No respiratory distress.     Breath sounds: Normal breath sounds.  Abdominal:     Palpations: Abdomen is soft.     Tenderness: There is no abdominal tenderness. There is no guarding.  Musculoskeletal:     Right lower leg: No edema.     Left lower leg: No edema.  Lymphadenopathy:  Cervical: No cervical adenopathy.  Skin:    General: Skin is warm and dry.  Neurological:     Mental Status: He is alert.  Psychiatric:        Mood and Affect: Mood and affect normal.        Speech: Speech normal.        Behavior: Behavior normal.      ED Treatments / Results  Labs (all labs ordered are listed, but only abnormal results are displayed) Labs Reviewed  CBC - Abnormal; Notable for the following components:      Result Value   WBC 10.8 (*)    Hemoglobin 17.5 (*)    HCT 52.6 (*)    Platelets 140 (*)    All other components within normal limits  BASIC METABOLIC PANEL - Abnormal; Notable for the following components:   CO2 21 (*)    Glucose, Bld 104 (*)    Creatinine, Ser 1.76 (*)    Calcium 8.8 (*)    GFR calc non Af Amer 41 (*)    GFR calc Af Amer 48 (*)    All other components within normal limits  NOVEL CORONAVIRUS, NAA (HOSP ORDER, SEND-OUT TO REF LAB; TAT 18-24 HRS)  INFLUENZA PANEL BY PCR (TYPE A & B)   BUN  Date Value Ref Range Status  03/06/2019 16 6 - 20 mg/dL Final  12/05/2017 26 (H) 6 - 20 mg/dL Final    Comment:    Please note change in reference range.  04/25/2016 24 (H) 6 - 20 mg/dL Final  04/17/2016 31 (H) 6 - 20 mg/dL Final   Creatinine, Ser  Date Value Ref Range Status  03/06/2019 1.76 (H) 0.61 - 1.24 mg/dL Final  12/05/2017 1.64 (H) 0.61 -  1.24 mg/dL Final  04/25/2016 1.47 (H) 0.61 - 1.24 mg/dL Final  04/17/2016 1.67 (H) 0.61 - 1.24 mg/dL Final     EKG EKG Interpretation  Date/Time:  Friday March 07 2019 05:51:17 EDT Ventricular Rate:  94 PR Interval:    QRS Duration: 88 QT Interval:  346 QTC Calculation: 433 R Axis:   26 Text Interpretation:  Sinus rhythm Consider left atrial enlargement Consider left ventricular hypertrophy Nonspecific T abnormalities, inferior leads movement artifact Otherwise no significant change Confirmed by Addison Lank [phone]) on 03/07/2019 5:53:59 AM Also confirmed by Addison Lank [id number]), editor Hattie Perch ([id number])  on 03/07/2019 2:06:08 PM   Radiology Dg Chest Portable 1 View  Result Date: 03/07/2019 CLINICAL DATA:  Cough, weakness EXAM: PORTABLE CHEST 1 VIEW COMPARISON:  Radiograph 04/07/2015 FINDINGS: There is patchy interstitial and airspace opacity in both lungs but most pronounced in the right infrahilar lung. No pneumothorax or effusion. Mild cardiomegaly, similar to comparison. Degenerative changes are present in the imaged spine and shoulders. No acute osseous or soft tissue abnormality. IMPRESSION: Patchy interstitial and airspace opacity in both lungs, most pronounced in the right infrahilar lung, suspicious for pneumonia. Recommend follow-up chest x-ray in 4-6 weeks after treatment. Electronically Signed   By: Lovena Le M.D.   On: 03/07/2019 06:01    Procedures Procedures (including critical care time)  Medications Ordered in ED Medications - No data to display   Initial Impression / Assessment and Plan / ED Course  I have reviewed the triage vital signs and the nursing notes.  Pertinent labs & imaging results that were available during my care of the patient were reviewed by me and considered in my medical decision making (see chart for details).  Patient presents with generalized weakness, fatigue, subjective fever, and mild cough. Patient is  nontoxic appearing, afebrile, not tachycardic, not tachypneic, not hypotensive, and is in no apparent distress.  Patient does have a slightly lower than normal SPO2, however, he displays no increased work of breathing and lungs are clear to auscultation. He has no noted difficulty with ambulation. Evidence of possible pneumonia noted on chest x-ray.  COVID-19 infection is a consideration and this was communicated to the patient.  COVID-19 test pending. The patient was given instructions for home care as well as return precautions. Patient voices understanding of these instructions, accepts the plan, and is comfortable with discharge.   David Chang was evaluated in Emergency Department on 03/07/2019 for the symptoms described in the history of present illness. He was evaluated in the context of the global COVID-19 pandemic, which necessitated consideration that the patient might be at risk for infection with the SARS-CoV-2 virus that causes COVID-19. Institutional protocols and algorithms that pertain to the evaluation of patients at risk for COVID-19 are in a state of rapid change based on information released by regulatory bodies including the CDC and federal and state organizations. These policies and algorithms were followed during the patient's care in the ED.  Vitals:   03/06/19 2128 03/07/19 0338 03/07/19 0746  BP: 129/86 140/76 136/82  Pulse: (!) 109 98 98  Resp: 16 18 16   Temp: 99.5 F (37.5 C)    TempSrc: Oral    SpO2: 90% 93% 92%     Final Clinical Impressions(s) / ED Diagnoses   Final diagnoses:  Pneumonia of both lungs due to infectious organism, unspecified part of lung    ED Discharge Orders         Ordered    ondansetron (ZOFRAN ODT) 4 MG disintegrating tablet  Every 8 hours PRN     03/07/19 0732    azithromycin (ZITHROMAX) 250 MG tablet  Daily     03/07/19 0732           Lorayne Bender, PA-C 03/08/19 2003    Fatima Blank, MD 03/10/19 [phone]

## 2019-03-07 NOTE — ED Notes (Signed)
Paula in microbiology advised that patient's flu and send out coronavirus test could both be performed on red top universal viral transport test, this RN was also advised that there would only be enough sample to run both tests once and that if an error occurred during processing of test, another sample would have to be collected.

## 2019-03-10 DIAGNOSIS — Z6829 Body mass index (BMI) 29.0-29.9, adult: Secondary | ICD-10-CM | POA: Diagnosis not present

## 2019-03-10 DIAGNOSIS — U071 COVID-19: Secondary | ICD-10-CM | POA: Diagnosis not present

## 2019-03-10 LAB — NOVEL CORONAVIRUS, NAA (HOSP ORDER, SEND-OUT TO REF LAB; TAT 18-24 HRS): SARS-CoV-2, NAA: DETECTED — AB

## 2019-04-15 DIAGNOSIS — M79672 Pain in left foot: Secondary | ICD-10-CM | POA: Diagnosis not present

## 2019-04-23 DIAGNOSIS — S43432A Superior glenoid labrum lesion of left shoulder, initial encounter: Secondary | ICD-10-CM | POA: Diagnosis not present

## 2019-04-23 DIAGNOSIS — M25512 Pain in left shoulder: Secondary | ICD-10-CM | POA: Diagnosis not present

## 2019-04-23 DIAGNOSIS — M19019 Primary osteoarthritis, unspecified shoulder: Secondary | ICD-10-CM | POA: Diagnosis not present

## 2019-04-29 DIAGNOSIS — M25512 Pain in left shoulder: Secondary | ICD-10-CM | POA: Diagnosis not present

## 2019-06-15 NOTE — Progress Notes (Signed)
Cardiology Office Note:    Date:  06/16/2019   ID:  TRACIE SUCHECKI, DOB 1961/04/22, MRN [id number]  PCP:  Ernestene Kiel, MD  Cardiologist:  Sinclair Grooms, MD   Referring MD: Ernestene Kiel, MD   Chief Complaint  Patient presents with  . Coronary Artery Disease  . Hyperlipidemia    History of Present Illness:    David Chang is a 59 y.o. male with a hx of CAD with multivessel DES including RCA, circumflex, and LAD all in 2016. Hyperlipidemia not currently treated with statin therapy due to musculoskeletal syndrome, hypertension, and ischemic left ventricular dysfunction(EF 45-50%).  In the interval since our last visit, there has been no chest pain, shortness of breath, claudication, transient neurological symptoms, or hospitalizations.  Works out 2 hours, 4 days/week.  With moderate isometric and aerobic activity, he has not had symptoms.  He is on no therapy for lipids and has not had a recent lipid panel.  He has statin intolerance.  Past Medical History:  Diagnosis Date  . Arthritis   . Coronary artery disease   . Hyperlipidemia LDL goal <70 04/10/2015  . Hypertension   . Ischemic cardiomyopathy 04/10/2015  . S/P angioplasty with stent to mLAD DES, pRCA DES, mLCX DES 04/08/15  04/10/2015    Past Surgical History:  Procedure Laterality Date  . ACHILLES TENDON SURGERY Left 12/06/2017   Procedure: Complete Left Achilles tendon repair; dermaspan graf, application of cast;  Surgeon: Latanya Maudlin, MD;  [facility];  Service: Orthopedics;  Laterality: Left;  . achillies repair     and medial gastrocnemius tear repair  . CARDIAC CATHETERIZATION N/A 04/08/2015   Procedure: Left Heart Cath and Coronary Angiography;  Surgeon: Leonie Man, MD;  Location: [place] [facility] LAB;  Service: Cardiovascular;  Laterality: N/A;  . KNEE SURGERY Left   . left tricep surgery      Right tricep repair also  . TOTAL KNEE ARTHROPLASTY Left 04/24/2016   Procedure:  LEFT TOTAL KNEE ARTHROPLASTY;  Surgeon: Gaynelle Arabian, MD;  Location[facility];  Service: Orthopedics;  Laterality: Left;    Current Medications: Current Meds  Medication Sig  . amLODipine (NORVASC) 10 MG tablet TAKE 1 TABLET BY MOUTH ONCE DAILY  . aspirin EC 81 MG tablet Take 81 mg by mouth daily.  . clopidogrel (PLAVIX) 75 MG tablet TAKE ONE TABLET BY MOUTH DAILY  . losartan (COZAAR) 100 MG tablet Take 100 mg by mouth daily.  . metoprolol succinate (TOPROL-XL) 100 MG 24 hr tablet Take 100 mg by mouth daily. Take with or immediately following a meal.  . metoprolol succinate (TOPROL-XL) 50 MG 24 hr tablet TAKE 1 TABLET BY MOUTH ONCE DAILY. TAKE  WITH  OR  IMMEDIATELY  FOLLOWING  A  MEAL  . nitroGLYCERIN (NITROSTAT) 0.4 MG SL tablet Place 1 tablet (0.4 mg total) under the tongue every 5 (five) minutes as needed for chest pain.  . Omega-3 Fatty Acids (FISH OIL) 1000 MG CAPS Take 1 capsule by mouth daily.  . Red Yeast Rice 600 MG CAPS Take 1 capsule by mouth daily.     Allergies:   Patient has no known allergies.   Social History   Socioeconomic History  . Marital status: Married    Spouse name: Not on file  . Number of children: Not on file  . Years of education: Not on file  . Highest education level: Not on file  Occupational History  . Not on file  Tobacco Use  . Smoking status: Never Smoker  . Smokeless tobacco: Never Used  Substance and Sexual Activity  . Alcohol use: Yes    Alcohol/week: 0.0 standard drinks    Comment: casual drinking on Saturday   . Drug use: No  . Sexual activity: Yes  Other Topics Concern  . Not on file  Social History Narrative  . Not on file   Social Determinants of Health   Financial Resource Strain:   . Difficulty of Paying Living Expenses: Not on file  Food Insecurity:   . Worried About Charity fundraiser in the Last Year: Not on file  . Ran Out of Food in the Last Year: Not on file  Transportation Needs:   . Lack of Transportation  (Medical): Not on file  . Lack of Transportation (Non-Medical): Not on file  Physical Activity:   . Days of Exercise per Week: Not on file  . Minutes of Exercise per Session: Not on file  Stress:   . Feeling of Stress : Not on file  Social Connections:   . Frequency of Communication with Friends and Family: Not on file  . Frequency of Social Gatherings with Friends and Family: Not on file  . Attends Religious Services: Not on file  . Active Member of Clubs or Organizations: Not on file  . Attends Archivist Meetings: Not on file  . Marital Status: Not on file     Family History: The patient's family history includes CVA in an other family member; Cancer (age of onset: 9) in his father; Heart attack (age of onset: 75) in his paternal grandfather; Hypertension in his sister.  ROS:   Please see the history of present illness.    He denies current medication side effects.  He is getting greater than 6 hours of sleep per night.  No bleeding on Plavix.  All other systems reviewed and are negative.  EKGs/Labs/Other Studies Reviewed:    The following studies were reviewed today: No new functional data.    EKG:  EKG performed 03/07/2019 demonstrates prominent voltage, nonspecific anterolateral T wave abnormality/inversion.  No change compared to prior tracings.  Recent Labs: 03/06/2019: BUN 16; Creatinine, Ser 1.76; Hemoglobin 17.5; Platelets 140; Potassium 4.3; Sodium 136  Recent Lipid Panel    Component Value Date/Time   CHOL 251 (H) 04/08/2015 0545   TRIG 124 04/08/2015 0545   HDL 23 (L) 04/08/2015 0545   CHOLHDL 10.9 04/08/2015 0545   VLDL 25 04/08/2015 0545   LDLCALC 203 (H) 04/08/2015 0545    Physical Exam:    VS:  BP (!) 144/92   Pulse 91   Ht 6\' 1"  (1.854 m)   Wt 227 lb (103 kg)   SpO2 95%   BMI 29.95 kg/m     Wt Readings from Last 3 Encounters:  06/16/19 227 lb (103 kg)  12/06/17 225 lb (102.1 kg)  12/04/17 225 lb (102.1 kg)     GEN: Masked.. No  acute distress HEENT: Normal NECK: No JVD. LYMPHATICS: No lymphadenopathy CARDIAC:  RRR without murmur, gallop, or edema. VASCULAR:  Normal Pulses. No bruits. RESPIRATORY:  Clear to auscultation without rales, wheezing or rhonchi  ABDOMEN: Soft, non-tender, non-distended, No pulsatile mass, MUSCULOSKELETAL: No deformity  SKIN: Warm and dry NEUROLOGIC:  Alert and oriented x 3 PSYCHIATRIC:  Normal affect   ASSESSMENT:    1. Coronary artery disease involving native coronary artery of native heart with unstable angina pectoris ([place])   2. Hyperlipidemia LDL  goal <70   3. Hypertension, essential   4. Chronic combined systolic and diastolic heart failure, NYHA class 2 ([place])   5. Educated about COVID-19 virus infection    PLAN:    In order of problems listed above:  1. Secondary prevention discussed.  Still on dual antiplatelet therapy.  May consider switching to stand-alone clopidogrel and dropping aspirin. 2. Will have up-to-date lipid values performed with his new physician and results will be faxed to [place].  I will then give recommendations based upon findings.  We discussed Nexletol/ezetimibe.  He has apparently had difficulty with musculoskeletal pain on statin therapy.  We also discussed PCSK9.  All 3 territories are stented and therefore he has high risk. 3. Borderline.  Low-salt diet discussed.  Cutting back on alcohol discussed.  He monitors at home and states that he usually runs under [id number] mmHg systolic and in the low 80 millimeters mercury range for diastolic. 4. No symptoms to suggest volume overload or heart failure. 5. 3W's and Covid vaccine have been discussed.  The patient has been vaccinated.  Overall education and awareness concerning primary/secondary risk prevention was discussed in detail: LDL less than 70, hemoglobin A1c less than 7, blood pressure target less than 130/80 mmHg, >150 minutes of moderate aerobic activity per week, avoidance of smoking, weight control (via  diet and exercise), and continued surveillance/management of/for obstructive sleep apnea.    Medication Adjustments/Labs and Tests Ordered: Current medicines are reviewed at length with the patient today.  Concerns regarding medicines are outlined above.  No orders of the defined types were placed in this encounter.  No orders of the defined types were placed in this encounter.   Patient Instructions  Medication Instructions:  Your physician recommends that you continue on your current medications as directed. Please refer to the Current Medication list given to you today.  *If you need a refill on your cardiac medications before your next appointment, please call your pharmacy*  Lab Work: Please have your new Primary Care fax your most recent labs over to our office.  We will take anything they drew, but really want to see Cholesterol and liver panel.  Our fax number is [phone].   If you have labs (blood work) drawn today and your tests are completely normal, you will receive your results only by: Marland Kitchen MyChart Message (if you have MyChart) OR . A paper copy in the mail If you have any lab test that is abnormal or we need to change your treatment, we will call you to review the results.  Testing/Procedures: None  Follow-Up: At [facility], you and your health needs are our priority.  As part of our continuing mission to provide you with exceptional heart care, we have created designated Provider Care Teams.  These Care Teams include your primary Cardiologist (physician) and Advanced Practice Providers (APPs -  Physician Assistants and Nurse Practitioners) who all work together to provide you with the care you need, when you need it.  Your next appointment:   12 month(s)  The format for your next appointment:   In Person  Provider:   You may see Sinclair Grooms, MD or one of the following Advanced Practice Providers on your designated Care Team:    Truitt Merle,  NP  Cecilie Kicks, NP  Kathyrn Drown, NP   Other Instructions      Signed, Sinclair Grooms, MD  06/16/2019 9:10 AM    [place]

## 2019-06-16 ENCOUNTER — Encounter: Payer: Self-pay | Admitting: Interventional Cardiology

## 2019-06-16 ENCOUNTER — Other Ambulatory Visit: Payer: Self-pay

## 2019-06-16 ENCOUNTER — Ambulatory Visit: Payer: BC Managed Care – PPO | Admitting: Interventional Cardiology

## 2019-06-16 VITALS — BP 144/92 | HR 91 | Ht 73.0 in | Wt 227.0 lb

## 2019-06-16 DIAGNOSIS — E785 Hyperlipidemia, unspecified: Secondary | ICD-10-CM | POA: Diagnosis not present

## 2019-06-16 DIAGNOSIS — Z7189 Other specified counseling: Secondary | ICD-10-CM

## 2019-06-16 DIAGNOSIS — I25110 Atherosclerotic heart disease of native coronary artery with unstable angina pectoris: Secondary | ICD-10-CM

## 2019-06-16 DIAGNOSIS — I10 Essential (primary) hypertension: Secondary | ICD-10-CM

## 2019-06-16 DIAGNOSIS — I5042 Chronic combined systolic (congestive) and diastolic (congestive) heart failure: Secondary | ICD-10-CM | POA: Diagnosis not present

## 2019-06-16 DIAGNOSIS — I110 Hypertensive heart disease with heart failure: Secondary | ICD-10-CM | POA: Diagnosis not present

## 2019-06-16 NOTE — Patient Instructions (Signed)
Medication Instructions:  Your physician recommends that you continue on your current medications as directed. Please refer to the Current Medication list given to you today.  *If you need a refill on your cardiac medications before your next appointment, please call your pharmacy*  Lab Work: Please have your new Primary Care fax your most recent labs over to our office.  We will take anything they drew, but really want to see Cholesterol and liver panel.  Our fax number is [phone].   If you have labs (blood work) drawn today and your tests are completely normal, you will receive your results only by: Marland Kitchen MyChart Message (if you have MyChart) OR . A paper copy in the mail If you have any lab test that is abnormal or we need to change your treatment, we will call you to review the results.  Testing/Procedures: None  Follow-Up: At [facility], you and your health needs are our priority.  As part of our continuing mission to provide you with exceptional heart care, we have created designated Provider Care Teams.  These Care Teams include your primary Cardiologist (physician) and Advanced Practice Providers (APPs -  Physician Assistants and Nurse Practitioners) who all work together to provide you with the care you need, when you need it.  Your next appointment:   12 month(s)  The format for your next appointment:   In Person  Provider:   You may see Sinclair Grooms, MD or one of the following Advanced Practice Providers on your designated Care Team:    Truitt Merle, NP  Cecilie Kicks, NP  Kathyrn Drown, NP   Other Instructions

## 2019-06-19 DIAGNOSIS — Z13220 Encounter for screening for lipoid disorders: Secondary | ICD-10-CM | POA: Diagnosis not present

## 2019-06-19 DIAGNOSIS — Z13228 Encounter for screening for other metabolic disorders: Secondary | ICD-10-CM | POA: Diagnosis not present

## 2019-06-19 DIAGNOSIS — F5101 Primary insomnia: Secondary | ICD-10-CM | POA: Diagnosis not present

## 2019-06-19 DIAGNOSIS — K219 Gastro-esophageal reflux disease without esophagitis: Secondary | ICD-10-CM | POA: Diagnosis not present

## 2019-06-19 DIAGNOSIS — Z130 Encounter for screening for diseases of the blood and blood-forming organs and certain disorders involving the immune mechanism: Secondary | ICD-10-CM | POA: Diagnosis not present

## 2019-06-19 DIAGNOSIS — Z8679 Personal history of other diseases of the circulatory system: Secondary | ICD-10-CM | POA: Diagnosis not present

## 2019-06-19 DIAGNOSIS — I10 Essential (primary) hypertension: Secondary | ICD-10-CM | POA: Diagnosis not present

## 2019-06-19 DIAGNOSIS — Z0000 Encounter for general adult medical examination without abnormal findings: Secondary | ICD-10-CM | POA: Diagnosis not present

## 2019-06-19 DIAGNOSIS — R748 Abnormal levels of other serum enzymes: Secondary | ICD-10-CM | POA: Diagnosis not present

## 2019-06-19 DIAGNOSIS — Z125 Encounter for screening for malignant neoplasm of prostate: Secondary | ICD-10-CM | POA: Diagnosis not present

## 2019-08-25 DIAGNOSIS — L821 Other seborrheic keratosis: Secondary | ICD-10-CM | POA: Diagnosis not present

## 2019-08-25 DIAGNOSIS — L578 Other skin changes due to chronic exposure to nonionizing radiation: Secondary | ICD-10-CM | POA: Diagnosis not present

## 2019-08-25 DIAGNOSIS — C44319 Basal cell carcinoma of skin of other parts of face: Secondary | ICD-10-CM | POA: Diagnosis not present

## 2020-01-14 DIAGNOSIS — M19012 Primary osteoarthritis, left shoulder: Secondary | ICD-10-CM | POA: Diagnosis not present

## 2020-03-08 DIAGNOSIS — E782 Mixed hyperlipidemia: Secondary | ICD-10-CM | POA: Diagnosis not present

## 2020-03-08 DIAGNOSIS — K219 Gastro-esophageal reflux disease without esophagitis: Secondary | ICD-10-CM | POA: Diagnosis not present

## 2020-03-08 DIAGNOSIS — F5101 Primary insomnia: Secondary | ICD-10-CM | POA: Diagnosis not present

## 2020-03-08 DIAGNOSIS — I10 Essential (primary) hypertension: Secondary | ICD-10-CM | POA: Diagnosis not present

## 2020-03-23 DIAGNOSIS — K824 Cholesterolosis of gallbladder: Secondary | ICD-10-CM | POA: Diagnosis not present

## 2020-03-23 DIAGNOSIS — R748 Abnormal levels of other serum enzymes: Secondary | ICD-10-CM | POA: Diagnosis not present

## 2020-04-14 DIAGNOSIS — M19012 Primary osteoarthritis, left shoulder: Secondary | ICD-10-CM | POA: Diagnosis not present

## 2020-04-19 ENCOUNTER — Telehealth: Payer: Self-pay | Admitting: Interventional Cardiology

## 2020-04-19 DIAGNOSIS — R072 Precordial pain: Secondary | ICD-10-CM

## 2020-04-19 NOTE — Telephone Encounter (Signed)
Pt states that Saturday and today he noticed CP while on the elliptical after his HR got above 125.   Resolves quickly once he gets off.  HR comes down back to normal with no issue.  Has not had to use nitro.  Denies any other symptoms.  Didn't have BP and HR readings with him but states they are always normal.   Pt works out regularly with no issues.  This has just been something that developed Saturday and today.  States that this CP is completely different than what he had when he had his stents placed.  Advised I will send to Dr. Tamala Julian for review.  Reviewed ER precautions.

## 2020-04-19 NOTE — Telephone Encounter (Signed)
Pt c/o of Chest Pain: STAT if CP now or developed within 24 hours  1. Are you having CP right now? No   2. Are you experiencing any other symptoms (ex. SOB, nausea, vomiting, sweating)? No   3. How long have you been experiencing CP? Last couple weeks when working out   4. Is your CP continuous or coming and going? Goes away when stops working out   5. Have you taken Nitroglycerin? No  ? David Chang states for the past few weeks any time his HR gets above 125 when working out he begins to have chest discomfort that stops when he stops working out. He is requesting an appointment to be seen in regards to this. Please advise.

## 2020-04-20 ENCOUNTER — Other Ambulatory Visit: Payer: Self-pay | Admitting: *Deleted

## 2020-04-20 MED ORDER — METOPROLOL SUCCINATE ER 200 MG PO TB24: 200.0000 mg | ORAL_TABLET | Freq: Every day | ORAL | 3 refills | Status: DC

## 2020-04-20 NOTE — Telephone Encounter (Signed)
Spoke with pt and made him aware of recommendations per Dr. Tamala Julian.  Reviewed instructions for stress test.  Pt verbalized understanding and was appreciative for call.

## 2020-04-20 NOTE — Telephone Encounter (Signed)
Left message to call back  

## 2020-04-20 NOTE — Telephone Encounter (Signed)
Increase Toprol to 200 mg/day.  Measure blood pressures.  Avoid activity that precipitates chest pain.  Lexiscan myocardial perfusion imaging within the next 7 to 10 days

## 2020-04-26 ENCOUNTER — Telehealth (HOSPITAL_COMMUNITY): Payer: Self-pay

## 2020-04-26 NOTE — Telephone Encounter (Signed)
Detailed instructions left on the patient's answering machine. Asked to call back with any questions. S.Kema Santaella EMTP 

## 2020-04-29 ENCOUNTER — Ambulatory Visit (HOSPITAL_COMMUNITY): Payer: BLUE CROSS/BLUE SHIELD | Attending: Internal Medicine

## 2020-04-29 ENCOUNTER — Other Ambulatory Visit: Payer: Self-pay

## 2020-04-29 DIAGNOSIS — R072 Precordial pain: Secondary | ICD-10-CM | POA: Insufficient documentation

## 2020-04-29 LAB — MYOCARDIAL PERFUSION IMAGING
LV dias vol: 164 mL (ref 62–150)
LV sys vol: 76 mL
Peak HR: 74 {beats}/min
Rest HR: 60 {beats}/min
SDS: 5
SRS: 2
SSS: 7
TID: 1.06

## 2020-04-29 MED ORDER — TECHNETIUM TC 99M TETROFOSMIN IV KIT
31.3000 | PACK | Freq: Once | INTRAVENOUS | Status: AC | PRN
  Administered 2020-04-29: 31.3 via INTRAVENOUS
  Filled 2020-04-29: qty 32

## 2020-04-29 MED ORDER — REGADENOSON 0.4 MG/5ML IV SOLN
0.4000 mg | Freq: Once | INTRAVENOUS | Status: AC
  Administered 2020-04-29: 0.4 mg via INTRAVENOUS

## 2020-04-29 MED ORDER — TECHNETIUM TC 99M TETROFOSMIN IV KIT
8.9000 | PACK | Freq: Once | INTRAVENOUS | Status: AC | PRN
  Administered 2020-04-29: 8.9 via INTRAVENOUS
  Filled 2020-04-29: qty 9

## 2020-04-30 ENCOUNTER — Telehealth: Payer: Self-pay | Admitting: Interventional Cardiology

## 2020-04-30 NOTE — Telephone Encounter (Signed)
Patient is returning a call for results, call was transferred to [facility].

## 2020-05-02 NOTE — H&P (View-Only) (Signed)
Cardiology Office Note:    Date:  05/04/2020   ID:  David Chang, DOB [birth year], MRN [id number]  PCP:  Ernestene Kiel, MD  Cardiologist:  Sinclair Grooms, MD   Referring MD: Ernestene Kiel, MD   Chief Complaint  Patient presents with  . Coronary Artery Disease  . Hyperlipidemia  . Hypertension  . Chronic Kidney Disease    History of Present Illness:    David Chang is a 59 y.o. male with a hx of CAD with multivessel DES including RCA, circumflex, and LAD all in 2016. Hyperlipidemia not currently treated with statin therapy due to musculoskeletal syndrome, hypertension, and ischemic left ventricular dysfunction(EF 45-50%).  Less than 4-week history of exertional central chest pressure.  Discomfort occurs when heart rate gets above 125 bpm.  It has not been improved by uptitrating beta-blocker therapy.  Recent nuclear scintigraphy demonstrated inferior perfusion abnormality with partial redistribution suggesting ischemia.  Since that abnormal stress test, he has not exercise being on a threshold which induces discomfort.  He has not had any discomfort at rest.  Past Medical History:  Diagnosis Date  . Arthritis   . Coronary artery disease   . Hyperlipidemia LDL goal <70 04/10/2015  . Hypertension   . Ischemic cardiomyopathy 04/10/2015  . S/P angioplasty with stent to mLAD DES, pRCA DES, mLCX DES 04/08/15  04/10/2015    Past Surgical History:  Procedure Laterality Date  . ACHILLES TENDON SURGERY Left 12/06/2017   Procedure: Complete Left Achilles tendon repair; dermaspan graf, application of cast;  Surgeon: Latanya Maudlin, MD;  Location: [facility];  Service: Orthopedics;  Laterality: Left;  . achillies repair     and medial gastrocnemius tear repair  . CARDIAC CATHETERIZATION N/A 04/08/2015   Procedure: Left Heart Cath and Coronary Angiography;  Surgeon: Leonie Man, MD;  Location: [facility];  Service: Cardiovascular;  Laterality: N/A;  . KNEE  SURGERY Left   . left tricep surgery      Right tricep repair also  . TOTAL KNEE ARTHROPLASTY Left 04/24/2016   Procedure: LEFT TOTAL KNEE ARTHROPLASTY;  Surgeon: Gaynelle Arabian, MD;  Location: [facility];  Service: Orthopedics;  Laterality: Left;    Current Medications: Current Meds  Medication Sig  . amLODipine (NORVASC) 10 MG tablet TAKE 1 TABLET BY MOUTH ONCE DAILY  . aspirin EC 81 MG tablet Take 81 mg by mouth daily.  . clopidogrel (PLAVIX) 75 MG tablet TAKE ONE TABLET BY MOUTH DAILY  . losartan (COZAAR) 100 MG tablet Take 100 mg by mouth daily.  . metoprolol (TOPROL XL) 200 MG 24 hr tablet Take 1 tablet (200 mg total) by mouth daily.  . Omega-3 Fatty Acids (FISH OIL) 1000 MG CAPS Take 1 capsule by mouth daily.  . pantoprazole (PROTONIX) 20 MG tablet Take 20 mg by mouth daily.  . Red Yeast Rice 600 MG CAPS Take 1 capsule by mouth daily.  . traZODone (DESYREL) 50 MG tablet Take 50 mg by mouth at bedtime.  . [DISCONTINUED] nitroGLYCERIN (NITROSTAT) 0.4 MG SL tablet Place 1 tablet (0.4 mg total) under the tongue every 5 (five) minutes as needed for chest pain.     Allergies:   Tramadol   Social History   Socioeconomic History  . Marital status: Married    Spouse name: Not on file  . Number of children: Not on file  . Years of education: Not on file  . Highest education level: Not on file  Occupational History  .  Not on file  Tobacco Use  . Smoking status: Never Smoker  . Smokeless tobacco: Never Used  Vaping Use  . Vaping Use: Never used  Substance and Sexual Activity  . Alcohol use: Yes    Alcohol/week: 0.0 standard drinks    Comment: casual drinking on Saturday   . Drug use: No  . Sexual activity: Yes  Other Topics Concern  . Not on file  Social History Narrative  . Not on file   Social Determinants of Health   Financial Resource Strain: Not on file  Food Insecurity: Not on file  Transportation Needs: Not on file  Physical Activity: Not on file  Stress: Not on  file  Social Connections: Not on file     Family History: The patient's family history includes CVA in an other family member; Cancer (age of onset: 93) in his father; Heart attack (age of onset: 37) in his paternal grandfather; Hypertension in his sister.  ROS:   Please see the history of present illness.    Kd noted on last kidney function tests from 2020 all other systems reviewed and are negative.  EKGs/Labs/Other Studies Reviewed:    The following studies were reviewed today:  Creatinine 1.76 in October 2020.  Most recent LDL cholesterol was 136  EKG:  EKG normal sinus rhythm, possible LVH with strain, first-degree AV block, inferolateral T wave inversion.  Recent Labs: No results found for requested labs within last 8760 hours.  Recent Lipid Panel    Component Value Date/Time   CHOL 251 (H) 04/08/2015 0545   TRIG 124 04/08/2015 0545   HDL 23 (L) 04/08/2015 0545   CHOLHDL 10.9 04/08/2015 0545   VLDL 25 04/08/2015 0545   LDLCALC 203 (H) 04/08/2015 0545    Physical Exam:    VS:  BP 140/90   Pulse 62   Ht 6' (1.829 m)   Wt 218 lb 12.8 oz (99.2 kg)   SpO2 96%   BMI 29.67 kg/m     Wt Readings from Last 3 Encounters:  05/04/20 218 lb 12.8 oz (99.2 kg)  04/29/20 227 lb (103 kg)  06/16/19 227 lb (103 kg)     GEN: Healthy-appearing. No acute distress HEENT: Normal NECK: No JVD. LYMPHATICS: No lymphadenopathy CARDIAC: Soft 1/6 systolic along left mid sternal border.  RRR without diastolic murmur, gallop, or edema. VASCULAR:  Normal Pulses. No bruits. RESPIRATORY:  Clear to auscultation without rales, wheezing or rhonchi  ABDOMEN: Soft, non-tender, non-distended, No pulsatile mass, MUSCULOSKELETAL: No deformity  SKIN: Warm and dry NEUROLOGIC:  Alert and oriented x 3 PSYCHIATRIC:  Normal affect   ASSESSMENT:    1. Coronary artery disease involving native coronary artery of native heart with unstable angina pectoris ([place])   2. Hypertension, essential   3.  Hyperlipidemia LDL goal <70   4. Chronic combined systolic and diastolic heart failure, NYHA class 2 ([place])   5. Educated about COVID-19 virus infection   6. Malignant hypertensive heart and CKD (chronic kidney disease) stage IV (HCC)    PLAN:    In order of problems listed above:  1. Recurrent angina, abnormal nuclear study with a large region of inferior under perfusion.  Up titration of medication has not resolved the discomfort.  Current lifestyle is not acceptable.  Plan to do diagnostic coronary angiography, define anatomy, and intervene if at all possible.  A poor job of risk factor modification is occurring.  He complains of intolerance of statins.  He is on red yeast  rice.  Blood pressure medications are significant including max dose Cozaar, 200 mg daily of Toprol-XL, and Norvasc 10 mg/day.  His blood pressure is still not acceptable with both systolic and diastolic 10 points higher than target. 2. We will need to intensify therapy after cath. 3. LDL target should be less than 70 and ideally less than 55 given risk. 4. May need to switch to guideline directed therapy for systolic dysfunction if we find progression of LV dysfunction. 5. He will need hydration due to CKD stage IV. 6. He is vaccinated and practicing social distancing.  The patient was counseled to undergo left heart catheterization, coronary angiography, and possible percutaneous coronary intervention with stent implantation. The procedural risks and benefits were discussed in detail. The risks discussed included death, stroke, myocardial infarction, life-threatening bleeding, limb ischemia, kidney injury, allergy, and possible emergency cardiac surgery. The risk of these significant complications were estimated to occur less than 1% of the time. After discussion, the patient has agreed to proceed.  Overall education and awareness concerning primary/secondary risk prevention was discussed in detail: LDL less than 70,  hemoglobin A1c less than 7, blood pressure target less than 130/80 mmHg, >150 minutes of moderate aerobic activity per week, avoidance of smoking, weight control (via diet and exercise), and continued surveillance/management of/for obstructive sleep apnea.     Medication Adjustments/Labs and Tests Ordered: Current medicines are reviewed at length with the patient today.  Concerns regarding medicines are outlined above.  Orders Placed This Encounter  Procedures  . Basic metabolic panel  . CBC  . EKG 12-Lead   Meds ordered this encounter  Medications  . nitroGLYCERIN (NITROSTAT) 0.4 MG SL tablet    Sig: Place 1 tablet (0.4 mg total) under the tongue every 5 (five) minutes as needed for chest pain.    Dispense:  25 tablet    Refill:  4    Patient Instructions  Medication Instructions:  Your physician recommends that you continue on your current medications as directed. Please refer to the Current Medication list given to you today.  *If you need a refill on your cardiac medications before your next appointment, please call your pharmacy*   Lab Work: BMET and CBC today  If you have labs (blood work) drawn today and your tests are completely normal, you will receive your results only by: Marland Kitchen MyChart Message (if you have MyChart) OR . A paper copy in the mail If you have any lab test that is abnormal or we need to change your treatment, we will call you to review the results.   Testing/Procedures: Your physician has requested that you have a cardiac catheterization. Cardiac catheterization is used to diagnose and/or treat various heart conditions. Doctors may recommend this procedure for a number of different reasons. The most common reason is to evaluate chest pain. Chest pain can be a symptom of coronary artery disease (CAD), and cardiac catheterization can show whether plaque is narrowing or blocking your heart's arteries. This procedure is also used to evaluate the valves, as well  as measure the blood flow and oxygen levels in different parts of your heart. For further information please visit [email or web address]. Please follow instruction sheet, as given.    Follow-Up: At [facility], you and your health needs are our priority.  As part of our continuing mission to provide you with exceptional heart care, we have created designated Provider Care Teams.  These Care Teams include your primary Cardiologist (physician) and Advanced Practice Providers (APPs -  Physician Assistants and Nurse Practitioners) who all work together to provide you with the care you need, when you need it.  We recommend signing up for the patient portal called "MyChart".  Sign up information is provided on this After Visit Summary.  MyChart is used to connect with patients for Virtual Visits (Telemedicine).  Patients are able to view lab/test results, encounter notes, upcoming appointments, etc.  Non-urgent messages can be sent to your provider as well.   To learn more about what you can do with MyChart, go to [email or web address].    Your next appointment:   2-3 week(s) after cath  The format for your next appointment:   In Person  Provider:   You may see Sinclair Grooms, MD or one of the following Advanced Practice Providers on your designated Care Team:    Truitt Merle, NP  Cecilie Kicks, NP  Kathyrn Drown, NP    Other Instructions  Due to recent COVID-19 restrictions implemented by our local and state authorities and in an effort to keep both patients and staff as safe as possible, our hospital system requires COVID-19 testing prior to certain scheduled hospital procedures.  Please go to [address] [place], [place] [address] on Wednesday, May 05, 2020 at 1:45pm  .  This is a drive up testing site.  You will not need to exit your vehicle.  You must agree to self-quarantine from the time of your testing until the procedure date on Friday, May 07, 2020.  This should  included staying home with ONLY the people you live with.  Avoid take-out, grocery store shopping or leaving the house for any non-emergent reason.  Failure to have your COVID-19 test done on the date and time you have been scheduled will result in cancellation of your procedure.  Please call our office at [phone] if you have any questions.    [facility] OFFICE [place], [place] [address] Dept: [phone] Loc: [phone]  David Chang  05/04/2020  You are scheduled for a Cardiac Catheterization on Friday, December 17 with Dr. Daneen Schick.  1. Please arrive at the [facility] (Main Entrance A) at [facility]: [address] [place], [place] [address] at 5:30 AM (This time is two hours before your procedure to ensure your preparation). Free valet parking service is available.   Special note: Every effort is made to have your procedure done on time. Please understand that emergencies sometimes delay scheduled procedures.  2. Diet: Do not eat solid foods after midnight.  The patient may have clear liquids until 5am upon the day of the procedure.  3. Labs: You will have labs drawn today.  4. Medication instructions in preparation for your procedure:   Contrast Allergy: No  You will need to hold your Losartan the day before and the morning of your procedure.  On the morning of your procedure, take your Aspirin and any morning medicines NOT listed above.  You may use sips of water.  5. Plan for one night stay--bring personal belongings. 6. Bring a current list of your medications and current insurance cards. 7. You MUST have a responsible person to drive you home. 8. Someone MUST be with you the first 24 hours after you arrive home or your discharge will be delayed. 9. Please wear clothes that are easy to get on and off and wear slip-on shoes.  Thank you for allowing  [place]  to care for you!   -- [facility]      Signed, Sinclair Grooms, MD  05/04/2020 1:54 PM    [place]

## 2020-05-02 NOTE — Progress Notes (Signed)
Cardiology Office Note:    Date:  05/04/2020   ID:  David Chang, DOB 1960/07/26, MRN [id number]  PCP:  Ernestene Kiel, MD  Cardiologist:  Sinclair Grooms, MD   Referring MD: Ernestene Kiel, MD   Chief Complaint  Patient presents with  . Coronary Artery Disease  . Hyperlipidemia  . Hypertension  . Chronic Kidney Disease    History of Present Illness:    David Chang is a 59 y.o. male with a hx of CAD with multivessel DES including RCA, circumflex, and LAD all in 2016. Hyperlipidemia not currently treated with statin therapy due to musculoskeletal syndrome, hypertension, and ischemic left ventricular dysfunction(EF 45-50%).  Less than 4-week history of exertional central chest pressure.  Discomfort occurs when heart rate gets above 125 bpm.  It has not been improved by uptitrating beta-blocker therapy.  Recent nuclear scintigraphy demonstrated inferior perfusion abnormality with partial redistribution suggesting ischemia.  Since that abnormal stress test, he has not exercise being on a threshold which induces discomfort.  He has not had any discomfort at rest.  Past Medical History:  Diagnosis Date  . Arthritis   . Coronary artery disease   . Hyperlipidemia LDL goal <70 04/10/2015  . Hypertension   . Ischemic cardiomyopathy 04/10/2015  . S/P angioplasty with stent to mLAD DES, pRCA DES, mLCX DES 04/08/15  04/10/2015    Past Surgical History:  Procedure Laterality Date  . ACHILLES TENDON SURGERY Left 12/06/2017   Procedure: Complete Left Achilles tendon repair; dermaspan graf, application of cast;  Surgeon: Latanya Maudlin, MD;  Location: [facility];  Service: Orthopedics;  Laterality: Left;  . achillies repair     and medial gastrocnemius tear repair  . CARDIAC CATHETERIZATION N/A 04/08/2015   Procedure: Left Heart Cath and Coronary Angiography;  Surgeon: Leonie Man, MD;  Location: [facility];  Service: Cardiovascular;  Laterality: N/A;  . KNEE  SURGERY Left   . left tricep surgery      Right tricep repair also  . TOTAL KNEE ARTHROPLASTY Left 04/24/2016   Procedure: LEFT TOTAL KNEE ARTHROPLASTY;  Surgeon: Gaynelle Arabian, MD;  Location: [facility];  Service: Orthopedics;  Laterality: Left;    Current Medications: Current Meds  Medication Sig  . amLODipine (NORVASC) 10 MG tablet TAKE 1 TABLET BY MOUTH ONCE DAILY  . aspirin EC 81 MG tablet Take 81 mg by mouth daily.  . clopidogrel (PLAVIX) 75 MG tablet TAKE ONE TABLET BY MOUTH DAILY  . losartan (COZAAR) 100 MG tablet Take 100 mg by mouth daily.  . metoprolol (TOPROL XL) 200 MG 24 hr tablet Take 1 tablet (200 mg total) by mouth daily.  . Omega-3 Fatty Acids (FISH OIL) 1000 MG CAPS Take 1 capsule by mouth daily.  . pantoprazole (PROTONIX) 20 MG tablet Take 20 mg by mouth daily.  . Red Yeast Rice 600 MG CAPS Take 1 capsule by mouth daily.  . traZODone (DESYREL) 50 MG tablet Take 50 mg by mouth at bedtime.  . [DISCONTINUED] nitroGLYCERIN (NITROSTAT) 0.4 MG SL tablet Place 1 tablet (0.4 mg total) under the tongue every 5 (five) minutes as needed for chest pain.     Allergies:   Tramadol   Social History   Socioeconomic History  . Marital status: Married    Spouse name: Not on file  . Number of children: Not on file  . Years of education: Not on file  . Highest education level: Not on file  Occupational History  .  Not on file  Tobacco Use  . Smoking status: Never Smoker  . Smokeless tobacco: Never Used  Vaping Use  . Vaping Use: Never used  Substance and Sexual Activity  . Alcohol use: Yes    Alcohol/week: 0.0 standard drinks    Comment: casual drinking on Saturday   . Drug use: No  . Sexual activity: Yes  Other Topics Concern  . Not on file  Social History Narrative  . Not on file   Social Determinants of Health   Financial Resource Strain: Not on file  Food Insecurity: Not on file  Transportation Needs: Not on file  Physical Activity: Not on file  Stress: Not on  file  Social Connections: Not on file     Family History: The patient's family history includes CVA in an other family member; Cancer (age of onset: 59) in his father; Heart attack (age of onset: 63) in his paternal grandfather; Hypertension in his sister.  ROS:   Please see the history of present illness.    Kd noted on last kidney function tests from 2020 all other systems reviewed and are negative.  EKGs/Labs/Other Studies Reviewed:    The following studies were reviewed today:  Creatinine 1.76 in October 2020.  Most recent LDL cholesterol was 136  EKG:  EKG normal sinus rhythm, possible LVH with strain, first-degree AV block, inferolateral T wave inversion.  Recent Labs: No results found for requested labs within last 8760 hours.  Recent Lipid Panel    Component Value Date/Time   CHOL 251 (H) 04/08/2015 0545   TRIG 124 04/08/2015 0545   HDL 23 (L) 04/08/2015 0545   CHOLHDL 10.9 04/08/2015 0545   VLDL 25 04/08/2015 0545   LDLCALC 203 (H) 04/08/2015 0545    Physical Exam:    VS:  BP 140/90   Pulse 62   Ht 6' (1.829 m)   Wt 218 lb 12.8 oz (99.2 kg)   SpO2 96%   BMI 29.67 kg/m     Wt Readings from Last 3 Encounters:  05/04/20 218 lb 12.8 oz (99.2 kg)  04/29/20 227 lb (103 kg)  06/16/19 227 lb (103 kg)     GEN: Healthy-appearing. No acute distress HEENT: Normal NECK: No JVD. LYMPHATICS: No lymphadenopathy CARDIAC: Soft 1/6 systolic along left mid sternal border.  RRR without diastolic murmur, gallop, or edema. VASCULAR:  Normal Pulses. No bruits. RESPIRATORY:  Clear to auscultation without rales, wheezing or rhonchi  ABDOMEN: Soft, non-tender, non-distended, No pulsatile mass, MUSCULOSKELETAL: No deformity  SKIN: Warm and dry NEUROLOGIC:  Alert and oriented x 3 PSYCHIATRIC:  Normal affect   ASSESSMENT:    1. Coronary artery disease involving native coronary artery of native heart with unstable angina pectoris ([place])   2. Hypertension, essential   3.  Hyperlipidemia LDL goal <70   4. Chronic combined systolic and diastolic heart failure, NYHA class 2 ([place])   5. Educated about COVID-19 virus infection   6. Malignant hypertensive heart and CKD (chronic kidney disease) stage IV ([facility])    PLAN:    In order of problems listed above:  1. Recurrent angina, abnormal nuclear study with a large region of inferior under perfusion.  Up titration of medication has not resolved the discomfort.  Current lifestyle is not acceptable.  Plan to do diagnostic coronary angiography, define anatomy, and intervene if at all possible.  A poor job of risk factor modification is occurring.  He complains of intolerance of statins.  He is on red yeast  rice.  Blood pressure medications are significant including max dose Cozaar, 200 mg daily of Toprol-XL, and Norvasc 10 mg/day.  His blood pressure is still not acceptable with both systolic and diastolic 10 points higher than target. 2. We will need to intensify therapy after cath. 3. LDL target should be less than 70 and ideally less than 55 given risk. 4. May need to switch to guideline directed therapy for systolic dysfunction if we find progression of LV dysfunction. 5. He will need hydration due to CKD stage IV. 6. He is vaccinated and practicing social distancing.  The patient was counseled to undergo left heart catheterization, coronary angiography, and possible percutaneous coronary intervention with stent implantation. The procedural risks and benefits were discussed in detail. The risks discussed included death, stroke, myocardial infarction, life-threatening bleeding, limb ischemia, kidney injury, allergy, and possible emergency cardiac surgery. The risk of these significant complications were estimated to occur less than 1% of the time. After discussion, the patient has agreed to proceed.  Overall education and awareness concerning primary/secondary risk prevention was discussed in detail: LDL less than 70,  hemoglobin A1c less than 7, blood pressure target less than 130/80 mmHg, >150 minutes of moderate aerobic activity per week, avoidance of smoking, weight control (via diet and exercise), and continued surveillance/management of/for obstructive sleep apnea.     Medication Adjustments/Labs and Tests Ordered: Current medicines are reviewed at length with the patient today.  Concerns regarding medicines are outlined above.  Orders Placed This Encounter  Procedures  . Basic metabolic panel  . CBC  . EKG 12-Lead   Meds ordered this encounter  Medications  . nitroGLYCERIN (NITROSTAT) 0.4 MG SL tablet    Sig: Place 1 tablet (0.4 mg total) under the tongue every 5 (five) minutes as needed for chest pain.    Dispense:  25 tablet    Refill:  4    Patient Instructions  Medication Instructions:  Your physician recommends that you continue on your current medications as directed. Please refer to the Current Medication list given to you today.  *If you need a refill on your cardiac medications before your next appointment, please call your pharmacy*   Lab Work: BMET and CBC today  If you have labs (blood work) drawn today and your tests are completely normal, you will receive your results only by: [facility] MyChart Message (if you have MyChart) OR . A paper copy in the mail If you have any lab test that is abnormal or we need to change your treatment, we will call you to review the results.   Testing/Procedures: Your physician has requested that you have a cardiac catheterization. Cardiac catheterization is used to diagnose and/or treat various heart conditions. Doctors may recommend this procedure for a number of different reasons. The most common reason is to evaluate chest pain. Chest pain can be a symptom of coronary artery disease (CAD), and cardiac catheterization can show whether plaque is narrowing or blocking your heart's arteries. This procedure is also used to evaluate the valves, as well  as measure the blood flow and oxygen levels in different parts of your heart. For further information please visit [email or web address].[email or web address]. Please follow instruction sheet, as given.    Follow-Up: At [facility], you and your health needs are our priority.  As part of our continuing mission to provide you with exceptional heart care, we have created designated Provider Care Teams.  These Care Teams include your primary Cardiologist (physician) and Advanced Practice Providers (APPs -  Physician Assistants and Nurse Practitioners) who all work together to provide you with the care you need, when you need it.  We recommend signing up for the patient portal called "MyChart".  Sign up information is provided on this After Visit Summary.  MyChart is used to connect with patients for Virtual Visits (Telemedicine).  Patients are able to view lab/test results, encounter notes, upcoming appointments, etc.  Non-urgent messages can be sent to your provider as well.   To learn more about what you can do with MyChart, go to [email or web address].    Your next appointment:   2-3 week(s) after cath  The format for your next appointment:   In Person  Provider:   You may see Sinclair Grooms, MD or one of the following Advanced Practice Providers on your designated Care Team:    Truitt Merle, NP  Cecilie Kicks, NP  Kathyrn Drown, NP    Other Instructions  Due to recent COVID-19 restrictions implemented by our local and state authorities and in an effort to keep both patients and staff as safe as possible, our hospital system requires COVID-19 testing prior to certain scheduled hospital procedures.  Please go to [place]. [place], [place] [address] on Wednesday, May 05, 2020 at 1:45pm  .  This is a drive up testing site.  You will not need to exit your vehicle.  You must agree to self-quarantine from the time of your testing until the procedure date on Friday, May 07, 2020.  This should  included staying home with ONLY the people you live with.  Avoid take-out, grocery store shopping or leaving the house for any non-emergent reason.  Failure to have your COVID-19 test done on the date and time you have been scheduled will result in cancellation of your procedure.  Please call our office at [phone] if you have any questions.    [facility] [place], [place] [address] Dept: [phone] Loc: [phone]  LEANDRE WIEN  05/04/2020  You are scheduled for a Cardiac Catheterization on Friday, December 17 with Dr. Daneen Schick.  1. Please arrive at the [facility] (Main Entrance A) at [facility]: [address] [place], [place] [address] at 5:30 AM (This time is two hours before your procedure to ensure your preparation). Free valet parking service is available.   Special note: Every effort is made to have your procedure done on time. Please understand that emergencies sometimes delay scheduled procedures.  2. Diet: Do not eat solid foods after midnight.  The patient may have clear liquids until 5am upon the day of the procedure.  3. Labs: You will have labs drawn today.  4. Medication instructions in preparation for your procedure:   Contrast Allergy: No  You will need to hold your Losartan the day before and the morning of your procedure.  On the morning of your procedure, take your Aspirin and any morning medicines NOT listed above.  You may use sips of water.  5. Plan for one night stay--bring personal belongings. 6. Bring a current list of your medications and current insurance cards. 7. You MUST have a responsible person to drive you home. 8. Someone MUST be with you the first 24 hours after you arrive home or your discharge will be delayed. 9. Please wear clothes that are easy to get on and off and wear slip-on shoes.  Thank you for allowing  [place]  to care for you!   -- [place] [facility]      Signed, Sinclair Grooms, MD  05/04/2020 1:54 PM    [place]

## 2020-05-04 ENCOUNTER — Other Ambulatory Visit: Payer: Self-pay

## 2020-05-04 ENCOUNTER — Encounter: Payer: Self-pay | Admitting: Interventional Cardiology

## 2020-05-04 ENCOUNTER — Ambulatory Visit: Payer: BLUE CROSS/BLUE SHIELD | Admitting: Interventional Cardiology

## 2020-05-04 VITALS — BP 140/90 | HR 62 | Ht 72.0 in | Wt 218.8 lb

## 2020-05-04 DIAGNOSIS — I5042 Chronic combined systolic (congestive) and diastolic (congestive) heart failure: Secondary | ICD-10-CM | POA: Diagnosis not present

## 2020-05-04 DIAGNOSIS — Z7189 Other specified counseling: Secondary | ICD-10-CM

## 2020-05-04 DIAGNOSIS — E785 Hyperlipidemia, unspecified: Secondary | ICD-10-CM | POA: Diagnosis not present

## 2020-05-04 DIAGNOSIS — I25110 Atherosclerotic heart disease of native coronary artery with unstable angina pectoris: Secondary | ICD-10-CM

## 2020-05-04 DIAGNOSIS — I1310 Hypertensive heart and chronic kidney disease without heart failure, with stage 1 through stage 4 chronic kidney disease, or unspecified chronic kidney disease: Secondary | ICD-10-CM

## 2020-05-04 DIAGNOSIS — I10 Essential (primary) hypertension: Secondary | ICD-10-CM

## 2020-05-04 DIAGNOSIS — N184 Chronic kidney disease, stage 4 (severe): Secondary | ICD-10-CM

## 2020-05-04 MED ORDER — NITROGLYCERIN 0.4 MG SL SUBL: 0.4000 mg | SUBLINGUAL_TABLET | SUBLINGUAL | 4 refills | Status: AC | PRN

## 2020-05-04 NOTE — Patient Instructions (Addendum)
Medication Instructions:  Your physician recommends that you continue on your current medications as directed. Please refer to the Current Medication list given to you today.  *If you need a refill on your cardiac medications before your next appointment, please call your pharmacy*   Lab Work: BMET and CBC today  If you have labs (blood work) drawn today and your tests are completely normal, you will receive your results only by:  [place] (if you have MyChart) OR  A paper copy in the mail If you have any lab test that is abnormal or we need to change your treatment, we will call you to review the results.   Testing/Procedures: Your physician has requested that you have a cardiac catheterization. Cardiac catheterization is used to diagnose and/or treat various heart conditions. Doctors may recommend this procedure for a number of different reasons. The most common reason is to evaluate chest pain. Chest pain can be a symptom of coronary artery disease (CAD), and cardiac catheterization can show whether plaque is narrowing or blocking your hearts arteries. This procedure is also used to evaluate the valves, as well as measure the blood flow and oxygen levels in different parts of your heart. For further information please visit [email or web address].[email or web address]. Please follow instruction sheet, as given.    Follow-Up: At [facility], you and your health needs are our priority.  As part of our continuing mission to provide you with exceptional heart care, we have created designated Provider Care Teams.  These Care Teams include your primary Cardiologist (physician) and Advanced Practice Providers (APPs -  Physician Assistants and Nurse Practitioners) who all work together to provide you with the care you need, when you need it.  We recommend signing up for the patient portal called "MyChart".  Sign up information is provided on this After Visit Summary.  MyChart is used to connect with patients  for Virtual Visits (Telemedicine).  Patients are able to view lab/test results, encounter notes, upcoming appointments, etc.  Non-urgent messages can be sent to your provider as well.   To learn more about what you can do with MyChart, go to [email or web address].    Your next appointment:   2-3 week(s) after cath  The format for your next appointment:   In Person  Provider:   You may see Sinclair Grooms, MD or one of the following Advanced Practice Providers on your designated Care Team:    Truitt Merle, NP  Cecilie Kicks, NP  Kathyrn Drown, NP    Other Instructions  Due to recent COVID-19 restrictions implemented by our local and state authorities and in an effort to keep both patients and staff as safe as possible, our hospital system requires COVID-19 testing prior to certain scheduled hospital procedures.  Please go to [address]. [place], [place] [address] on Wednesday, May 05, 2020 at 1:45pm  .  This is a drive up testing site.  You will not need to exit your vehicle.  You must agree to self-quarantine from the time of your testing until the procedure date on Friday, May 07, 2020.  This should included staying home with ONLY the people you live with.  Avoid take-out, grocery store shopping or leaving the house for any non-emergent reason.  Failure to have your COVID-19 test done on the date and time you have been scheduled will result in cancellation of your procedure.  Please call our office at [phone] if you have any questions.    [facility]  [facility] OFFICE [place], [address] [place] [address] Dept: [phone] Loc: [phone]  MAANAV KASSABIAN  05/04/2020  You are scheduled for a Cardiac Catheterization on Friday, December 17 with Dr. Daneen Schick.  1. Please arrive at the [facility] (Main Entrance A) at [facility]: [address] [place], [place]  [address] at 5:30 AM (This time is two hours before your procedure to ensure your preparation). Free valet parking service is available.   Special note: Every effort is made to have your procedure done on time. Please understand that emergencies sometimes delay scheduled procedures.  2. Diet: Do not eat solid foods after midnight.  The patient may have clear liquids until 5am upon the day of the procedure.  3. Labs: You will have labs drawn today.  4. Medication instructions in preparation for your procedure:   Contrast Allergy: No  You will need to hold your Losartan the day before and the morning of your procedure.  On the morning of your procedure, take your Aspirin and any morning medicines NOT listed above.  You may use sips of water.  5. Plan for one night stay--bring personal belongings. 6. Bring a current list of your medications and current insurance cards. 7. You MUST have a responsible person to drive you home. 8. Someone MUST be with you the first 24 hours after you arrive home or your discharge will be delayed. 9. Please wear clothes that are easy to get on and off and wear slip-on shoes.  Thank you for allowing [place] to care for you!   -- [place] [facility]

## 2020-05-05 ENCOUNTER — Other Ambulatory Visit (HOSPITAL_COMMUNITY)
Admission: RE | Admit: 2020-05-05 | Discharge: 2020-05-05 | Disposition: A | Discharge status: Home / Self Care | Payer: BLUE CROSS/BLUE SHIELD | Source: Ambulatory Visit | Attending: Interventional Cardiology | Admitting: Interventional Cardiology

## 2020-05-05 DIAGNOSIS — Z20822 Contact with and (suspected) exposure to covid-19: Secondary | ICD-10-CM | POA: Insufficient documentation

## 2020-05-05 DIAGNOSIS — E785 Hyperlipidemia, unspecified: Secondary | ICD-10-CM | POA: Diagnosis not present

## 2020-05-05 DIAGNOSIS — I5042 Chronic combined systolic (congestive) and diastolic (congestive) heart failure: Secondary | ICD-10-CM | POA: Diagnosis not present

## 2020-05-05 DIAGNOSIS — Z79899 Other long term (current) drug therapy: Secondary | ICD-10-CM | POA: Diagnosis not present

## 2020-05-05 DIAGNOSIS — Z955 Presence of coronary angioplasty implant and graft: Secondary | ICD-10-CM | POA: Diagnosis not present

## 2020-05-05 DIAGNOSIS — I2582 Chronic total occlusion of coronary artery: Secondary | ICD-10-CM | POA: Diagnosis not present

## 2020-05-05 DIAGNOSIS — I25118 Atherosclerotic heart disease of native coronary artery with other forms of angina pectoris: Secondary | ICD-10-CM | POA: Diagnosis not present

## 2020-05-05 DIAGNOSIS — Z01812 Encounter for preprocedural laboratory examination: Secondary | ICD-10-CM | POA: Insufficient documentation

## 2020-05-05 DIAGNOSIS — Z7902 Long term (current) use of antithrombotics/antiplatelets: Secondary | ICD-10-CM | POA: Diagnosis not present

## 2020-05-05 DIAGNOSIS — I130 Hypertensive heart and chronic kidney disease with heart failure and stage 1 through stage 4 chronic kidney disease, or unspecified chronic kidney disease: Secondary | ICD-10-CM | POA: Diagnosis not present

## 2020-05-05 DIAGNOSIS — Z7982 Long term (current) use of aspirin: Secondary | ICD-10-CM | POA: Diagnosis not present

## 2020-05-05 DIAGNOSIS — N189 Chronic kidney disease, unspecified: Secondary | ICD-10-CM | POA: Diagnosis not present

## 2020-05-05 LAB — BASIC METABOLIC PANEL
BUN/Creatinine Ratio: 23 (ref 10–24)
BUN: 35 mg/dL — ABNORMAL HIGH (ref 8–27)
CO2: 21 mmol/L (ref 20–29)
Calcium: 9.5 mg/dL (ref 8.6–10.2)
Chloride: 102 mmol/L (ref 96–106)
Creatinine, Ser: 1.55 mg/dL — ABNORMAL HIGH (ref 0.76–1.27)
GFR calc Af Amer: 55 mL/min/{1.73_m2} — ABNORMAL LOW (ref >59)
GFR calc non Af Amer: 48 mL/min/{1.73_m2} — ABNORMAL LOW (ref >59)
Glucose: 95 mg/dL (ref 65–99)
Potassium: 5.1 mmol/L (ref 3.5–5.2)
Sodium: 137 mmol/L (ref 134–144)

## 2020-05-05 LAB — CBC
Hematocrit: 48.5 % (ref 37.5–51.0)
Hemoglobin: 16.5 g/dL (ref 13.0–17.7)
MCH: 31.7 pg (ref 26.6–33.0)
MCHC: 34 g/dL (ref 31.5–35.7)
MCV: 93 fL (ref 79–97)
Platelets: 166 10*3/uL (ref 150–450)
RBC: 5.21 x10E6/uL (ref 4.14–5.80)
RDW: 12.3 % (ref 11.6–15.4)
WBC: 7.2 10*3/uL (ref 3.4–10.8)

## 2020-05-05 LAB — SARS CORONAVIRUS 2 (TAT 6-24 HRS): SARS Coronavirus 2: NEGATIVE

## 2020-05-06 ENCOUNTER — Telehealth: Payer: Self-pay | Admitting: *Deleted

## 2020-05-06 NOTE — Telephone Encounter (Signed)
Pt contacted pre-catheterization scheduled at [facility] for: Friday May 07, 2020 10:30 AM Verified arrival time and place: [facility]) at: 5:30 AM-pre procedure hydration   No solid food after midnight prior to cath, clear liquids until 5 AM day of procedure.  Hold: Losartan-day before and day of procedure-GFR 48  Except hold medications AM meds can be  taken pre-cath with sips of water including: ASA 81 mg Plavix 75 mg  Confirmed patient has responsible adult to drive home post procedure and be with patient first 24 hours after arriving home: yes  You are allowed ONE visitor in the waiting room during the time you are at the hospital for your procedure. Both you and your visitor must wear a mask once you enter the hospital.       COVID-19 Pre-Screening Questions:  . In the past 14 days have you had any symptoms concerning for COVID-19 infection (fever, chills, cough, or new shortness of breath)? no . In the past 14 days have you been around anyone with known Covid 19? No   Reviewed procedure/mask/visitor instructions, COVID-19 questions with patient.

## 2020-05-07 ENCOUNTER — Ambulatory Visit (HOSPITAL_COMMUNITY)
Admission: RE | Admit: 2020-05-07 | Discharge: 2020-05-07 | Disposition: A | Discharge status: Home / Self Care | Payer: BLUE CROSS/BLUE SHIELD | Attending: Interventional Cardiology | Admitting: Interventional Cardiology

## 2020-05-07 ENCOUNTER — Encounter (HOSPITAL_COMMUNITY)
Admission: RE | Disposition: A | Discharge status: Home / Self Care | Payer: Self-pay | Source: Home / Self Care | Attending: Interventional Cardiology

## 2020-05-07 DIAGNOSIS — I5042 Chronic combined systolic (congestive) and diastolic (congestive) heart failure: Secondary | ICD-10-CM | POA: Diagnosis not present

## 2020-05-07 DIAGNOSIS — Z955 Presence of coronary angioplasty implant and graft: Secondary | ICD-10-CM | POA: Diagnosis not present

## 2020-05-07 DIAGNOSIS — I25118 Atherosclerotic heart disease of native coronary artery with other forms of angina pectoris: Secondary | ICD-10-CM | POA: Diagnosis not present

## 2020-05-07 DIAGNOSIS — Z7902 Long term (current) use of antithrombotics/antiplatelets: Secondary | ICD-10-CM | POA: Insufficient documentation

## 2020-05-07 DIAGNOSIS — I2510 Atherosclerotic heart disease of native coronary artery without angina pectoris: Secondary | ICD-10-CM | POA: Diagnosis present

## 2020-05-07 DIAGNOSIS — Z20822 Contact with and (suspected) exposure to covid-19: Secondary | ICD-10-CM | POA: Diagnosis not present

## 2020-05-07 DIAGNOSIS — I130 Hypertensive heart and chronic kidney disease with heart failure and stage 1 through stage 4 chronic kidney disease, or unspecified chronic kidney disease: Secondary | ICD-10-CM | POA: Diagnosis not present

## 2020-05-07 DIAGNOSIS — I2582 Chronic total occlusion of coronary artery: Secondary | ICD-10-CM | POA: Diagnosis not present

## 2020-05-07 DIAGNOSIS — R931 Abnormal findings on diagnostic imaging of heart and coronary circulation: Secondary | ICD-10-CM

## 2020-05-07 DIAGNOSIS — Z95820 Peripheral vascular angioplasty status with implants and grafts: Secondary | ICD-10-CM

## 2020-05-07 DIAGNOSIS — E785 Hyperlipidemia, unspecified: Secondary | ICD-10-CM | POA: Diagnosis not present

## 2020-05-07 DIAGNOSIS — Z79899 Other long term (current) drug therapy: Secondary | ICD-10-CM | POA: Insufficient documentation

## 2020-05-07 DIAGNOSIS — N189 Chronic kidney disease, unspecified: Secondary | ICD-10-CM | POA: Insufficient documentation

## 2020-05-07 DIAGNOSIS — Z7982 Long term (current) use of aspirin: Secondary | ICD-10-CM | POA: Insufficient documentation

## 2020-05-07 DIAGNOSIS — I25110 Atherosclerotic heart disease of native coronary artery with unstable angina pectoris: Secondary | ICD-10-CM | POA: Diagnosis present

## 2020-05-07 DIAGNOSIS — I209 Angina pectoris, unspecified: Secondary | ICD-10-CM

## 2020-05-07 DIAGNOSIS — I10 Essential (primary) hypertension: Secondary | ICD-10-CM | POA: Diagnosis present

## 2020-05-07 HISTORY — PX: LEFT HEART CATH AND CORONARY ANGIOGRAPHY: CATH118249

## 2020-05-07 SURGERY — LEFT HEART CATH AND CORONARY ANGIOGRAPHY
Anesthesia: LOCAL

## 2020-05-07 MED ORDER — HEPARIN (PORCINE) IN NACL 1000-0.9 UT/500ML-% IV SOLN
INTRAVENOUS | Status: AC
  Filled 2020-05-07: qty 1000

## 2020-05-07 MED ORDER — MIDAZOLAM HCL 2 MG/2ML IJ SOLN
INTRAMUSCULAR | Status: DC | PRN
  Administered 2020-05-07: 1 mg via INTRAVENOUS

## 2020-05-07 MED ORDER — FENTANYL CITRATE (PF) 100 MCG/2ML IJ SOLN
INTRAMUSCULAR | Status: DC | PRN
  Administered 2020-05-07: 25 ug via INTRAVENOUS

## 2020-05-07 MED ORDER — MIDAZOLAM HCL 2 MG/2ML IJ SOLN
INTRAMUSCULAR | Status: AC
  Filled 2020-05-07: qty 2

## 2020-05-07 MED ORDER — FENTANYL CITRATE (PF) 100 MCG/2ML IJ SOLN
INTRAMUSCULAR | Status: DC | PRN
  Administered 2020-05-07: 50 ug via INTRAVENOUS

## 2020-05-07 MED ORDER — SODIUM CHLORIDE 0.9 % WEIGHT BASED INFUSION: 1.0000 mL/kg/h | INTRAVENOUS | Status: DC

## 2020-05-07 MED ORDER — LIDOCAINE HCL (PF) 1 % IJ SOLN
INTRAMUSCULAR | Status: AC
  Filled 2020-05-07: qty 30

## 2020-05-07 MED ORDER — HEPARIN SODIUM (PORCINE) 1000 UNIT/ML IJ SOLN
INTRAMUSCULAR | Status: DC | PRN
  Administered 2020-05-07 (×2): 5000 [IU] via INTRAVENOUS

## 2020-05-07 MED ORDER — FENTANYL CITRATE (PF) 100 MCG/2ML IJ SOLN
INTRAMUSCULAR | Status: AC
  Filled 2020-05-07: qty 2

## 2020-05-07 MED ORDER — IOHEXOL 350 MG/ML SOLN
INTRAVENOUS | Status: DC | PRN
  Administered 2020-05-07: 70 mL via INTRA_ARTERIAL

## 2020-05-07 MED ORDER — LIDOCAINE HCL (PF) 1 % IJ SOLN
INTRAMUSCULAR | Status: DC | PRN
  Administered 2020-05-07: 2 mL

## 2020-05-07 MED ORDER — ONDANSETRON HCL 4 MG/2ML IJ SOLN: 4.0000 mg | Freq: Four times a day (QID) | INTRAMUSCULAR | Status: DC | PRN

## 2020-05-07 MED ORDER — VERAPAMIL HCL 2.5 MG/ML IV SOLN
INTRAVENOUS | Status: AC
  Filled 2020-05-07: qty 2

## 2020-05-07 MED ORDER — SODIUM CHLORIDE 0.9% FLUSH: 3.0000 mL | INTRAVENOUS | Status: DC | PRN

## 2020-05-07 MED ORDER — HEPARIN (PORCINE) IN NACL 1000-0.9 UT/500ML-% IV SOLN
INTRAVENOUS | Status: DC | PRN
  Administered 2020-05-07 (×2): 500 mL

## 2020-05-07 MED ORDER — ASPIRIN 81 MG PO CHEW: 81.0000 mg | CHEWABLE_TABLET | ORAL | Status: DC

## 2020-05-07 MED ORDER — ACETAMINOPHEN 325 MG PO TABS: 650.0000 mg | ORAL_TABLET | ORAL | Status: DC | PRN

## 2020-05-07 MED ORDER — HEPARIN SODIUM (PORCINE) 1000 UNIT/ML IJ SOLN
INTRAMUSCULAR | Status: AC
  Filled 2020-05-07: qty 1

## 2020-05-07 MED ORDER — VERAPAMIL HCL 2.5 MG/ML IV SOLN
INTRAVENOUS | Status: DC | PRN
  Administered 2020-05-07: 10 mL via INTRA_ARTERIAL

## 2020-05-07 MED ORDER — SODIUM CHLORIDE 0.9% FLUSH: 3.0000 mL | Freq: Two times a day (BID) | INTRAVENOUS | Status: DC

## 2020-05-07 MED ORDER — SODIUM CHLORIDE 0.9 % IV SOLN: 250.0000 mL | INTRAVENOUS | Status: DC | PRN

## 2020-05-07 MED ORDER — SODIUM CHLORIDE 0.9 % WEIGHT BASED INFUSION
3.0000 mL/kg/h | INTRAVENOUS | Status: AC
  Administered 2020-05-07: 3 mL/kg/h via INTRAVENOUS

## 2020-05-07 MED ORDER — CLOPIDOGREL BISULFATE 75 MG PO TABS: 75.0000 mg | ORAL_TABLET | ORAL | Status: DC

## 2020-05-07 MED ORDER — AMLODIPINE BESYLATE 5 MG PO TABS
10.0000 mg | ORAL_TABLET | Freq: Every day | ORAL | Status: DC
  Administered 2020-05-07: 10 mg via ORAL
  Filled 2020-05-07: qty 2

## 2020-05-07 SURGICAL SUPPLY — 11 items
CATH 5FR JL3.5 JR4 ANG PIG MP (CATHETERS) ×2 IMPLANT
CATH VISTA GUIDE 6FR AL1 (CATHETERS) ×2 IMPLANT
DEVICE RAD COMP TR BAND LRG (VASCULAR PRODUCTS) ×2 IMPLANT
GLIDESHEATH SLEND A-KIT 6F 22G (SHEATH) ×2 IMPLANT
GUIDEWIRE INQWIRE 1.5J.035X260 (WIRE) ×1 IMPLANT
INQWIRE 1.5J .035X260CM (WIRE) ×2
KIT HEART LEFT (KITS) ×2 IMPLANT
PACK CARDIAC CATHETERIZATION (CUSTOM PROCEDURE TRAY) ×2 IMPLANT
TRANSDUCER W/STOPCOCK (MISCELLANEOUS) ×2 IMPLANT
TUBING CIL FLEX 10 FLL-RA (TUBING) ×2 IMPLANT
WIRE ASAHI PROWATER 180CM (WIRE) ×2 IMPLANT

## 2020-05-07 NOTE — CV Procedure (Signed)
   Occluded mid circumflex stent.  Right to left collaterals to OM.  Patent LAD stent.  Patent RCA stent but with long angulated disease beyond the stented segment followed by a relatively focal 85% stenosis.  This matches the ischemia noted on nuclear scintigraphy.  Patent left main.  Attempted PCI from the right radial but could never get coaxial using an Amplatz or other extra-support right coronary guide which will be needed to deliver a stent distally.  Patient has CKD stage III.  Decided to discuss with colleagues, discussed with patient as this would be a higher than normal risk from the standpoint of ischemic complications and kidney stability.  If we decide to proceed with PCI (likely) it would be a stand-alone procedure from the right femoral.

## 2020-05-07 NOTE — Interval H&P Note (Signed)
Cath Lab Visit (complete for each Cath Lab visit)  Clinical Evaluation Leading to the Procedure:   ACS: Yes.    Non-ACS:    Anginal Classification: CCS III  Anti-ischemic medical therapy: Maximal Therapy (2 or more classes of medications)  Non-Invasive Test Results: Intermediate-risk stress test findings: cardiac mortality 1-3%/year  Prior CABG: No previous CABG      History and Physical Interval Note:  05/07/2020 11:30 AM  David Chang  has presented today for surgery, with the diagnosis of abnormal stress test, coronary artery disease.  The various methods of treatment have been discussed with the patient and family. After consideration of risks, benefits and other options for treatment, the patient has consented to  Procedure(s): LEFT HEART CATH AND CORONARY ANGIOGRAPHY (N/A) as a surgical intervention.  The patient's history has been reviewed, patient examined, no change in status, stable for surgery.  I have reviewed the patient's chart and labs.  Questions were answered to the patient's satisfaction.     Belva Crome III

## 2020-05-07 NOTE — Discharge Instructions (Signed)
Radial Site Care  This sheet gives you information about how to care for yourself after your procedure. Your health care provider may also give you more specific instructions. If you have problems or questions, contact your health care provider. What can I expect after the procedure? After the procedure, it is common to have:  Bruising and tenderness at the catheter insertion area. Follow these instructions at home: Medicines  Take over-the-counter and prescription medicines only as told by your health care provider. Insertion site care  Follow instructions from your health care provider about how to take care of your insertion site. Make sure you: ? Wash your hands with soap and water before you change your bandage (dressing). If soap and water are not available, use hand sanitizer. ? Change your dressing as told by your health care provider. ? Leave stitches (sutures), skin glue, or adhesive strips in place. These skin closures may need to stay in place for 2 weeks or longer. If adhesive strip edges start to loosen and curl up, you may trim the loose edges. Do not remove adhesive strips completely unless your health care provider tells you to do that.  Check your insertion site every day for signs of infection. Check for: ? Redness, swelling, or pain. ? Fluid or blood. ? Pus or a bad smell. ? Warmth.  Do not take baths, swim, or use a hot tub until your health care provider approves.  You may shower 24-48 hours after the procedure, or as directed by your health care provider. ? Remove the dressing and gently wash the site with plain soap and water. ? Pat the area dry with a clean towel. ? Do not rub the site. That could cause bleeding.  Do not apply powder or lotion to the site. Activity   For 24 hours after the procedure, or as directed by your health care provider: ? Do not flex or bend the affected arm. ? Do not push or pull heavy objects with the affected arm. ? Do not  drive yourself home from the hospital or clinic. You may drive 24 hours after the procedure unless your health care provider tells you not to. ? Do not operate machinery or power tools.  Do not lift anything that is heavier than 10 lb (4.5 kg), or the limit that you are told, until your health care provider says that it is safe.  Ask your health care provider when it is okay to: ? Return to work or school. ? Resume usual physical activities or sports. ? Resume sexual activity. General instructions  If the catheter site starts to bleed, raise your arm and put firm pressure on the site. If the bleeding does not stop, get help right away. This is a medical emergency.  If you went home on the same day as your procedure, a responsible adult should be with you for the first 24 hours after you arrive home.  Keep all follow-up visits as told by your health care provider. This is important. Contact a health care provider if:  You have a fever.  You have redness, swelling, or yellow drainage around your insertion site. Get help right away if:  You have unusual pain at the radial site.  The catheter insertion area swells very fast.  The insertion area is bleeding, and the bleeding does not stop when you hold steady pressure on the area.  Your arm or hand becomes pale, cool, tingly, or numb. These symptoms may represent a serious problem   that is an emergency. Do not wait to see if the symptoms will go away. Get medical help right away. Call your local emergency services (911 in the [place] Do not drive yourself to the hospital. Summary  After the procedure, it is common to have bruising and tenderness at the site.  Follow instructions from your health care provider about how to take care of your radial site wound. Check the wound every day for signs of infection.  Do not lift anything that is heavier than 10 lb (4.5 kg), or the limit that you are told, until your health care provider says  that it is safe. This information is not intended to replace advice given to you by your health care provider. Make sure you discuss any questions you have with your health care provider. Document Revised: 06/13/2017 Document Reviewed: 06/13/2017 Elsevier Patient Education  2020 [employer]  

## 2020-05-10 ENCOUNTER — Other Ambulatory Visit: Payer: Self-pay | Admitting: *Deleted

## 2020-05-10 ENCOUNTER — Encounter (HOSPITAL_COMMUNITY): Payer: Self-pay | Admitting: Interventional Cardiology

## 2020-05-10 ENCOUNTER — Encounter: Payer: Self-pay | Admitting: *Deleted

## 2020-05-10 ENCOUNTER — Telehealth: Payer: Self-pay | Admitting: Interventional Cardiology

## 2020-05-10 DIAGNOSIS — R7989 Other specified abnormal findings of blood chemistry: Secondary | ICD-10-CM

## 2020-05-10 NOTE — Telephone Encounter (Signed)
° ° °  Pt is returning Jennifer's call to get resulta

## 2020-05-10 NOTE — Telephone Encounter (Signed)
Called pt and went over cath/PCI instructions.  Pt verbalized understanding and was appreciative for call.

## 2020-05-17 ENCOUNTER — Other Ambulatory Visit (HOSPITAL_COMMUNITY)
Admit: 2020-05-17 | Discharge: 2020-05-17 | Disposition: A | Discharge status: Home / Self Care | Payer: BLUE CROSS/BLUE SHIELD | Attending: Interventional Cardiology | Admitting: Interventional Cardiology

## 2020-05-17 ENCOUNTER — Other Ambulatory Visit: Payer: Self-pay

## 2020-05-17 ENCOUNTER — Other Ambulatory Visit: Payer: BLUE CROSS/BLUE SHIELD | Admitting: *Deleted

## 2020-05-17 DIAGNOSIS — Z20822 Contact with and (suspected) exposure to covid-19: Secondary | ICD-10-CM | POA: Insufficient documentation

## 2020-05-17 DIAGNOSIS — Z01812 Encounter for preprocedural laboratory examination: Secondary | ICD-10-CM | POA: Diagnosis not present

## 2020-05-17 DIAGNOSIS — R7989 Other specified abnormal findings of blood chemistry: Secondary | ICD-10-CM | POA: Diagnosis not present

## 2020-05-18 ENCOUNTER — Telehealth: Payer: Self-pay | Admitting: Interventional Cardiology

## 2020-05-18 LAB — BASIC METABOLIC PANEL
BUN/Creatinine Ratio: 18 (ref 10–24)
BUN: 29 mg/dL — ABNORMAL HIGH (ref 8–27)
CO2: 23 mmol/L (ref 20–29)
Calcium: 9.8 mg/dL (ref 8.6–10.2)
Chloride: 103 mmol/L (ref 96–106)
Creatinine, Ser: 1.58 mg/dL — ABNORMAL HIGH (ref 0.76–1.27)
GFR calc Af Amer: 54 mL/min/{1.73_m2} — ABNORMAL LOW (ref >59)
GFR calc non Af Amer: 47 mL/min/{1.73_m2} — ABNORMAL LOW (ref >59)
Glucose: 99 mg/dL (ref 65–99)
Potassium: 5.4 mmol/L — ABNORMAL HIGH (ref 3.5–5.2)
Sodium: 140 mmol/L (ref 134–144)

## 2020-05-18 LAB — SARS CORONAVIRUS 2 (TAT 6-24 HRS): SARS Coronavirus 2: NEGATIVE

## 2020-05-18 NOTE — Progress Notes (Signed)
Called patient to go over pre procedure instructions.  Left voice mail to arrive at hospital tomorrow @630  to receive extra IV fluids prior to the procedure.  Nothing to eat or drink after midnight.  You will need responsible adult to drive you home tomorrow as well as stay with you.

## 2020-05-18 NOTE — Telephone Encounter (Signed)
Called patient back. Patient wants to cancel heart cath due to being out of network. They would like Dr. Tamala Julian to advise on if it is okay to wait until February, when new insurance will have Dr. Tamala Julian in network. Patient stated if it is not okay to wait, they will reschedule heart cath right away. Also, patient's lab work came back with an elevated K at 5.5. Asked patient to cut potassium rich foods out of his diet and hold his losartan per protocol. Will have Dr. Tamala Julian advise on labs too.

## 2020-05-18 NOTE — Telephone Encounter (Signed)
     David Chang is calling back she said she called the hospital and was told to call us to cancel the procedure tomorrow

## 2020-05-18 NOTE — Telephone Encounter (Signed)
Patients daughter is calling to cancel his surgery in the cath lab tomorrow 05/18/20. She states that Dr. Tamala Julian is out of network and they want to know if its okay to cancel. Please advise.

## 2020-05-19 ENCOUNTER — Encounter (HOSPITAL_COMMUNITY): Admission: RE | Payer: Self-pay | Source: Home / Self Care

## 2020-05-19 ENCOUNTER — Telehealth: Payer: Self-pay | Admitting: Interventional Cardiology

## 2020-05-19 ENCOUNTER — Ambulatory Visit (HOSPITAL_COMMUNITY)
Admission: RE | Admit: 2020-05-19 | Payer: BLUE CROSS/BLUE SHIELD | Source: Home / Self Care | Admitting: Interventional Cardiology

## 2020-05-19 SURGERY — CORONARY STENT INTERVENTION
Anesthesia: LOCAL

## 2020-05-19 NOTE — Telephone Encounter (Signed)
Left the patient a message to call the office back, sent message to [facility]. In billing.

## 2020-05-19 NOTE — Telephone Encounter (Signed)
Pt wife called in to let Pam know that they are getting new ins the 1st of the year that will be in network with Dr Tamala Julian and Larence Penning.  They now have questions about pt med since he is no longer doing the surgery.  She would like to speak to [facility] about the pts meds   Best number [phone]

## 2020-05-20 ENCOUNTER — Telehealth: Payer: Self-pay | Admitting: Interventional Cardiology

## 2020-05-20 NOTE — Telephone Encounter (Signed)
Jarrett Soho, Daughter of the patient called to reschedule the patient's heart cath. The patient's wife has COVID and is not sure what the new protocols are for isolation and rescheduling the test. Please call to advise

## 2020-05-20 NOTE — Telephone Encounter (Signed)
Let [name] know we will work with him any way possible. If he starts having symptoms with typical activities this procedure will need to be done sooner rather than later.  Otherwise, the procedure can wait.  Please give [place] a call if there are questions and notify us when he is ready to move forward.

## 2020-05-20 NOTE — Telephone Encounter (Signed)
Left detailed message letting pt know information from Dr. Tamala Julian.  Advised to call with any changes or symptoms.  Advised to call when new insurance starts and we will get him back into the office and get everything set up.

## 2020-05-20 NOTE — Telephone Encounter (Signed)
Called pt and left message to call back as daughter is not on the [facility].

## 2020-05-20 NOTE — Telephone Encounter (Signed)
Spoke with pt and advised since he was exposed to covid we had to wait at least 10 days to schedule anything.  Scheduled pt to come into the office on 05/31/2020 to discuss PCI again and update everything.  Pt verbalized understanding and was appreciative for help.

## 2020-05-24 ENCOUNTER — Telehealth: Payer: Self-pay | Admitting: Interventional Cardiology

## 2020-05-24 NOTE — Telephone Encounter (Signed)
STAT if HR is under 50 or over 120 (normal HR is 60-100 beats per minute)  1) What is your heart rate? HR 78  2) Do you have a log of your heart rate readings (document readings)? No.  Do you have any other symptoms?  Patients daughter is calling stating that he has tested positive for covid and is concerned about his symptoms, She states that he has be complaining about his heart rate feeling slower. She is wondering if the dosage change for his Metoprolol from 100 mg to 200 mg is making his symptoms worse.  Please advise.

## 2020-05-24 NOTE — Telephone Encounter (Signed)
Called pt and left message to call back due to daughter not being on the [facility].

## 2020-05-25 NOTE — Telephone Encounter (Signed)
    Pt's daughter calling back to follow up, advised she is not on the DPR and need to speak with pt. She said she will call her dad and will let pt know a nurse will him today.

## 2020-05-25 NOTE — Telephone Encounter (Signed)
Patient states heart rate has been running 60 - 70's during the day at rest and 50 - 60's at night when sleeping. Blood pressure has been 110's sbp and 70's dbp. Advised the patient that these numbers are good to continue taking the increase metoprolol dosing. IF his heart rate drops below 50 and remains there or blood pressure gets < 100/50 to give the office a call with the readings and get new recommendations.   RN lets patient know she hopes he feels better soon and remember to get rest and stay hydrated.   Patient verbalized understanding.

## 2020-05-28 ENCOUNTER — Ambulatory Visit: Payer: BLUE CROSS/BLUE SHIELD | Admitting: Interventional Cardiology

## 2020-05-29 NOTE — H&P (View-Only) (Signed)
Cardiology Office Note:    Date:  05/29/2020   ID:  David Chang, DOB [birth year], MRN [id number]  PCP:  Gweneth Fritter, FNP  Cardiologist:  Sinclair Grooms, MD   Referring MD: Gweneth Fritter, FNP   No chief complaint on file.   History of Present Illness:    David Chang is a 60 y.o. male with a hx of CAD with multivessel DES including RCA, circumflex, and LADall in 2016. Hyperlipidemia not currently treated with statin therapy due to musculoskeletal syndrome, hypertension, and ischemic left ventricular dysfunction (EF 45-50%). Progression of de novo RCA CAD and Covid-19 infection.  Has cancelled high risk PCI twice.  The first instance had to do with insurance.  The most recent instance was because he was COVID 19 positive.  He is now 10 days out from testing positive.  His wife developed a positive COVID test before he did in late December.  His positive test was January 1.  He is asymptomatic at this time.  He has not had any since he stopped exercising.  He has been able to accomplish all of his activities of daily living.  Exercise however is very essential to him and he feels that a lifestyle measure has been taken away.  He does not want coronary bypass surgery.  We reviewed the angiogram.  He has total occlusion of a stent placed in his circumflex in 2016.  It depends on collaterals from the right coronary.  The right coronary is relatively small.  He has not needed nitroglycerin.  We discussed the increased technical # risk with his particular clinical scenario due to a shellfish "origin of the right coronary and an acute bend within the treatment segment.  The possibility of being unable to deliver stent around the bend exists.  There is also increased risk of vessel dissection.  Past Medical History:  Diagnosis Date  . Arthritis   . Coronary artery disease   . Hyperlipidemia LDL goal <70 04/10/2015  . Hypertension   . Ischemic cardiomyopathy 04/10/2015  . S/P  angioplasty with stent to mLAD DES, pRCA DES, mLCX DES 04/08/15  04/10/2015    Past Surgical History:  Procedure Laterality Date  . ACHILLES TENDON SURGERY Left 12/06/2017   Procedure: Complete Left Achilles tendon repair; dermaspan graf, application of cast;  Surgeon: Latanya Maudlin, MD;  Location: [facility];  Service: Orthopedics;  Laterality: Left;  . achillies repair     and medial gastrocnemius tear repair  . CARDIAC CATHETERIZATION N/A 04/08/2015   Procedure: Left Heart Cath and Coronary Angiography;  Surgeon: Leonie Man, MD;  Location: [facility];  Service: Cardiovascular;  Laterality: N/A;  . KNEE SURGERY Left   . LEFT HEART CATH AND CORONARY ANGIOGRAPHY N/A 05/07/2020   Procedure: LEFT HEART CATH AND CORONARY ANGIOGRAPHY;  Surgeon: Belva Crome, MD;  Location: [facility];  Service: Cardiovascular;  Laterality: N/A;  . left tricep surgery      Right tricep repair also  . TOTAL KNEE ARTHROPLASTY Left 04/24/2016   Procedure: LEFT TOTAL KNEE ARTHROPLASTY;  Surgeon: Gaynelle Arabian, MD;  Location: [facility];  Service: Orthopedics;  Laterality: Left;    Current Medications: No outpatient medications have been marked as taking for the 05/31/20 encounter (Appointment) with Belva Crome, MD.     Allergies:   Tramadol   Social History   Socioeconomic History  . Marital status: Married    Spouse name: Not on file  .  Number of children: Not on file  . Years of education: Not on file  . Highest education level: Not on file  Occupational History  . Not on file  Tobacco Use  . Smoking status: Never Smoker  . Smokeless tobacco: Never Used  Vaping Use  . Vaping Use: Never used  Substance and Sexual Activity  . Alcohol use: Yes    Alcohol/week: 0.0 standard drinks    Comment: casual drinking on Saturday   . Drug use: No  . Sexual activity: Yes  Other Topics Concern  . Not on file  Social History Narrative  . Not on file   Social Determinants of Health    Financial Resource Strain: Not on file  Food Insecurity: Not on file  Transportation Needs: Not on file  Physical Activity: Not on file  Stress: Not on file  Social Connections: Not on file     Family History: The patient's family history includes CVA in an other family member; Cancer (age of onset: 57) in his father; Heart attack (age of onset: 76) in his paternal grandfather; Hypertension in his sister.  ROS:   Please see the history of present illness.    He denies anabolic steroid use although even his wife and daughter send the contrary.  He has not been able to take statin therapy.  LDL cholesterol done in June 2020 was 135.  LDL cholesterol in 2016 was 203.  Lipids have not been followed closely by primary care because the patient has not committed to therapy.  He is tolerating Plavix without difficulty.  All other systems reviewed and are negative.  EKGs/Labs/Other Studies Reviewed:    The following studies were reviewed today: No new data  EKG:  EKG sinus rhythm, otherwise normal with the exception of nonspecific T wave flattening.  Recent Labs: 05/04/2020: Hemoglobin 16.5; Platelets 166 05/17/2020: BUN 29; Creatinine, Ser 1.58; Potassium 5.4; Sodium 140  Recent Lipid Panel    Component Value Date/Time   CHOL 251 (H) 04/08/2015 0545   TRIG 124 04/08/2015 0545   HDL 23 (L) 04/08/2015 0545   CHOLHDL 10.9 04/08/2015 0545   VLDL 25 04/08/2015 0545   LDLCALC 203 (H) 04/08/2015 0545    Physical Exam:    VS:  There were no vitals taken for this visit.    Wt Readings from Last 3 Encounters:  05/07/20 220 lb (99.8 kg)  05/04/20 218 lb 12.8 oz (99.2 kg)  04/29/20 227 lb (103 kg)     GEN: Healthy appearing. No acute distress HEENT: Normal NECK: No JVD. LYMPHATICS: No lymphadenopathy CARDIAC: No murmur. RRR S4 but no S3 gallop, or edema. VASCULAR:  Normal Pulses. No bruits. RESPIRATORY:  Clear to auscultation without rales, wheezing or rhonchi  ABDOMEN: Soft,  non-tender, non-distended, No pulsatile mass, MUSCULOSKELETAL: No deformity  SKIN: Warm and dry NEUROLOGIC:  Alert and oriented x 3 PSYCHIATRIC:  Normal affect   ASSESSMENT:    1. Coronary artery disease involving native coronary artery of native heart with unstable angina pectoris ([place])   2. Malignant hypertensive heart and CKD (chronic kidney disease) stage IV ([place])   3. Hypertension, essential   4. Hyperlipidemia LDL goal <70   5. Chronic combined systolic and diastolic heart failure, NYHA class 2 ([place])   6. Educated about COVID-19 virus infection    PLAN:    In order of problems listed above:  1. The patient has not done any aerobic exercise since initial angina and in early December.  He would  only get it after having been on aerobic equipment for and or getting his heart rate above 125.  He wants to get back to his active lifestyle.  We discussed PCA on his right coronary and increased risk of dissection, inability to deliver stent, and the possible need for emergency surgery.  All of these possibilities are estimated to be higher for his particular lesion subset than typical.  We will move forward on either this Thursday or whenever he makes a final decision.  He wanted to talk to his wife about when to do the procedure.  If surgery is needed, he would need the right coronary treated and also the distal portion of the circumflex since he has mid circumflex occlusion. 2. Blood pressure is better controlled on current medical regimen.  CKD stage III is present based upon the most recent creatinine of 1.58. 3. Excellent blood pressure control on today's visit 4. Last LDL cholesterol done in June 2020 was 136.  Last cholesterol prior to that time was 206 in 2016.  Creatinine performed on March 08, 2020 at [facility] was 142 LDL, total 206, triglyceride 190.  Rosuvastatin 20 mg/day as started.  He does not want to be on therapy but states he will do it if it helps his chances. 5. No  heart failure symptoms. 6. Recent COVID infection.  Had COVID-19 1 year ago as well.  This particular infection was like a cold.  He did not get ill and did not require hospitalization.  The patient was counseled to undergo left heart catheterization, coronary angiography, and percutaneous coronary intervention with stent implantation. The procedural risks and benefits were discussed in detail. The risks discussed included death, stroke, myocardial infarction, life-threatening bleeding, limb ischemia, kidney injury, allergy, and possible emergency cardiac surgery. The risk of these significant complications were estimated to occur less than 1% of the time. After discussion, the patient has agreed to proceed.  This procedure will have elevated risk because of sharp prescription, angulation within the treatment segment, and potential inability to deliver the stent around the acute event.  We discussed surgery as a primary option, and this is unappealing to the patient.  He does understand that if we are unable to deliver stent or have acute closure, he will need to have emergency surgery.  We will present his images at cath conference in 2 days.  We will get opinion from interventional colleagues.    Medication Adjustments/Labs and Tests Ordered: Current medicines are reviewed at length with the patient today.  Concerns regarding medicines are outlined above.  No orders of the defined types were placed in this encounter.  No orders of the defined types were placed in this encounter.   There are no Patient Instructions on file for this visit.   Signed, Sinclair Grooms, MD  05/29/2020 6:35 PM    [place]

## 2020-05-29 NOTE — Progress Notes (Signed)
Cardiology Office Note:    Date:  05/29/2020   ID:  David Chang, DOB 12/19/1960, MRN [id number]  PCP:  Gweneth Fritter, FNP  Cardiologist:  Sinclair Grooms, MD   Referring MD: Gweneth Fritter, FNP   No chief complaint on file.   History of Present Illness:    David Chang is a 60 y.o. male with a hx of CAD with multivessel DES including RCA, circumflex, and LADall in 2016. Hyperlipidemia not currently treated with statin therapy due to musculoskeletal syndrome, hypertension, and ischemic left ventricular dysfunction (EF 45-50%). Progression of de novo RCA CAD and Covid-19 infection.  Has cancelled high risk PCI twice.  The first instance had to do with insurance.  The most recent instance was because he was COVID 19 positive.  He is now 10 days out from testing positive.  His wife developed a positive COVID test before he did in late December.  His positive test was January 1.  He is asymptomatic at this time.  He has not had any since he stopped exercising.  He has been able to accomplish all of his activities of daily living.  Exercise however is very essential to him and he feels that a lifestyle measure has been taken away.  He does not want coronary bypass surgery.  We reviewed the angiogram.  He has total occlusion of a stent placed in his circumflex in 2016.  It depends on collaterals from the right coronary.  The right coronary is relatively small.  He has not needed nitroglycerin.  We discussed the increased technical # risk with his particular clinical scenario due to a shellfish "origin of the right coronary and an acute bend within the treatment segment.  The possibility of being unable to deliver stent around the bend exists.  There is also increased risk of vessel dissection.  Past Medical History:  Diagnosis Date  . Arthritis   . Coronary artery disease   . Hyperlipidemia LDL goal <70 04/10/2015  . Hypertension   . Ischemic cardiomyopathy 04/10/2015  . S/P  angioplasty with stent to mLAD DES, pRCA DES, mLCX DES 04/08/15  04/10/2015    Past Surgical History:  Procedure Laterality Date  . ACHILLES TENDON SURGERY Left 12/06/2017   Procedure: Complete Left Achilles tendon repair; dermaspan graf, application of cast;  Surgeon: Latanya Maudlin, MD;  Location: [facility];  Service: Orthopedics;  Laterality: Left;  . achillies repair     and medial gastrocnemius tear repair  . CARDIAC CATHETERIZATION N/A 04/08/2015   Procedure: Left Heart Cath and Coronary Angiography;  Surgeon: Leonie Man, MD;  Location: [place] [facility];  Service: Cardiovascular;  Laterality: N/A;  . KNEE SURGERY Left   . LEFT HEART CATH AND CORONARY ANGIOGRAPHY N/A 05/07/2020   Procedure: LEFT HEART CATH AND CORONARY ANGIOGRAPHY;  Surgeon: Belva Crome, MD;  Location: [place] [facility];  Service: Cardiovascular;  Laterality: N/A;  . left tricep surgery      Right tricep repair also  . TOTAL KNEE ARTHROPLASTY Left 04/24/2016   Procedure: LEFT TOTAL KNEE ARTHROPLASTY;  Surgeon: Gaynelle Arabian, MD;  Location: [facility];  Service: Orthopedics;  Laterality: Left;    Current Medications: No outpatient medications have been marked as taking for the 05/31/20 encounter (Appointment) with Belva Crome, MD.     Allergies:   Tramadol   Social History   Socioeconomic History  . Marital status: Married    Spouse name: Not on file  .  Number of children: Not on file  . Years of education: Not on file  . Highest education level: Not on file  Occupational History  . Not on file  Tobacco Use  . Smoking status: Never Smoker  . Smokeless tobacco: Never Used  Vaping Use  . Vaping Use: Never used  Substance and Sexual Activity  . Alcohol use: Yes    Alcohol/week: 0.0 standard drinks    Comment: casual drinking on Saturday   . Drug use: No  . Sexual activity: Yes  Other Topics Concern  . Not on file  Social History Narrative  . Not on file   Social Determinants of Health    Financial Resource Strain: Not on file  Food Insecurity: Not on file  Transportation Needs: Not on file  Physical Activity: Not on file  Stress: Not on file  Social Connections: Not on file     Family History: The patient's family history includes CVA in an other family member; Cancer (age of onset: 52) in his father; Heart attack (age of onset: 14) in his paternal grandfather; Hypertension in his sister.  ROS:   Please see the history of present illness.    He denies anabolic steroid use although even his wife and daughter send the contrary.  He has not been able to take statin therapy.  LDL cholesterol done in June 2020 was 135.  LDL cholesterol in 2016 was 203.  Lipids have not been followed closely by primary care because the patient has not committed to therapy.  He is tolerating Plavix without difficulty.  All other systems reviewed and are negative.  EKGs/Labs/Other Studies Reviewed:    The following studies were reviewed today: No new data  EKG:  EKG sinus rhythm, otherwise normal with the exception of nonspecific T wave flattening.  Recent Labs: 05/04/2020: Hemoglobin 16.5; Platelets 166 05/17/2020: BUN 29; Creatinine, Ser 1.58; Potassium 5.4; Sodium 140  Recent Lipid Panel    Component Value Date/Time   CHOL 251 (H) 04/08/2015 0545   TRIG 124 04/08/2015 0545   HDL 23 (L) 04/08/2015 0545   CHOLHDL 10.9 04/08/2015 0545   VLDL 25 04/08/2015 0545   LDLCALC 203 (H) 04/08/2015 0545    Physical Exam:    VS:  There were no vitals taken for this visit.    Wt Readings from Last 3 Encounters:  05/07/20 220 lb (99.8 kg)  05/04/20 218 lb 12.8 oz (99.2 kg)  04/29/20 227 lb (103 kg)     GEN: Healthy appearing. No acute distress HEENT: Normal NECK: No JVD. LYMPHATICS: No lymphadenopathy CARDIAC: No murmur. RRR S4 but no S3 gallop, or edema. VASCULAR:  Normal Pulses. No bruits. RESPIRATORY:  Clear to auscultation without rales, wheezing or rhonchi  ABDOMEN: Soft,  non-tender, non-distended, No pulsatile mass, MUSCULOSKELETAL: No deformity  SKIN: Warm and dry NEUROLOGIC:  Alert and oriented x 3 PSYCHIATRIC:  Normal affect   ASSESSMENT:    1. Coronary artery disease involving native coronary artery of native heart with unstable angina pectoris ([place])   2. Malignant hypertensive heart and CKD (chronic kidney disease) stage IV ([place])   3. Hypertension, essential   4. Hyperlipidemia LDL goal <70   5. Chronic combined systolic and diastolic heart failure, NYHA class 2 ([place])   6. Educated about COVID-19 virus infection    PLAN:    In order of problems listed above:  1. The patient has not done any aerobic exercise since initial angina and in early December.  He would  only get it after having been on aerobic equipment for and or getting his heart rate above 125.  He wants to get back to his active lifestyle.  We discussed PCA on his right coronary and increased risk of dissection, inability to deliver stent, and the possible need for emergency surgery.  All of these possibilities are estimated to be higher for his particular lesion subset than typical.  We will move forward on either this Thursday or whenever he makes a final decision.  He wanted to talk to his wife about when to do the procedure.  If surgery is needed, he would need the right coronary treated and also the distal portion of the circumflex since he has mid circumflex occlusion. 2. Blood pressure is better controlled on current medical regimen.  CKD stage III is present based upon the most recent creatinine of 1.58. 3. Excellent blood pressure control on today's visit 4. Last LDL cholesterol done in June 2020 was 136.  Last cholesterol prior to that time was 206 in 2016.  Creatinine performed on March 08, 2020 at [facility] was 142 LDL, total 206, triglyceride 190.  Rosuvastatin 20 mg/day as started.  He does not want to be on therapy but states he will do it if it helps his chances. 5. No  heart failure symptoms. 6. Recent COVID infection.  Had COVID-19 1 year ago as well.  This particular infection was like a cold.  He did not get ill and did not require hospitalization.  The patient was counseled to undergo left heart catheterization, coronary angiography, and percutaneous coronary intervention with stent implantation. The procedural risks and benefits were discussed in detail. The risks discussed included death, stroke, myocardial infarction, life-threatening bleeding, limb ischemia, kidney injury, allergy, and possible emergency cardiac surgery. The risk of these significant complications were estimated to occur less than 1% of the time. After discussion, the patient has agreed to proceed.  This procedure will have elevated risk because of sharp prescription, angulation within the treatment segment, and potential inability to deliver the stent around the acute event.  We discussed surgery as a primary option, and this is unappealing to the patient.  He does understand that if we are unable to deliver stent or have acute closure, he will need to have emergency surgery.  We will present his images at cath conference in 2 days.  We will get opinion from interventional colleagues.    Medication Adjustments/Labs and Tests Ordered: Current medicines are reviewed at length with the patient today.  Concerns regarding medicines are outlined above.  No orders of the defined types were placed in this encounter.  No orders of the defined types were placed in this encounter.   There are no Patient Instructions on file for this visit.   Signed, Sinclair Grooms, MD  05/29/2020 6:35 PM    [place]

## 2020-05-31 ENCOUNTER — Telehealth: Payer: Self-pay | Admitting: *Deleted

## 2020-05-31 ENCOUNTER — Other Ambulatory Visit: Payer: Self-pay

## 2020-05-31 ENCOUNTER — Telehealth: Payer: Self-pay | Admitting: Interventional Cardiology

## 2020-05-31 ENCOUNTER — Encounter: Payer: Self-pay | Admitting: Interventional Cardiology

## 2020-05-31 ENCOUNTER — Ambulatory Visit: Payer: BC Managed Care – PPO | Admitting: Interventional Cardiology

## 2020-05-31 VITALS — BP 126/70 | HR 65 | Ht 72.0 in | Wt 216.0 lb

## 2020-05-31 DIAGNOSIS — E785 Hyperlipidemia, unspecified: Secondary | ICD-10-CM

## 2020-05-31 DIAGNOSIS — I25110 Atherosclerotic heart disease of native coronary artery with unstable angina pectoris: Secondary | ICD-10-CM | POA: Diagnosis not present

## 2020-05-31 DIAGNOSIS — I5042 Chronic combined systolic (congestive) and diastolic (congestive) heart failure: Secondary | ICD-10-CM

## 2020-05-31 DIAGNOSIS — U071 COVID-19: Secondary | ICD-10-CM

## 2020-05-31 DIAGNOSIS — N184 Chronic kidney disease, stage 4 (severe): Secondary | ICD-10-CM

## 2020-05-31 DIAGNOSIS — I10 Essential (primary) hypertension: Secondary | ICD-10-CM

## 2020-05-31 DIAGNOSIS — I1310 Hypertensive heart and chronic kidney disease without heart failure, with stage 1 through stage 4 chronic kidney disease, or unspecified chronic kidney disease: Secondary | ICD-10-CM | POA: Diagnosis not present

## 2020-05-31 DIAGNOSIS — Z7189 Other specified counseling: Secondary | ICD-10-CM

## 2020-05-31 MED ORDER — ROSUVASTATIN CALCIUM 20 MG PO TABS: 20.0000 mg | ORAL_TABLET | Freq: Every day | ORAL | 3 refills | Status: DC

## 2020-05-31 NOTE — Telephone Encounter (Signed)
I received message that patient had been scheduled for stent placement 06/03/20, no pre-procedure COVID-19 test scheduled since patient tested positive for COVID 05/22/20. Call placed to patient to get documentation of positive COVID-19 result 05/22/20.  LMTCB to discuss with patient.

## 2020-05-31 NOTE — Patient Instructions (Addendum)
Medication Instructions:  Your physician recommends that you continue on your current medications as directed. Please refer to the Current Medication list given to you today.  *If you need a refill on your cardiac medications before your next appointment, please call your pharmacy*   Lab Work: BMET and CBC today  If you have labs (blood work) drawn today and your tests are completely normal, you will receive your results only by: Marland Kitchen MyChart Message (if you have MyChart) OR . A paper copy in the mail If you have any lab test that is abnormal or we need to change your treatment, we will call you to review the results.   Testing/Procedures: Your physician has requested that you have a cardiac catheterization. Cardiac catheterization is used to diagnose and/or treat various heart conditions. Doctors may recommend this procedure for a number of different reasons. The most common reason is to evaluate chest pain. Chest pain can be a symptom of coronary artery disease (CAD), and cardiac catheterization can show whether plaque is narrowing or blocking your heart's arteries. This procedure is also used to evaluate the valves, as well as measure the blood flow and oxygen levels in different parts of your heart. For further information please visit [email or web address]. Please follow instruction sheet, as given.    Follow-Up: At [facility], you and your health needs are our priority.  As part of our continuing mission to provide you with exceptional heart care, we have created designated Provider Care Teams.  These Care Teams include your primary Cardiologist (physician) and Advanced Practice Providers (APPs -  Physician Assistants and Nurse Practitioners) who all work together to provide you with the care you need, when you need it.  We recommend signing up for the patient portal called "MyChart".  Sign up information is provided on this After Visit Summary.  MyChart is used to connect with patients  for Virtual Visits (Telemedicine).  Patients are able to view lab/test results, encounter notes, upcoming appointments, etc.  Non-urgent messages can be sent to your provider as well.   To learn more about what you can do with MyChart, go to [email or web address].    Your next appointment:   2-3 week(s) after cath  The format for your next appointment:   In Person  Provider:   You may see Sinclair Grooms, MD or one of the following Advanced Practice Providers on your designated Care Team:    Truitt Merle, NP  Cecilie Kicks, NP  Kathyrn Drown, NP    Other Instructions     [place] OFFICE [place], [place] [address] Dept: [phone] Loc: [place]  05/31/2020  You are scheduled for a Cardiac Catheterization on Thursday, January 13 with Dr. Daneen Schick.  1. Please arrive at the [facility] (Main Entrance A) at [facility]: [address] [place], [place] [address] at 7:00 AM (This time is two hours before your procedure to ensure your preparation). Free valet parking service is available.   Special note: Every effort is made to have your procedure done on time. Please understand that emergencies sometimes delay scheduled procedures.  2. Diet: Do not eat solid foods after midnight.  The patient may have clear liquids until 5am upon the day of the procedure.  3. Labs: You will have labs drawn today.  4. Medication instructions in preparation for your procedure:   Contrast Allergy: No  On  the morning of your procedure, take your Aspirin and Plavix/Clopidogrel and any morning medicines NOT listed above.  You may use sips of water.  5. Plan for one night stay--bring personal belongings. 6. Bring a current list of your medications and current insurance cards. 7. You MUST have a responsible person to drive you home. 8. Someone MUST be with  you the first 24 hours after you arrive home or your discharge will be delayed. 9. Please wear clothes that are easy to get on and off and wear slip-on shoes.  Thank you for allowing [place] to care for you!   -- [place] [facility]

## 2020-05-31 NOTE — Telephone Encounter (Signed)
Encounter not needed

## 2020-05-31 NOTE — Telephone Encounter (Signed)
Spoke with pt and he would like to move PCI out to 1/25.  Inquired about positive covid results.  Pt states that he did a home test since his wife and daughter were positive and that's where his result was from.  Did not go to a medical facility for testing at all.  Advised we would need to covid test him then.  Scheduled pt for labs and covid screen on 1/21.  Cath scheduled for 1/25.  Pt aware that if covid test comes back positive, that we will have to move PCI out again.  Pt verbalized understanding and was in agreement with plan.

## 2020-05-31 NOTE — Addendum Note (Signed)
Addended by: Loren Racer on: 05/31/2020 02:10 PM   Modules accepted: Orders

## 2020-06-09 ENCOUNTER — Telehealth: Payer: Self-pay | Admitting: Interventional Cardiology

## 2020-06-09 NOTE — Telephone Encounter (Signed)
Due to inclement weather on Friday, contacted pt to move up covid screen and labs so that we did not have to cancel cath again.  Pt agreeable to come to [facility] for covid screen and labs on 1/20.  He knows to quarantine after until time of cath.

## 2020-06-10 ENCOUNTER — Other Ambulatory Visit (HOSPITAL_COMMUNITY)
Admission: RE | Admit: 2020-06-10 | Discharge: 2020-06-10 | Disposition: A | Discharge status: Home / Self Care | Payer: BC Managed Care – PPO | Source: Ambulatory Visit | Attending: Interventional Cardiology | Admitting: Interventional Cardiology

## 2020-06-10 ENCOUNTER — Ambulatory Visit (INDEPENDENT_AMBULATORY_CARE_PROVIDER_SITE_OTHER): Payer: BC Managed Care – PPO | Admitting: Nurse Practitioner

## 2020-06-10 ENCOUNTER — Other Ambulatory Visit: Payer: Self-pay

## 2020-06-10 DIAGNOSIS — I25110 Atherosclerotic heart disease of native coronary artery with unstable angina pectoris: Secondary | ICD-10-CM | POA: Diagnosis not present

## 2020-06-10 DIAGNOSIS — Z20822 Contact with and (suspected) exposure to covid-19: Secondary | ICD-10-CM | POA: Diagnosis not present

## 2020-06-10 DIAGNOSIS — Z01812 Encounter for preprocedural laboratory examination: Secondary | ICD-10-CM | POA: Diagnosis not present

## 2020-06-11 ENCOUNTER — Other Ambulatory Visit (HOSPITAL_COMMUNITY): Payer: BC Managed Care – PPO

## 2020-06-11 ENCOUNTER — Ambulatory Visit: Payer: BC Managed Care – PPO

## 2020-06-11 LAB — BASIC METABOLIC PANEL
BUN/Creatinine Ratio: 21 (ref 10–24)
BUN: 41 mg/dL — ABNORMAL HIGH (ref 8–27)
CO2: 22 mmol/L (ref 20–29)
Calcium: 9.9 mg/dL (ref 8.6–10.2)
Chloride: 102 mmol/L (ref 96–106)
Creatinine, Ser: 1.91 mg/dL — ABNORMAL HIGH (ref 0.76–1.27)
GFR calc Af Amer: 43 mL/min/{1.73_m2} — ABNORMAL LOW (ref >59)
GFR calc non Af Amer: 37 mL/min/{1.73_m2} — ABNORMAL LOW (ref >59)
Sodium: 140 mmol/L (ref 134–144)

## 2020-06-11 LAB — CBC
Hematocrit: 42.5 % (ref 37.5–51.0)
Hemoglobin: 14.7 g/dL (ref 13.0–17.7)
MCH: 31.5 pg (ref 26.6–33.0)
MCHC: 34.6 g/dL (ref 31.5–35.7)
MCV: 91 fL (ref 79–97)
Platelets: 141 10*3/uL — ABNORMAL LOW (ref 150–450)
RBC: 4.66 x10E6/uL (ref 4.14–5.80)
RDW: 12.5 % (ref 11.6–15.4)
WBC: 8.7 10*3/uL (ref 3.4–10.8)

## 2020-06-11 LAB — SARS CORONAVIRUS 2 (TAT 6-24 HRS): SARS Coronavirus 2: NEGATIVE

## 2020-06-14 ENCOUNTER — Telehealth: Payer: Self-pay | Admitting: *Deleted

## 2020-06-14 NOTE — Telephone Encounter (Addendum)
Pt contacted pre-catheterization scheduled at [facility] for: Tuesday June 15, 2020 12 Noon Verified arrival time and place: [facility]) at: 7 AM-pre-procedure hydration/needs BMP *    No solid food after midnight prior to cath, clear liquids until 5 AM day of procedure.  Hold: Losartan-day before and day of procedure  Except hold medications AM meds can be  taken pre-cath with sips of water including: ASA 81 mg Plavix 75 mg   Confirmed patient has responsible adult to drive home post procedure and be with patient first 24 hours after arriving home: yes  You are allowed ONE visitor in the waiting room during the time you are at the hospital for your procedure. Both you and your visitor must wear a mask once you enter the hospital.   Reviewed procedure/mask/visitor instructions with patient.  *BMP done 06/10/20 glucose and potassium cancelled.  Call placed Sh[facility]a at [facility]-these were cancelled due to way specimen was processed, results will not be available.  Call placed to patient to see  if he could go by [employer] this morning to have repeat BMP prior to procedure tomorrow.  Pt reports due to his schedule today,  he will not be able to go to office for repeat BMP this morning.  Pt reminded that he needs to continue to quarantine until he goes to hospital for procedure in the morning and states he will.   Pt is aware will plan to get BMP on arrival to Short Stay in the morning.

## 2020-06-15 ENCOUNTER — Encounter (HOSPITAL_COMMUNITY)
Admission: RE | Disposition: A | Discharge status: Home / Self Care | Payer: Self-pay | Source: Home / Self Care | Attending: Interventional Cardiology

## 2020-06-15 ENCOUNTER — Ambulatory Visit (HOSPITAL_COMMUNITY)
Admission: RE | Admit: 2020-06-15 | Discharge: 2020-06-16 | Disposition: A | Discharge status: Home / Self Care | Payer: BC Managed Care – PPO | Attending: Interventional Cardiology | Admitting: Interventional Cardiology

## 2020-06-15 ENCOUNTER — Other Ambulatory Visit: Payer: Self-pay

## 2020-06-15 DIAGNOSIS — I5042 Chronic combined systolic (congestive) and diastolic (congestive) heart failure: Secondary | ICD-10-CM | POA: Diagnosis not present

## 2020-06-15 DIAGNOSIS — E785 Hyperlipidemia, unspecified: Secondary | ICD-10-CM | POA: Diagnosis not present

## 2020-06-15 DIAGNOSIS — Z955 Presence of coronary angioplasty implant and graft: Secondary | ICD-10-CM | POA: Diagnosis not present

## 2020-06-15 DIAGNOSIS — I25110 Atherosclerotic heart disease of native coronary artery with unstable angina pectoris: Secondary | ICD-10-CM | POA: Diagnosis not present

## 2020-06-15 DIAGNOSIS — Z8249 Family history of ischemic heart disease and other diseases of the circulatory system: Secondary | ICD-10-CM | POA: Insufficient documentation

## 2020-06-15 DIAGNOSIS — I255 Ischemic cardiomyopathy: Secondary | ICD-10-CM | POA: Insufficient documentation

## 2020-06-15 DIAGNOSIS — Z96652 Presence of left artificial knee joint: Secondary | ICD-10-CM | POA: Diagnosis not present

## 2020-06-15 DIAGNOSIS — I209 Angina pectoris, unspecified: Secondary | ICD-10-CM | POA: Diagnosis not present

## 2020-06-15 DIAGNOSIS — Z8616 Personal history of COVID-19: Secondary | ICD-10-CM | POA: Insufficient documentation

## 2020-06-15 DIAGNOSIS — I130 Hypertensive heart and chronic kidney disease with heart failure and stage 1 through stage 4 chronic kidney disease, or unspecified chronic kidney disease: Secondary | ICD-10-CM | POA: Diagnosis not present

## 2020-06-15 DIAGNOSIS — N184 Chronic kidney disease, stage 4 (severe): Secondary | ICD-10-CM | POA: Insufficient documentation

## 2020-06-15 DIAGNOSIS — Z885 Allergy status to narcotic agent status: Secondary | ICD-10-CM | POA: Diagnosis not present

## 2020-06-15 DIAGNOSIS — I2510 Atherosclerotic heart disease of native coronary artery without angina pectoris: Secondary | ICD-10-CM | POA: Diagnosis present

## 2020-06-15 HISTORY — PX: CORONARY STENT INTERVENTION: CATH118234

## 2020-06-15 HISTORY — PX: CORONARY ANGIOGRAPHY: CATH118303

## 2020-06-15 LAB — BASIC METABOLIC PANEL
Anion gap: 7 (ref 5–15)
Anion gap: 8 (ref 5–15)
BUN: 28 mg/dL — ABNORMAL HIGH (ref 8–23)
BUN: 26 mg/dL — ABNORMAL HIGH (ref 8–23)
CO2: 25 mmol/L (ref 22–32)
CO2: 25 mmol/L (ref 22–32)
Calcium: 9.2 mg/dL (ref 8.9–10.3)
Calcium: 9.2 mg/dL (ref 8.9–10.3)
Chloride: 106 mmol/L (ref 98–111)
Chloride: 104 mmol/L (ref 98–111)
Creatinine, Ser: 1.52 mg/dL — ABNORMAL HIGH (ref 0.61–1.24)
Creatinine, Ser: 1.38 mg/dL — ABNORMAL HIGH (ref 0.61–1.24)
GFR, Estimated: 52 mL/min — ABNORMAL LOW (ref >60)
GFR, Estimated: 58 mL/min — ABNORMAL LOW (ref >60)
Glucose, Bld: 103 mg/dL — ABNORMAL HIGH (ref 70–99)
Glucose, Bld: 126 mg/dL — ABNORMAL HIGH (ref 70–99)
Potassium: 4.6 mmol/L (ref 3.5–5.1)
Potassium: 4 mmol/L (ref 3.5–5.1)
Sodium: 138 mmol/L (ref 135–145)
Sodium: 137 mmol/L (ref 135–145)

## 2020-06-15 LAB — CREATININE, SERUM
Creatinine, Ser: 1.51 mg/dL — ABNORMAL HIGH (ref 0.61–1.24)
GFR, Estimated: 52 mL/min — ABNORMAL LOW (ref >60)

## 2020-06-15 LAB — CBC
HCT: 40.7 % (ref 39.0–52.0)
Hemoglobin: 14.2 g/dL (ref 13.0–17.0)
MCH: 31.6 pg (ref 26.0–34.0)
MCHC: 34.9 g/dL (ref 30.0–36.0)
MCV: 90.6 fL (ref 80.0–100.0)
Platelets: 152 10*3/uL (ref 150–400)
RBC: 4.49 MIL/uL (ref 4.22–5.81)
RDW: 12.3 % (ref 11.5–15.5)
WBC: 9.5 10*3/uL (ref 4.0–10.5)
nRBC: 0 % (ref 0.0–0.2)

## 2020-06-15 LAB — POCT ACTIVATED CLOTTING TIME
Activated Clotting Time: 321 seconds
Activated Clotting Time: 172 seconds

## 2020-06-15 SURGERY — CORONARY STENT INTERVENTION
Anesthesia: LOCAL

## 2020-06-15 MED ORDER — SODIUM CHLORIDE 0.9 % WEIGHT BASED INFUSION
1.0000 mL/kg/h | INTRAVENOUS | Status: DC
  Administered 2020-06-15: 1 mL/kg/h via INTRAVENOUS

## 2020-06-15 MED ORDER — HYDRALAZINE HCL 20 MG/ML IJ SOLN: 10.0000 mg | INTRAMUSCULAR | Status: AC | PRN

## 2020-06-15 MED ORDER — LABETALOL HCL 5 MG/ML IV SOLN: 10.0000 mg | INTRAVENOUS | Status: AC | PRN

## 2020-06-15 MED ORDER — HEPARIN SODIUM (PORCINE) 5000 UNIT/ML IJ SOLN
5000.0000 [IU] | Freq: Three times a day (TID) | INTRAMUSCULAR | Status: DC
  Administered 2020-06-16: 5000 [IU] via SUBCUTANEOUS
  Filled 2020-06-15: qty 1

## 2020-06-15 MED ORDER — CLOPIDOGREL BISULFATE 75 MG PO TABS: 75.0000 mg | ORAL_TABLET | Freq: Every day | ORAL | Status: DC

## 2020-06-15 MED ORDER — CLOPIDOGREL BISULFATE 75 MG PO TABS
ORAL_TABLET | ORAL | Status: AC
  Filled 2020-06-15: qty 2

## 2020-06-15 MED ORDER — SODIUM CHLORIDE 0.9% FLUSH: 3.0000 mL | INTRAVENOUS | Status: DC | PRN

## 2020-06-15 MED ORDER — IOHEXOL 350 MG/ML SOLN
INTRAVENOUS | Status: AC
  Filled 2020-06-15: qty 1

## 2020-06-15 MED ORDER — ACETAMINOPHEN 325 MG PO TABS: 650.0000 mg | ORAL_TABLET | ORAL | Status: DC | PRN

## 2020-06-15 MED ORDER — FENTANYL CITRATE (PF) 100 MCG/2ML IJ SOLN
INTRAMUSCULAR | Status: DC | PRN
  Administered 2020-06-15: 50 ug via INTRAVENOUS
  Administered 2020-06-15: 25 ug via INTRAVENOUS

## 2020-06-15 MED ORDER — MIDAZOLAM HCL 2 MG/2ML IJ SOLN
INTRAMUSCULAR | Status: AC
  Filled 2020-06-15: qty 2

## 2020-06-15 MED ORDER — LIDOCAINE HCL (PF) 1 % IJ SOLN
INTRAMUSCULAR | Status: AC
  Filled 2020-06-15: qty 30

## 2020-06-15 MED ORDER — OMEGA-3-ACID ETHYL ESTERS 1 G PO CAPS
1000.0000 mg | ORAL_CAPSULE | Freq: Every day | ORAL | Status: DC
  Administered 2020-06-16: 1000 mg via ORAL
  Filled 2020-06-15: qty 1

## 2020-06-15 MED ORDER — AMLODIPINE BESYLATE 10 MG PO TABS
10.0000 mg | ORAL_TABLET | Freq: Every day | ORAL | Status: DC
  Administered 2020-06-16: 10 mg via ORAL
  Filled 2020-06-15: qty 1

## 2020-06-15 MED ORDER — BIVALIRUDIN TRIFLUOROACETATE 250 MG IV SOLR
INTRAVENOUS | Status: AC
  Filled 2020-06-15: qty 250

## 2020-06-15 MED ORDER — ASPIRIN EC 81 MG PO TBEC
81.0000 mg | DELAYED_RELEASE_TABLET | Freq: Every day | ORAL | Status: DC
  Administered 2020-06-16: 81 mg via ORAL
  Filled 2020-06-15: qty 1

## 2020-06-15 MED ORDER — MIDAZOLAM HCL 2 MG/2ML IJ SOLN
INTRAMUSCULAR | Status: DC | PRN
  Administered 2020-06-15 (×2): 1 mg via INTRAVENOUS

## 2020-06-15 MED ORDER — CLOPIDOGREL BISULFATE 75 MG PO TABS
ORAL_TABLET | ORAL | Status: DC | PRN
  Administered 2020-06-15: 150 mg via ORAL

## 2020-06-15 MED ORDER — BIVALIRUDIN BOLUS VIA INFUSION - CUPID
INTRAVENOUS | Status: DC | PRN
  Administered 2020-06-15: 73.125 mg via INTRAVENOUS

## 2020-06-15 MED ORDER — ASPIRIN 81 MG PO CHEW: 81.0000 mg | CHEWABLE_TABLET | ORAL | Status: DC

## 2020-06-15 MED ORDER — ASPIRIN 81 MG PO CHEW: 81.0000 mg | CHEWABLE_TABLET | Freq: Every day | ORAL | Status: DC

## 2020-06-15 MED ORDER — SODIUM CHLORIDE 0.9% FLUSH: 3.0000 mL | Freq: Two times a day (BID) | INTRAVENOUS | Status: DC

## 2020-06-15 MED ORDER — SODIUM CHLORIDE 0.9 % WEIGHT BASED INFUSION
3.0000 mL/kg/h | INTRAVENOUS | Status: DC
  Administered 2020-06-15: 3 mL/kg/h via INTRAVENOUS

## 2020-06-15 MED ORDER — SODIUM CHLORIDE 0.9 % WEIGHT BASED INFUSION
1.0000 mL/kg/h | INTRAVENOUS | Status: AC
  Administered 2020-06-15: 1 mL/kg/h via INTRAVENOUS

## 2020-06-15 MED ORDER — NITROGLYCERIN 0.4 MG SL SUBL: 0.4000 mg | SUBLINGUAL_TABLET | SUBLINGUAL | Status: DC | PRN

## 2020-06-15 MED ORDER — POLYVINYL ALCOHOL 1.4 % OP SOLN: 1.0000 [drp] | Freq: Every day | OPHTHALMIC | Status: DC | PRN

## 2020-06-15 MED ORDER — SODIUM CHLORIDE 0.9 % IV SOLN: 250.0000 mL | INTRAVENOUS | Status: DC | PRN

## 2020-06-15 MED ORDER — LIDOCAINE HCL (PF) 1 % IJ SOLN
INTRAMUSCULAR | Status: DC | PRN
  Administered 2020-06-15: 8 mL
  Administered 2020-06-15: 10 mL

## 2020-06-15 MED ORDER — HEPARIN SODIUM (PORCINE) 1000 UNIT/ML IJ SOLN
INTRAMUSCULAR | Status: AC
  Filled 2020-06-15: qty 1

## 2020-06-15 MED ORDER — CLOPIDOGREL BISULFATE 75 MG PO TABS
75.0000 mg | ORAL_TABLET | Freq: Every day | ORAL | Status: DC
  Administered 2020-06-16: 75 mg via ORAL
  Filled 2020-06-15: qty 1

## 2020-06-15 MED ORDER — TRAZODONE HCL 50 MG PO TABS
50.0000 mg | ORAL_TABLET | Freq: Every evening | ORAL | Status: DC | PRN
  Administered 2020-06-15: 100 mg via ORAL
  Filled 2020-06-15: qty 2

## 2020-06-15 MED ORDER — ROSUVASTATIN CALCIUM 20 MG PO TABS
20.0000 mg | ORAL_TABLET | Freq: Every day | ORAL | Status: DC
  Administered 2020-06-16: 20 mg via ORAL
  Filled 2020-06-15: qty 1

## 2020-06-15 MED ORDER — SODIUM CHLORIDE 0.9 % IV SOLN
INTRAVENOUS | Status: DC | PRN
  Administered 2020-06-15 (×2): 1.75 mg/kg/h via INTRAVENOUS

## 2020-06-15 MED ORDER — CLOPIDOGREL BISULFATE 75 MG PO TABS: 75.0000 mg | ORAL_TABLET | ORAL | Status: DC

## 2020-06-15 MED ORDER — NITROGLYCERIN 1 MG/10 ML FOR IR/CATH LAB
INTRA_ARTERIAL | Status: AC
  Filled 2020-06-15: qty 10

## 2020-06-15 MED ORDER — FENTANYL CITRATE (PF) 100 MCG/2ML IJ SOLN
INTRAMUSCULAR | Status: AC
  Filled 2020-06-15: qty 2

## 2020-06-15 MED ORDER — HEPARIN (PORCINE) IN NACL 1000-0.9 UT/500ML-% IV SOLN
INTRAVENOUS | Status: DC | PRN
  Administered 2020-06-15 (×2): 500 mL

## 2020-06-15 MED ORDER — METOPROLOL SUCCINATE ER 100 MG PO TB24
200.0000 mg | ORAL_TABLET | Freq: Every day | ORAL | Status: DC
  Administered 2020-06-16: 200 mg via ORAL
  Filled 2020-06-15: qty 2

## 2020-06-15 MED ORDER — HEPARIN (PORCINE) IN NACL 1000-0.9 UT/500ML-% IV SOLN
INTRAVENOUS | Status: AC
  Filled 2020-06-15: qty 1000

## 2020-06-15 MED ORDER — ONDANSETRON HCL 4 MG/2ML IJ SOLN: 4.0000 mg | Freq: Four times a day (QID) | INTRAMUSCULAR | Status: DC | PRN

## 2020-06-15 MED ORDER — PANTOPRAZOLE SODIUM 20 MG PO TBEC
20.0000 mg | DELAYED_RELEASE_TABLET | Freq: Every day | ORAL | Status: DC | PRN
  Filled 2020-06-15: qty 1

## 2020-06-15 MED ORDER — IOHEXOL 350 MG/ML SOLN
INTRAVENOUS | Status: DC | PRN
  Administered 2020-06-15: 170 mL

## 2020-06-15 SURGICAL SUPPLY — 31 items
BALLN EUPHORA RX 3.75X20 (BALLOONS) ×2
BALLN SAPPHIRE 2.5X15 (BALLOONS) ×2
BALLN SAPPHIRE 3.0X20 (BALLOONS) ×2
BALLN ~~LOC~~ EMERGE MR 3.75X20 (BALLOONS) ×2
BALLN ~~LOC~~ EMERGE MR 4.0X12 (BALLOONS) ×2
BALLOON EUPHORA RX 3.75X20 (BALLOONS) ×1 IMPLANT
BALLOON SAPPHIRE 2.5X15 (BALLOONS) ×1 IMPLANT
BALLOON SAPPHIRE 3.0X20 (BALLOONS) ×1 IMPLANT
BALLOON ~~LOC~~ EMERGE MR 3.75X20 (BALLOONS) ×1 IMPLANT
BALLOON ~~LOC~~ EMERGE MR 4.0X12 (BALLOONS) ×1 IMPLANT
CATH INFINITI 5FR JL4 (CATHETERS) ×2 IMPLANT
CATH LAUNCHER 6FR AL1 (CATHETERS) ×2 IMPLANT
CATH TELESCOPE 6F GEC (CATHETERS) ×2 IMPLANT
CATH VISTA GUIDE 6FR AL1 (CATHETERS) ×2 IMPLANT
CATH VISTA GUIDE 6FR AL2 (CATHETERS) ×2 IMPLANT
CATH VISTA GUIDE 6FR AR2 (CATHETERS) ×2 IMPLANT
CATH VISTA GUIDE 6FR XBRCA (CATHETERS) ×2 IMPLANT
CATHETER LAUNCHER 6FR AL1 (CATHETERS) ×4
DEVICE CONTINUOUS FLUSH (MISCELLANEOUS) ×2 IMPLANT
ELECT DEFIB PAD ADLT CADENCE (PAD) ×2 IMPLANT
GUIDE CATH MACH 1 7F RC3.5SC (CATHETERS) ×2 IMPLANT
KIT ENCORE 26 ADVANTAGE (KITS) ×2 IMPLANT
KIT HEART LEFT (KITS) ×2 IMPLANT
PACK CARDIAC CATHETERIZATION (CUSTOM PROCEDURE TRAY) ×2 IMPLANT
SHEATH PINNACLE 6F 10CM (SHEATH) ×2 IMPLANT
SHEATH PINNACLE 7F 10CM (SHEATH) ×2 IMPLANT
STENT RESOLUTE ONYX 3.5X34 (Permanent Stent) ×2 IMPLANT
TRANSDUCER W/STOPCOCK (MISCELLANEOUS) ×2 IMPLANT
TUBING CIL FLEX 10 FLL-RA (TUBING) ×2 IMPLANT
WIRE ASAHI PROWATER 180CM (WIRE) ×2 IMPLANT
WIRE EMERALD 3MM-J .035X150CM (WIRE) ×2 IMPLANT

## 2020-06-15 NOTE — Progress Notes (Signed)
Site area: Right groin a 6 french arterial sheath was  removed  Site Prior to Removal:  Level 0  Pressure Applied For 20 MINUTES    Bedrest Beginning at 1445p X 4 hours  Manual:   Yes.    Patient Status During Pull:  stable  Post Pull Groin Site:  Level 0  Post Pull Instructions Given:  Yes.    Post Pull Pulses Present:  Yes.    Dressing Applied:  Yes.    Comments:

## 2020-06-15 NOTE — Interval H&P Note (Signed)
Cath Lab Visit (complete for each Cath Lab visit)  Clinical Evaluation Leading to the Procedure:   ACS: No.  Non-ACS:    Anginal Classification: CCS Chang  Anti-ischemic medical therapy: No Therapy  Non-Invasive Test Results: No non-invasive testing performed  Prior CABG: No previous CABG      History and Physical Interval Note:  06/15/2020 3:18 PM  [name]  has presented today for surgery, with the diagnosis of cad.  The various methods of treatment have been discussed with the patient and family. After consideration of risks, benefits and other options for treatment, the patient has consented to  Procedure(s): CORONARY STENT INTERVENTION (N/A) CORONARY ANGIOGRAPHY (N/A) as a surgical intervention.  The patient's history has been reviewed, patient examined, no change in status, stable for surgery.  I have reviewed the patient's chart and labs.  Questions were answered to the patient's satisfaction.     David Chang

## 2020-06-16 ENCOUNTER — Encounter (HOSPITAL_COMMUNITY): Payer: Self-pay | Admitting: Interventional Cardiology

## 2020-06-16 ENCOUNTER — Ambulatory Visit: Payer: BC Managed Care – PPO | Admitting: Nurse Practitioner

## 2020-06-16 DIAGNOSIS — N184 Chronic kidney disease, stage 4 (severe): Secondary | ICD-10-CM | POA: Diagnosis not present

## 2020-06-16 DIAGNOSIS — Z8249 Family history of ischemic heart disease and other diseases of the circulatory system: Secondary | ICD-10-CM | POA: Diagnosis not present

## 2020-06-16 DIAGNOSIS — Z8616 Personal history of COVID-19: Secondary | ICD-10-CM | POA: Diagnosis not present

## 2020-06-16 DIAGNOSIS — I209 Angina pectoris, unspecified: Secondary | ICD-10-CM | POA: Diagnosis not present

## 2020-06-16 DIAGNOSIS — I130 Hypertensive heart and chronic kidney disease with heart failure and stage 1 through stage 4 chronic kidney disease, or unspecified chronic kidney disease: Secondary | ICD-10-CM | POA: Diagnosis not present

## 2020-06-16 DIAGNOSIS — Z885 Allergy status to narcotic agent status: Secondary | ICD-10-CM | POA: Diagnosis not present

## 2020-06-16 DIAGNOSIS — I5042 Chronic combined systolic (congestive) and diastolic (congestive) heart failure: Secondary | ICD-10-CM | POA: Diagnosis not present

## 2020-06-16 DIAGNOSIS — Z96652 Presence of left artificial knee joint: Secondary | ICD-10-CM | POA: Diagnosis not present

## 2020-06-16 DIAGNOSIS — I255 Ischemic cardiomyopathy: Secondary | ICD-10-CM | POA: Diagnosis not present

## 2020-06-16 DIAGNOSIS — I25110 Atherosclerotic heart disease of native coronary artery with unstable angina pectoris: Secondary | ICD-10-CM | POA: Diagnosis not present

## 2020-06-16 DIAGNOSIS — E785 Hyperlipidemia, unspecified: Secondary | ICD-10-CM | POA: Diagnosis not present

## 2020-06-16 DIAGNOSIS — Z955 Presence of coronary angioplasty implant and graft: Secondary | ICD-10-CM | POA: Diagnosis not present

## 2020-06-16 LAB — BASIC METABOLIC PANEL
Anion gap: 7 (ref 5–15)
BUN: 26 mg/dL — ABNORMAL HIGH (ref 8–23)
CO2: 24 mmol/L (ref 22–32)
Calcium: 9 mg/dL (ref 8.9–10.3)
Chloride: 107 mmol/L (ref 98–111)
Creatinine, Ser: 1.4 mg/dL — ABNORMAL HIGH (ref 0.61–1.24)
GFR, Estimated: 57 mL/min — ABNORMAL LOW (ref >60)
Glucose, Bld: 97 mg/dL (ref 70–99)
Potassium: 4.6 mmol/L (ref 3.5–5.1)
Sodium: 138 mmol/L (ref 135–145)

## 2020-06-16 LAB — CBC
HCT: 36.4 % — ABNORMAL LOW (ref 39.0–52.0)
Hemoglobin: 12.9 g/dL — ABNORMAL LOW (ref 13.0–17.0)
MCH: 32.1 pg (ref 26.0–34.0)
MCHC: 35.4 g/dL (ref 30.0–36.0)
MCV: 90.5 fL (ref 80.0–100.0)
Platelets: 137 10*3/uL — ABNORMAL LOW (ref 150–400)
RBC: 4.02 MIL/uL — ABNORMAL LOW (ref 4.22–5.81)
RDW: 12.3 % (ref 11.5–15.5)
WBC: 8.8 10*3/uL (ref 4.0–10.5)
nRBC: 0 % (ref 0.0–0.2)

## 2020-06-16 NOTE — Progress Notes (Signed)
Progress Note  Patient Name: David Chang Date of Encounter: 06/16/2020  [facility] Cardiologist: Sinclair Grooms, MD   Subjective   No chest pain. NO events overnight.   Inpatient Medications    Scheduled Meds: . amLODipine  10 mg Oral Daily  . aspirin EC  81 mg Oral Daily  . clopidogrel  75 mg Oral Daily  . heparin  5,000 Units Subcutaneous Q8H  . metoprolol  200 mg Oral Daily  . omega-3 acid ethyl esters  1,000 mg Oral Daily  . rosuvastatin  20 mg Oral Daily  . sodium chloride flush  3 mL Intravenous Q12H   Continuous Infusions: . sodium chloride    . sodium chloride 1 mL/kg/hr (06/15/20 2103)   PRN Meds: sodium chloride, acetaminophen, nitroGLYCERIN, ondansetron (ZOFRAN) IV, pantoprazole, polyvinyl alcohol, sodium chloride flush, traZODone   Vital Signs    Vitals:   06/15/20 2000 06/15/20 2352 06/16/20 0426 06/16/20 0727  BP: (!) 129/91 109/73 101/72 102/80  Pulse: 87 75 61 72  Resp: '17 16 14 17  '$ Temp: 98.3 F (36.8 C) 97.9 F (36.6 C) 98 F (36.7 C) 98 F (36.7 C)  TempSrc: Oral Oral Oral Oral  SpO2: 98% 93% 94% 94%  Weight:      Height:        Intake/Output Summary (Last 24 hours) at 06/16/2020 0821 Last data filed at 06/16/2020 0500 Gross per 24 hour  Intake 120 ml  Output 1375 ml  Net -1255 ml   Last 3 Weights 06/15/2020 05/31/2020 05/07/2020  Weight (lbs) 215 lb 216 lb 220 lb  Weight (kg) 97.523 kg 97.977 kg 99.791 kg      Telemetry    Sinus - Personally Reviewed  ECG    NSR, first degree AV block- Personally Reviewed  Physical Exam   GEN: No acute distress.   Neck: No JVD Cardiac: RRR, no murmurs, rubs, or gallops.  Respiratory: Clear to auscultation bilaterally. GI: Soft, nontender, non-distended  MS: No edema; No deformity. Neuro:  Nonfocal  Psych: Normal affect   Labs    High Sensitivity Troponin:  No results for input(s): TROPONINIHS in the last 720 hours.    Chemistry Recent Labs  Lab 06/10/20 1313  06/10/20 1313 06/15/20 0807 06/15/20 1941 06/15/20 2100 06/16/20 0034  NA 140  --  138 137  --  138  K CANCELED  --  4.6 4.0  --  4.6  CL 102  --  106 104  --  107  CO2 22  --  25 25  --  24  GLUCOSE CANCELED  --  103* 126*  --  97  BUN 41*  --  28* 26*  --  26*  CREATININE 1.91*  --  1.52* 1.38* 1.51* 1.40*  CALCIUM 9.9  --  9.2 9.2  --  9.0  GFRNONAA 37*   < > 52* 58* 52* 57*  GFRAA 43*  --   --   --   --   --   ANIONGAP  --   --  7 8  --  7   < > = values in this interval not displayed.     Hematology Recent Labs  Lab 06/10/20 1313 06/15/20 2100 06/16/20 0034  WBC 8.7 9.5 8.8  RBC 4.66 4.49 4.02*  HGB 14.7 14.2 12.9*  HCT 42.5 40.7 36.4*  MCV 91 90.6 90.5  MCH 31.5 31.6 32.1  MCHC 34.6 34.9 35.4  RDW 12.5 12.3 12.3  PLT 141*  152 137*    BNPNo results for input(s): BNP, PROBNP in the last 168 hours.   DDimer No results for input(s): DDIMER in the last 168 hours.   Radiology    CARDIAC CATHETERIZATION  Result Date: 06/15/2020  Successful stenting of the mid to distal RCA that included the acute bend in the mid RCA using a 34 x 3.5 mm Onyx postdilated to 4.0 mm in diameter.  85% stenosis was reduced to 0% with TIMI grade III flow.  A guide catheter extension was needed to be able to deliver the stent due to a "shepherd's crook "margin.  TIMI grade III flow was noted. RECOMMENDATIONS:  Aspirin and Plavix for 1 year.  Then discontinue aspirin and continue Plavix as before.  Overnight stay due to 7 French sheath which would increase the risk of post procedure bleeding.   Cardiac Studies     Patient Profile     60 y.o. male with CAD, admitted post staged PCI with stenting of the RCA  Assessment & Plan    1. CAD with unstable angina: Doing well this am. No chest pain. Continue ASA and Plavix for one year. Continue beta blocker and statin.  Discharge home today. Follow up post cath with Dr. Tamala Julian or office APP  For questions or updates, please contact [place] Please consult [email or web address] for contact info under        Signed, Lauree Chandler, MD  06/16/2020, 8:21 AM

## 2020-06-16 NOTE — Progress Notes (Signed)
Removed PIV access and Patient & wife received discharge instructions. Patient took his all belongings. HS [employer]

## 2020-06-16 NOTE — Progress Notes (Signed)
CARDIOLOGY OFFICE NOTE  Date:  06/30/2020    [name] Date of Birth: [birth year] Medical Record [id number]  PCP:  Gweneth Fritter, FNP  Cardiologist:  Tamala Julian    Chief Complaint  Patient presents with  . Hospitalization Follow-up    Seen for Dr. Tamala Julian    History of Present Illness: David Chang is a 60 y.o. male who presents today for a follow up visit. Seen for Dr. Tamala Julian.   He has a history of known CAD with multivessel DES including RCA, LCX and LAD - all in 2015. Other issues include previously not treated HLD due to musculoskeletal syndrome, HTN, LV dysfunction with EF of 45 to 50%.   Last seen by Dr. Tamala Julian about a month ago - had had abnormal Myoview - when seen had no further chest pain but had stopped exercising - does not want CABG. Angiogram reviewed - has total occlusion of a stent placed in his circumflex from 2016.  It depends on collaterals from the right coronary.  The right coronary is relatively small.  He was opting for high risk PCI - technically challenging.  Procedure rescheduled twice -  first instance had to do with insurance and then had COVID 19 infection.  He has had PCI and has done well - see report below.    Comes in today. Here alone. He feels great - says he felt better the following day - has more energy. No chest pain. Tolerating his medicines. Is off ARB due to CKD. Now taking Crestor - tolerating ok so far. He is very happy with how he is doing. Reports no problem with his groin.   Past Medical History:  Diagnosis Date  . Arthritis   . Coronary artery disease   . Hyperlipidemia LDL goal <70 04/10/2015  . Hypertension   . Ischemic cardiomyopathy 04/10/2015  . S/P angioplasty with stent to mLAD DES, pRCA DES, mLCX DES 04/08/15  04/10/2015    Past Surgical History:  Procedure Laterality Date  . ACHILLES TENDON SURGERY Left 12/06/2017   Procedure: Complete Left Achilles tendon repair; dermaspan graf, application of cast;  Surgeon:  Latanya Maudlin, MD;  Location: [facility];  Service: Orthopedics;  Laterality: Left;  . achillies repair     and medial gastrocnemius tear repair  . CARDIAC CATHETERIZATION N/A 04/08/2015   Procedure: Left Heart Cath and Coronary Angiography;  Surgeon: Leonie Man, MD;  Location: [facility];  Service: Cardiovascular;  Laterality: N/A;  . CORONARY ANGIOGRAPHY N/A 06/15/2020   Procedure: CORONARY ANGIOGRAPHY;  Surgeon: Belva Crome, MD;  Location: [facility];  Service: Cardiovascular;  Laterality: N/A;  . CORONARY STENT INTERVENTION N/A 06/15/2020   Procedure: CORONARY STENT INTERVENTION;  Surgeon: Belva Crome, MD;  Location: [facility];  Service: Cardiovascular;  Laterality: N/A;  . KNEE SURGERY Left   . LEFT HEART CATH AND CORONARY ANGIOGRAPHY N/A 05/07/2020   Procedure: LEFT HEART CATH AND CORONARY ANGIOGRAPHY;  Surgeon: Belva Crome, MD;  Location: [facility];  Service: Cardiovascular;  Laterality: N/A;  . left tricep surgery      Right tricep repair also  . TOTAL KNEE ARTHROPLASTY Left 04/24/2016   Procedure: LEFT TOTAL KNEE ARTHROPLASTY;  Surgeon: Gaynelle Arabian, MD;  Location: [facility];  Service: Orthopedics;  Laterality: Left;     Medications: Current Meds  Medication Sig  . amLODipine (NORVASC) 10 MG tablet TAKE 1 TABLET BY MOUTH ONCE DAILY  .  aspirin EC 81 MG tablet Take 81 mg by mouth daily.  . clopidogrel (PLAVIX) 75 MG tablet TAKE ONE TABLET BY MOUTH DAILY  . Glycerin-Hypromellose-PEG 400 (DRY EYE RELIEF DROPS) 0.2-0.2-1 % SOLN Place 1 drop into both eyes daily as needed (Dry eye).  . metoprolol (TOPROL XL) 200 MG 24 hr tablet Take 1 tablet (200 mg total) by mouth daily.  . milk thistle 175 MG tablet Take 175 mg by mouth daily.  . nitroGLYCERIN (NITROSTAT) 0.4 MG SL tablet Place 1 tablet (0.4 mg total) under the tongue every 5 (five) minutes as needed for chest pain.  . Nutritional Supplements (FRUIT & VEGETABLE DAILY PO) Take 6 tablets by  mouth daily at 12 noon. Balance Nature  3 Fruit 3 Vegetable  . Omega-3 Fatty Acids (FISH OIL) 1000 MG CAPS Take 1,000 mg by mouth daily.  . pantoprazole (PROTONIX) 20 MG tablet Take 20 mg by mouth daily as needed for heartburn or indigestion.  . rosuvastatin (CRESTOR) 20 MG tablet Take 1 tablet (20 mg total) by mouth daily.  . traZODone (DESYREL) 50 MG tablet Take 50-100 mg by mouth at bedtime as needed for sleep.     Allergies: Allergies  Allergen Reactions  . Tramadol Other (See Comments)    Gi upset    Social History: The patient  reports that he has never smoked. He has never used smokeless tobacco. He reports current alcohol use. He reports that he does not use drugs.   Family History: The patient's family history includes CVA in an other family member; Cancer (age of onset: 23) in his father; Heart attack (age of onset: 68) in his paternal grandfather; Hypertension in his sister.   Review of Systems: Please see the history of present illness.   All other systems are reviewed and negative.   Physical Exam: VS:  BP 110/74   Pulse 74   Ht 6' (1.829 m)   Wt 216 lb (98 kg)   SpO2 95%   BMI 29.29 kg/m  .  BMI Body mass index is 29.29 kg/m.  Wt Readings from Last 3 Encounters:  06/30/20 216 lb (98 kg)  06/15/20 215 lb (97.5 kg)  05/31/20 216 lb (98 kg)    General: Pleasant. Alert and in no acute distress.   HEENT: Normal.  Neck: Supple, no JVD, carotid bruits, or masses noted.  Cardiac: Regular rate and rhythm. No murmurs, rubs, or gallops. No edema.  Respiratory:  Lungs are clear to auscultation bilaterally with normal work of breathing.  GI: Soft and nontender.  MS: No deformity or atrophy. Gait and ROM intact.  Skin: Warm and dry. Color is normal.  Neuro:  Strength and sensation are intact and no gross focal deficits noted.  Psych: Alert, appropriate and with normal affect.   LABORATORY DATA:  EKG:  EKG is not ordered today.    Lab Results  Component  Value Date   WBC 8.8 06/16/2020   HGB 12.9 (L) 06/16/2020   HCT 36.4 (L) 06/16/2020   PLT 137 (L) 06/16/2020   GLUCOSE 97 06/16/2020   CHOL 251 (H) 04/08/2015   TRIG 124 04/08/2015   HDL 23 (L) 04/08/2015   LDLCALC 203 (H) 04/08/2015   ALT 37 12/05/2017   AST 37 12/05/2017   NA 138 06/16/2020   K 4.6 06/16/2020   CL 107 06/16/2020   CREATININE 1.40 (H) 06/16/2020   BUN 26 (H) 06/16/2020   CO2 24 06/16/2020   TSH 2.732 04/08/2015   INR 0.92  12/05/2017   HGBA1C 5.6 04/08/2015       BNP (last 3 results) No results for input(s): BNP in the last 8760 hours.  ProBNP (last 3 results) No results for input(s): PROBNP in the last 8760 hours.   Other Studies Reviewed Today:  CORONARY ANGIOGRAPHY 05/2020  CORONARY STENT INTERVENTION    PCI Conclusion 1/22   Successful stenting of the mid to distal RCA that included the acute bend in the mid RCA using a 34 x 3.5 mm Onyx postdilated to 4.0 mm in diameter.  85% stenosis was reduced to 0% with TIMI grade III flow.  A guide catheter extension was needed to be able to deliver the stent due to a "shepherd's crook "margin.  TIMI grade III flow was noted.  RECOMMENDATIONS:   Aspirin and Plavix for 1 year.  Then discontinue aspirin and continue Plavix as before.  Overnight stay due to 7 French sheath which would increase the risk of post procedure bleeding.    LEFT HEART CATH AND CORONARY ANGIOGRAPHY 04/2020    Conclusion   Total occlusion of the mid circumflex stent  Widely patent mid LAD stent.  Moderate diffuse disease throughout the LAD.  Widely patent left main.  Dominant right coronary with shepherd's crook, and significant angulation in the mid to distal segment followed by an 85% focal stenosis.  Unable to achieve coaxial support in the right coronary with an extra-support guide catheter.  Because of CKD, we decided to perform PCI as a stand-alone procedure from the right femoral approach at some point in the  next 2 weeks.  The EF is 50% and EDP is 13 mmHg.  RECOMMENDATIONS:   Plan high risk PCI via right femoral approach using extra-support guide catheter such as Amplatz 1 cm or 2 cm catheter and may need a guide liner to give best opportunity to that deliver the distal stent.  Continue same medication.    Return if angina at rest.  Stay below anginal threshold.  My office will arrange the repeat procedure.   Myoview Study Highlights 04/2020    Nuclear stress EF: 53%.  There was no ST segment deviation noted during stress.  Findings consistent with prior myocardial infarction with peri-infarct ischemia.  This is an intermediate risk study.  The left ventricular ejection fraction is mildly decreased (45-54%).   There is a medium size partially reversible defect in the inferior and inferolateral walls from base to apex.   At the apex, the inferior wall defect appears moderate and fixed. At the mid ventricle, the inferior wall defect is moderate and fixed and there is a moderate stress induced perfusion defect in the inferolateral wall that has complete reversibility at rest. At the base, the inferior and inferolateral segments have moderate perfusion defect with partial reversibility to mild at rest. Findings consistent with prior infarct with peri-infarct ischemia.   No ischemia noted on stress ECG.   Intermediate risk study.    Echo Study Conclusions 2016  - Procedure narrative: Transthoracic echocardiography. Image  quality was poor. The study was technically difficult, as a  result of poor acoustic windows, poor sound wave transmission,  restricted patient mobility, and body habitus.  - Left ventricle: The cavity size was normal. Wall thickness was  increased in a pattern of moderate LVH. Systolic function was  mildly reduced. The estimated ejection fraction was in the range  of 45% to 50%. Regional wall motion abnormalities cannot be  excluded.  Doppler parameters are consistent with abnormal left  ventricular relaxation (grade 1 diastolic dysfunction).  - Aortic valve: There was mild regurgitation.  - Mitral valve: There was mild regurgitation.  - Left atrium: The atrium was moderately dilated.     ASSESSMENT & PLAN:     1. CAD with recent PCI to the RCA - he is doing well - no further symptoms. Tolerating his current regimen. [place] lab today. Ok to gradually resume his exercise routine.   2. HTN - BP is ok - will follow - remains off ARB.   3. HLD - now on Crestor 20 mg - he will ask his PCP to check his lipids when he is fasting and send to Dr. Tamala Julian - he is aware that goal LDL is less than 70.   4. Chronic combined systolic and diastolic HF - last echo from 2016 with EF of 45 to 50%. He is not symptomatic - NYHA I  5. Prior COVID 19 illness - resolved.   6. CKD - rechecking lab today.    Current medicines are reviewed with the patient today.  The patient does not have concerns regarding medicines other than what has been noted above.  The following changes have been made:  See above.  Labs/ tests ordered today include:    Orders Placed This Encounter  Procedures  . Basic metabolic panel  . CBC  . Hepatic function panel     Disposition:   FU with Dr. Tamala Julian in 3 months. Reminded of importance of DAPT. Lipids will be checked by his PCP and sent to Dr. Tamala Julian for review.     Patient is agreeable to this plan and will call if any problems develop in the interim.   SignedTruitt Merle, NP  06/30/2020 2:41 PM  [facility] [address] [place], [place]  [address] Phone: [phone] Fax: [phone]

## 2020-06-16 NOTE — Progress Notes (Signed)
CARDIAC REHAB PHASE I   PRE:  Rate/Rhythm: 78 SR  BP:  Supine: 102/80  Sitting:   Standing:    SaO2: 96%RA  MODE:  Ambulation: 400 ft   POST:  Rate/Rhythm: 90 SR  BP:  Supine:   Sitting: 134/92  Standing:    SaO2: 96%RA 0805-0850 Pt walked 400 ft on RA with steady gait and no CP. Tolerated well. Reviewed importance of plavix with stent. Reviewed NTG use, walking for exercise, heart healthy food choices and CRP 2. Pt is very active and plans to go back to gym. Encouraged pt to start slowly with walking first to allow groin to heal. Will send referral letter to [facility] to meet requirement but pt will exercise on his own. Usually goes to gym 5x week. Pt voiced understanding of ed.   Graylon Good, RN BSN  06/16/2020 8:46 AM

## 2020-06-16 NOTE — Discharge Summary (Signed)
Discharge Summary    Patient ID: David Chang MRN: [id number]; DOB: July 06, 1960  Admit date: 06/15/2020 Discharge date: 06/16/2020  Primary Care Provider: Gweneth Fritter, FNP  Primary Cardiologist: Sinclair Grooms, MD  Primary Electrophysiologist:  None   Discharge Diagnoses    Principal Problem:   Angina pectoris [facility]) Active Problems:   Coronary artery disease involving native coronary artery of native heart with unstable angina pectoris ([place])   Chronic combined systolic and diastolic heart failure, NYHA class 2 ([place])   CAD (coronary artery disease), native coronary artery    Diagnostic Studies/Procedures    Cath: 06/15/20   Successful stenting of the mid to distal RCA that included the acute bend in the mid RCA using a 34 x 3.5 mm Onyx postdilated to 4.0 mm in diameter.  85% stenosis was reduced to 0% with TIMI grade III flow.  A guide catheter extension was needed to be able to deliver the stent due to a "shepherd's crook "margin.  TIMI grade III flow was noted.  RECOMMENDATIONS:   Aspirin and Plavix for 1 year.  Then discontinue aspirin and continue Plavix as before.  Overnight stay due to 7 French sheath which would increase the risk of post procedure bleeding.  Diagnostic Dominance: Right    Intervention     _____________   History of Present Illness     David Chang is a 60 y.o. male with a hx of CAD with multivessel DES including RCA, circumflex, and LADall in 2016. Hyperlipidemia not currently treated with statin therapy due to musculoskeletal syndrome, hypertension, and ischemic left ventricular dysfunction (EF 45-50%). Progression of de novo RCA CAD and Covid-19 infection.  Has cancelled high risk PCI twice. The first instance had to do with insurance.  The most recent instance was because he was COVID 19 positive.  He was now 10 days out from testing positive.  His wife developed a positive COVID test before he did in late December.   His positive test was January 1.  He was asymptomatic at the time of his office visit.  He had not had any since he stopped exercising.  He has been able to accomplish all of his activities of daily living.  Exercise however was very essential to him and he feels that a lifestyle measure has been taken away. Dr. Tamala Julian reviewed the angiogram with the patient.  He has total occlusion of a stent placed in his circumflex in 2016.  It depends on collaterals from the right coronary.  The right coronary is relatively small.  He had not needed nitroglycerin.  Dr. Tamala Julian discussed options with the patient and decision was made to bring back for outpatient cardiac cath.   Hospital Course      Underwent cardiac cath noted above with stenting of the mid to distal RCA that included the acute bend in the mid RCA using a 34 x 3.5 mm Onyx postdilated to 4.0 mm in diameter.  85% stenosis was reduced to 0% with TIMI grade III flow. Plan to continue on ASA/Plavix for at least 6 months. He was observed overnight without complications. Worked well with cardiac rehab the following day. Continued on home medications with the execption of holding losartan as his Cr was 1.4 post cath. Advised to monitor blood pressures at home. Will need recheck of BMET at follow up to resume losartan if Cr remains stable.     Did the patient have an acute coronary syndrome (MI, NSTEMI, STEMI, etc) this  admission?:  Yes                               AHA/ACC Clinical Performance & Quality Measures: 1. Aspirin prescribed? - Yes 2. ADP Receptor Inhibitor (Plavix/Clopidogrel, Brilinta/Ticagrelor or Effient/Prasugrel) prescribed (includes medically managed patients)? - Yes 3. Beta Blocker prescribed? - Yes 4. High Intensity Statin (Lipitor 40-'80mg'$  or Crestor 20-'40mg'$ ) prescribed? - Yes 5. EF assessed during THIS hospitalization? - No - LV gram 12/21 6. For EF <40%, was ACEI/ARB prescribed? - Not Applicable (EF >/= [id number]) 7. For EF <40%,  Aldosterone Antagonist (Spironolactone or Eplerenone) prescribed? - Not Applicable (EF >/= [id number]) 8. Cardiac Rehab Phase II ordered (including medically managed patients)? - Yes       _____________  Discharge Vitals Blood pressure 108/67, pulse 77, temperature 97.7 F (36.5 C), temperature source Oral, resp. rate 18, height 6' (1.829 m), weight 97.5 kg, SpO2 96 %.  Filed Weights   06/15/20 0732  Weight: 97.5 kg    Labs & Radiologic Studies    CBC Recent Labs    06/15/20 2100 06/16/20 0034  WBC 9.5 8.8  HGB 14.2 12.9*  HCT 40.7 36.4*  MCV 90.6 90.5  PLT 152 0000000*   Basic Metabolic Panel Recent Labs    06/15/20 1941 06/15/20 2100 06/16/20 0034  NA 137  --  138  K 4.0  --  4.6  CL 104  --  107  CO2 25  --  24  GLUCOSE 126*  --  97  BUN 26*  --  26*  CREATININE 1.38* 1.51* 1.40*  CALCIUM 9.2  --  9.0   Liver Function Tests No results for input(s): AST, ALT, ALKPHOS, BILITOT, PROT, ALBUMIN in the last 72 hours. No results for input(s): LIPASE, AMYLASE in the last 72 hours. High Sensitivity Troponin:   No results for input(s): TROPONINIHS in the last 720 hours.  BNP Invalid input(s): POCBNP D-Dimer No results for input(s): DDIMER in the last 72 hours. Hemoglobin A1C No results for input(s): HGBA1C in the last 72 hours. Fasting Lipid Panel No results for input(s): CHOL, HDL, LDLCALC, TRIG, CHOLHDL, LDLDIRECT in the last 72 hours. Thyroid Function Tests No results for input(s): TSH, T4TOTAL, T3FREE, THYROIDAB in the last 72 hours.  Invalid input(s): FREET3 _____________  CARDIAC CATHETERIZATION  Result Date: 06/15/2020  Successful stenting of the mid to distal RCA that included the acute bend in the mid RCA using a 34 x 3.5 mm Onyx postdilated to 4.0 mm in diameter.  85% stenosis was reduced to 0% with TIMI grade III flow.  A guide catheter extension was needed to be able to deliver the stent due to a "shepherd's crook "margin.  TIMI grade III flow was noted.  RECOMMENDATIONS:  Aspirin and Plavix for 1 year.  Then discontinue aspirin and continue Plavix as before.  Overnight stay due to 7 French sheath which would increase the risk of post procedure bleeding.  Disposition   Pt is being discharged home today in good condition.  Follow-up Plans & Appointments     Follow-up Information    Burtis Junes, NP Follow up on 06/30/2020.   Specialties: Nurse Practitioner, Interventional Cardiology, Cardiology, Radiology Why: at 2:15pm for your follow up appt Contact information: [address]  [place] [address] [phone]              Discharge Instructions    Amb Referral to Cardiac Rehabilitation  Complete by: As directed    Referring to [place] CRP 2   Diagnosis: Coronary Stents   After initial evaluation and assessments completed: Virtual Based Care may be provided alone or in conjunction with Phase 2 Cardiac Rehab based on patient barriers.: Yes   Diet - low sodium heart healthy   Complete by: As directed    Discharge instructions   Complete by: As directed    Groin Site Care Refer to this sheet in the next few weeks. These instructions provide you with information on caring for yourself after your procedure. Your caregiver may also give you more specific instructions. Your treatment has been planned according to current medical practices, but problems sometimes occur. Call your caregiver if you have any problems or questions after your procedure. HOME CARE INSTRUCTIONS You may shower 24 hours after the procedure. Remove the bandage (dressing) and gently wash the site with plain soap and water. Gently pat the site dry.  Do not apply powder or lotion to the site.  Do not sit in a bathtub, swimming pool, or whirlpool for 5 to 7 days.  No bending, squatting, or lifting anything over 10 pounds (4.5 kg) as directed by your caregiver.  Inspect the site at least twice daily.  Do not drive home if you are discharged  the same day of the procedure. Have someone else drive you.  You may drive 24 hours after the procedure unless otherwise instructed by your caregiver.  What to expect: Any bruising will usually fade within 1 to 2 weeks.  Blood that collects in the tissue (hematoma) may be painful to the touch. It should usually decrease in size and tenderness within 1 to 2 weeks.  SEEK IMMEDIATE MEDICAL CARE IF: You have unusual pain at the groin site or down the affected leg.  You have redness, warmth, swelling, or pain at the groin site.  You have drainage (other than a small amount of blood on the dressing).  You have chills.  You have a fever or persistent symptoms for more than 72 hours.  You have a fever and your symptoms suddenly get worse.  Your leg becomes pale, cool, tingly, or numb.  You have heavy bleeding from the site. Hold pressure on the site. Marland Kitchen  PLEASE DO NOT MISS ANY DOSES OF YOUR PLAVIX!!!!! Also keep a log of you blood pressures and bring back to your follow up appt. Please call the office with any questions.   Patients taking blood thinners should generally stay away from medicines like ibuprofen, Advil, Motrin, naproxen, and Aleve due to risk of stomach bleeding. You may take Tylenol as directed or talk to your primary doctor about alternatives.  PLEASE ENSURE THAT YOU DO NOT RUN OUT OF YOUR PLAVIX. This medication is very important to remain on for at least one year. IF you have issues obtaining this medication due to cost please CALL the office 3-5 business days prior to running out in order to prevent missing doses of this medication.   Increase activity slowly   Complete by: As directed       Discharge Medications   Allergies as of 06/16/2020      Reactions   Tramadol Other (See Comments)   Gi upset      Medication List    STOP taking these medications   losartan 100 MG tablet Commonly known as: COZAAR     TAKE these medications   amLODipine 10 MG tablet Commonly  known as: NORVASC TAKE 1  TABLET BY MOUTH ONCE DAILY   aspirin EC 81 MG tablet Take 81 mg by mouth daily.   clopidogrel 75 MG tablet Commonly known as: PLAVIX TAKE ONE TABLET BY MOUTH DAILY   Dry Eye Relief Drops 0.2-0.2-1 % Soln Generic drug: Glycerin-Hypromellose-PEG 400 Place 1 drop into both eyes daily as needed (Dry eye).   Fish Oil 1000 MG Caps Take 1,000 mg by mouth daily.   FRUIT & VEGETABLE DAILY PO Take 6 tablets by mouth daily at 12 noon. Balance Nature  3 Fruit 3 Vegetable   metoprolol 200 MG 24 hr tablet Commonly known as: Toprol XL Take 1 tablet (200 mg total) by mouth daily.   milk thistle 175 MG tablet Take 175 mg by mouth daily.   nitroGLYCERIN 0.4 MG SL tablet Commonly known as: NITROSTAT Place 1 tablet (0.4 mg total) under the tongue every 5 (five) minutes as needed for chest pain.   pantoprazole 20 MG tablet Commonly known as: PROTONIX Take 20 mg by mouth daily as needed for heartburn or indigestion.   rosuvastatin 20 MG tablet Commonly known as: CRESTOR Take 1 tablet (20 mg total) by mouth daily.   traZODone 50 MG tablet Commonly known as: DESYREL Take 50-100 mg by mouth at bedtime as needed for sleep.        Outstanding Labs/Studies   BMET at follow up  Duration of Discharge Encounter   Greater than 30 minutes including physician time.  Signed, Reino Bellis, NP 06/16/2020, 11:30 AM

## 2020-06-18 ENCOUNTER — Telehealth (HOSPITAL_COMMUNITY): Payer: Self-pay

## 2020-06-18 NOTE — Telephone Encounter (Signed)
Per phase I, fax cardiac rehab referral to [facility] cardiac rehab.

## 2020-06-27 NOTE — Progress Notes (Signed)
Error

## 2020-06-30 ENCOUNTER — Ambulatory Visit (INDEPENDENT_AMBULATORY_CARE_PROVIDER_SITE_OTHER): Payer: BC Managed Care – PPO | Admitting: Nurse Practitioner

## 2020-06-30 ENCOUNTER — Other Ambulatory Visit: Payer: Self-pay

## 2020-06-30 ENCOUNTER — Encounter: Payer: Self-pay | Admitting: Nurse Practitioner

## 2020-06-30 VITALS — BP 110/74 | HR 74 | Ht 72.0 in | Wt 216.0 lb

## 2020-06-30 DIAGNOSIS — I5042 Chronic combined systolic (congestive) and diastolic (congestive) heart failure: Secondary | ICD-10-CM | POA: Diagnosis not present

## 2020-06-30 DIAGNOSIS — I10 Essential (primary) hypertension: Secondary | ICD-10-CM | POA: Diagnosis not present

## 2020-06-30 DIAGNOSIS — I25110 Atherosclerotic heart disease of native coronary artery with unstable angina pectoris: Secondary | ICD-10-CM | POA: Diagnosis not present

## 2020-06-30 DIAGNOSIS — E785 Hyperlipidemia, unspecified: Secondary | ICD-10-CM | POA: Diagnosis not present

## 2020-06-30 NOTE — Patient Instructions (Addendum)
After Visit Summary:  We will be checking the following labs today - BMET, CBC and HPF  Get your lipids checked in about a week or so with your PCP and forward to Dr. Tamala Julian.   Medication Instructions:    Continue with your current medicines.    If you need a refill on your cardiac medications before your next appointment, please call your pharmacy.     Testing/Procedures To Be Arranged:  N/A  Follow-Up:   See Dr. Tamala Julian in about 3 months.    At [facility], you and your health needs are our priority.  As part of our continuing mission to provide you with exceptional heart care, we have created designated Provider Care Teams.  These Care Teams include your primary Cardiologist (physician) and Advanced Practice Providers (APPs -  Physician Assistants and Nurse Practitioners) who all work together to provide you with the care you need, when you need it.  Special Instructions:  . Stay safe, wash your hands for at least 20 seconds and wear a mask when needed.  . It was good to talk with you today.    Call the [place] office at [phone] if you have any questions, problems or concerns.

## 2020-07-01 LAB — BASIC METABOLIC PANEL
BUN/Creatinine Ratio: 18 (ref 10–24)
BUN: 31 mg/dL — ABNORMAL HIGH (ref 8–27)
CO2: 20 mmol/L (ref 20–29)
Calcium: 9.5 mg/dL (ref 8.6–10.2)
Chloride: 103 mmol/L (ref 96–106)
Creatinine, Ser: 1.7 mg/dL — ABNORMAL HIGH (ref 0.76–1.27)
GFR calc Af Amer: 49 mL/min/{1.73_m2} — ABNORMAL LOW (ref >59)
GFR calc non Af Amer: 43 mL/min/{1.73_m2} — ABNORMAL LOW (ref >59)
Glucose: 113 mg/dL — ABNORMAL HIGH (ref 65–99)
Potassium: 4.7 mmol/L (ref 3.5–5.2)
Sodium: 140 mmol/L (ref 134–144)

## 2020-07-01 LAB — CBC
Hematocrit: 38.2 % (ref 37.5–51.0)
Hemoglobin: 13.3 g/dL (ref 13.0–17.7)
MCH: 32 pg (ref 26.6–33.0)
MCHC: 34.8 g/dL (ref 31.5–35.7)
MCV: 92 fL (ref 79–97)
Platelets: 141 10*3/uL — ABNORMAL LOW (ref 150–450)
RBC: 4.16 x10E6/uL (ref 4.14–5.80)
RDW: 13.7 % (ref 11.6–15.4)
WBC: 8.2 10*3/uL (ref 3.4–10.8)

## 2020-07-01 LAB — HEPATIC FUNCTION PANEL
ALT: 42 IU/L (ref 0–44)
AST: 38 IU/L (ref 0–40)
Albumin: 4.4 g/dL (ref 3.8–4.8)
Alkaline Phosphatase: 50 IU/L (ref 44–121)
Bilirubin Total: 0.3 mg/dL (ref 0.0–1.2)
Bilirubin, Direct: 0.12 mg/dL (ref 0.00–0.40)
Total Protein: 6.2 g/dL (ref 6.0–8.5)

## 2020-07-15 DIAGNOSIS — M19012 Primary osteoarthritis, left shoulder: Secondary | ICD-10-CM | POA: Diagnosis not present

## 2020-07-23 ENCOUNTER — Other Ambulatory Visit: Payer: Self-pay | Admitting: *Deleted

## 2020-07-23 DIAGNOSIS — I25110 Atherosclerotic heart disease of native coronary artery with unstable angina pectoris: Secondary | ICD-10-CM

## 2020-07-23 DIAGNOSIS — E785 Hyperlipidemia, unspecified: Secondary | ICD-10-CM

## 2020-07-23 DIAGNOSIS — I10 Essential (primary) hypertension: Secondary | ICD-10-CM

## 2020-07-27 ENCOUNTER — Other Ambulatory Visit: Payer: BC Managed Care – PPO

## 2020-07-28 ENCOUNTER — Other Ambulatory Visit: Payer: Self-pay

## 2020-07-28 ENCOUNTER — Other Ambulatory Visit: Payer: BC Managed Care – PPO

## 2020-07-28 DIAGNOSIS — I10 Essential (primary) hypertension: Secondary | ICD-10-CM

## 2020-07-28 DIAGNOSIS — I25110 Atherosclerotic heart disease of native coronary artery with unstable angina pectoris: Secondary | ICD-10-CM | POA: Diagnosis not present

## 2020-07-28 DIAGNOSIS — E785 Hyperlipidemia, unspecified: Secondary | ICD-10-CM | POA: Diagnosis not present

## 2020-07-28 LAB — BASIC METABOLIC PANEL
BUN/Creatinine Ratio: 10 (ref 10–24)
BUN: 12 mg/dL (ref 8–27)
CO2: 24 mmol/L (ref 20–29)
Calcium: 9.4 mg/dL (ref 8.6–10.2)
Chloride: 102 mmol/L (ref 96–106)
Creatinine, Ser: 1.22 mg/dL (ref 0.76–1.27)
Glucose: 85 mg/dL (ref 65–99)
Potassium: 4.5 mmol/L (ref 3.5–5.2)
Sodium: 138 mmol/L (ref 134–144)
eGFR: 67 mL/min/{1.73_m2} (ref >59)

## 2020-07-28 LAB — LIPID PANEL
Chol/HDL Ratio: 3.1 ratio (ref 0.0–5.0)
Cholesterol, Total: 125 mg/dL (ref 100–199)
HDL: 40 mg/dL (ref >39)
LDL Chol Calc (NIH): 69 mg/dL (ref 0–99)
Triglycerides: 80 mg/dL (ref 0–149)
VLDL Cholesterol Cal: 16 mg/dL (ref 5–40)

## 2020-07-28 LAB — HEPATIC FUNCTION PANEL
ALT: 20 IU/L (ref 0–44)
AST: 25 IU/L (ref 0–40)
Albumin: 3.9 g/dL (ref 3.8–4.8)
Alkaline Phosphatase: 49 IU/L (ref 44–121)
Bilirubin Total: 0.5 mg/dL (ref 0.0–1.2)
Bilirubin, Direct: 0.18 mg/dL (ref 0.00–0.40)
Total Protein: 5.9 g/dL — ABNORMAL LOW (ref 6.0–8.5)

## 2020-09-06 DIAGNOSIS — F5101 Primary insomnia: Secondary | ICD-10-CM | POA: Diagnosis not present

## 2020-09-06 DIAGNOSIS — Z0000 Encounter for general adult medical examination without abnormal findings: Secondary | ICD-10-CM | POA: Diagnosis not present

## 2020-09-06 DIAGNOSIS — K219 Gastro-esophageal reflux disease without esophagitis: Secondary | ICD-10-CM | POA: Diagnosis not present

## 2020-09-06 DIAGNOSIS — I10 Essential (primary) hypertension: Secondary | ICD-10-CM | POA: Diagnosis not present

## 2020-09-06 DIAGNOSIS — E782 Mixed hyperlipidemia: Secondary | ICD-10-CM | POA: Diagnosis not present

## 2020-10-13 DIAGNOSIS — M19012 Primary osteoarthritis, left shoulder: Secondary | ICD-10-CM | POA: Diagnosis not present

## 2020-10-15 ENCOUNTER — Ambulatory Visit: Payer: BC Managed Care – PPO | Admitting: Interventional Cardiology

## 2020-10-15 ENCOUNTER — Ambulatory Visit: Payer: BC Managed Care – PPO | Admitting: Cardiology

## 2020-10-21 DIAGNOSIS — S76311A Strain of muscle, fascia and tendon of the posterior muscle group at thigh level, right thigh, initial encounter: Secondary | ICD-10-CM | POA: Diagnosis not present

## 2020-11-11 ENCOUNTER — Ambulatory Visit: Payer: BC Managed Care – PPO | Admitting: Family

## 2021-01-17 DIAGNOSIS — M19012 Primary osteoarthritis, left shoulder: Secondary | ICD-10-CM | POA: Diagnosis not present

## 2021-02-04 DIAGNOSIS — H16121 Filamentary keratitis, right eye: Secondary | ICD-10-CM | POA: Diagnosis not present

## 2021-02-18 DIAGNOSIS — H16121 Filamentary keratitis, right eye: Secondary | ICD-10-CM | POA: Diagnosis not present

## 2021-03-08 DIAGNOSIS — E782 Mixed hyperlipidemia: Secondary | ICD-10-CM | POA: Diagnosis not present

## 2021-03-08 DIAGNOSIS — I25110 Atherosclerotic heart disease of native coronary artery with unstable angina pectoris: Secondary | ICD-10-CM | POA: Diagnosis not present

## 2021-03-08 DIAGNOSIS — I10 Essential (primary) hypertension: Secondary | ICD-10-CM | POA: Diagnosis not present

## 2021-03-08 DIAGNOSIS — I5042 Chronic combined systolic (congestive) and diastolic (congestive) heart failure: Secondary | ICD-10-CM | POA: Diagnosis not present

## 2021-03-08 DIAGNOSIS — I130 Hypertensive heart and chronic kidney disease with heart failure and stage 1 through stage 4 chronic kidney disease, or unspecified chronic kidney disease: Secondary | ICD-10-CM | POA: Diagnosis not present

## 2021-03-08 DIAGNOSIS — K219 Gastro-esophageal reflux disease without esophagitis: Secondary | ICD-10-CM | POA: Diagnosis not present

## 2021-03-22 DIAGNOSIS — M19012 Primary osteoarthritis, left shoulder: Secondary | ICD-10-CM | POA: Diagnosis not present

## 2021-03-31 DIAGNOSIS — M19012 Primary osteoarthritis, left shoulder: Secondary | ICD-10-CM | POA: Diagnosis not present

## 2021-03-31 DIAGNOSIS — M25412 Effusion, left shoulder: Secondary | ICD-10-CM | POA: Diagnosis not present

## 2021-04-04 ENCOUNTER — Telehealth: Payer: Self-pay | Admitting: Interventional Cardiology

## 2021-04-04 NOTE — Telephone Encounter (Signed)
     [facility] Pre-operative Risk Assessment    Patient Name: David Chang  DOB: 1960-07-18 MRN: [id number]  HEARTCARE STAFF:  - IMPORTANT!!!!!! Under Visit Info/Reason for Call, type in Other and utilize the format Clearance MM/DD/YY or Clearance TBD. Do not use dashes or single digits. - Please review there is not already an duplicate clearance open for this procedure. - If request is for dental extraction, please clarify the # of teeth to be extracted. - If the patient is currently at the dentist's office, call Pre-Op Callback Staff (MA/nurse) to input urgent request.  - If the patient is not currently in the dentist office, please route to the Pre-Op pool.  Request for surgical clearance:  What type of surgery is being performed? Left shoulder replacement  When is this surgery scheduled? TBD  What type of clearance is required (medical clearance vs. Pharmacy clearance to hold med vs. Both)? Both  Are there any medications that need to be held prior to surgery and how long? Asprin   Practice name and name of physician performing surgery? Dr. Pamala Hurry [facility]  What is the office phone number? [phone] ext [phone]   7.   What is the office fax number? [phone]  8.   Anesthesia type (None, local, MAC, general) ? General   Angeline S Hammer 04/04/2021, 9:55 AM  _________________________________________________________________   (provider comments below)

## 2021-04-04 NOTE — Telephone Encounter (Signed)
   Name: David Chang  DOB: Jul 26, 1960  MRN: [id number]  Primary Cardiologist: Sinclair Grooms, MD  Chart reviewed as part of pre-operative protocol coverage. Because of David Chang's past medical history and time since last visit, he will require a follow-up visit in order to better assess preoperative cardiovascular risk.  He had stenting within the last 12 months therefore would suggest OV before determining plan to stop antiplatelet agents (I.e. per cath report 06/15/20, Dr. Tamala Julian recommended "Aspirin and Plavix for 1 year.  Then discontinue aspirin and continue Plavix as before." But this will need to be clarified at visit.  Pre-op covering staff: - Please schedule appointment and call patient to inform them. If patient already had an upcoming appointment within acceptable timeframe, please add "pre-op clearance" to the appointment notes so provider is aware. He does have an appt with Selinda Eon in December. - Please contact requesting surgeon's office via preferred method (i.e, phone, fax) to inform them of need for appointment prior to surgery.  As his antiplatelet plan may change as above compare to what he is on now, will hold off on routing to MD as this will need to be discussed at [place] regardless based on plan for long term DAPT.   Charlie Pitter, PA-C  04/04/2021, 2:34 PM

## 2021-04-04 NOTE — Telephone Encounter (Signed)
Pt has appt 05/04/21 with Ermalinda Barrios, PAC. Will add pre op clearance needed to appt notes. Will forward notes to [facility] for appt. Will send FYI to requesting office pt has appt .

## 2021-04-26 NOTE — Progress Notes (Addendum)
Cardiology Office Note    Date:  05/04/2021   ID:  David Chang, DOB 29-Sep-1960, MRN [id number]   PCP:  Gweneth Fritter, [facility]  Cardiologist:  Sinclair Grooms, MD   Advanced Practice Provider:  No care team member to display Electrophysiologist:  None   [phone]   Chief Complaint  Patient presents with   Follow-up    History of Present Illness:  David Chang is a 60 y.o. male history of known CAD with multivessel DES including RCA, LCX and LAD - all in 2016, abnormal myoview 05/2020 Stent mid-distal RCA,HLD, HTN, LV dysfunction with EF of 45 to 50%.    Last seen 06/2020 and doing well.  Patient on my schedule for preop clearance before undergoing left shoulder replacement by [facility].  Denies chest pain, dyspnea, dizziness, palpitations,edema. Works flipping houses. Walks 8000-12,000 steps daily. Was working out daily but hasn't been able to since shoulder injury.   Past Medical History:  Diagnosis Date   Arthritis    Coronary artery disease    Hyperlipidemia LDL goal <70 04/10/2015   Hypertension    Ischemic cardiomyopathy 04/10/2015   S/P angioplasty with stent to mLAD DES, pRCA DES, mLCX DES 04/08/15  04/10/2015    Past Surgical History:  Procedure Laterality Date   ACHILLES TENDON SURGERY Left 12/06/2017   Procedure: Complete Left Achilles tendon repair; dermaspan graf, application of cast;  Surgeon: Latanya Maudlin, MD;  Location: [facility];  Service: Orthopedics;  Laterality: Left;   achillies repair     and medial gastrocnemius tear repair   CARDIAC CATHETERIZATION N/A 04/08/2015   Procedure: Left Heart Cath and Coronary Angiography;  Surgeon: Leonie Man, MD;  Location: [place] [facility];  Service: Cardiovascular;  Laterality: N/A;   CORONARY ANGIOGRAPHY N/A 06/15/2020   Procedure: CORONARY ANGIOGRAPHY;  Surgeon: Belva Crome, MD;  Location: [place] [facility];  Service:  Cardiovascular;  Laterality: N/A;   CORONARY STENT INTERVENTION N/A 06/15/2020   Procedure: CORONARY STENT INTERVENTION;  Surgeon: Belva Crome, MD;  Location: [place] [facility];  Service: Cardiovascular;  Laterality: N/A;   KNEE SURGERY Left    LEFT HEART CATH AND CORONARY ANGIOGRAPHY N/A 05/07/2020   Procedure: LEFT HEART CATH AND CORONARY ANGIOGRAPHY;  Surgeon: Belva Crome, MD;  Location: [place] [facility];  Service: Cardiovascular;  Laterality: N/A;   left tricep surgery      Right tricep repair also   TOTAL KNEE ARTHROPLASTY Left 04/24/2016   Procedure: LEFT TOTAL KNEE ARTHROPLASTY;  Surgeon: Gaynelle Arabian, MD;  Location: [facility];  Service: Orthopedics;  Laterality: Left;    Current Medications: Current Meds  Medication Sig   amLODipine (NORVASC) 10 MG tablet TAKE 1 TABLET BY MOUTH ONCE DAILY   aspirin EC 81 MG tablet Take 81 mg by mouth daily.   chlorthalidone (HYGROTON) 25 MG tablet Take 0.5 tablets (12.5 mg total) by mouth daily.   clopidogrel (PLAVIX) 75 MG tablet TAKE ONE TABLET BY MOUTH DAILY   Glycerin-Hypromellose-PEG 400 (DRY EYE RELIEF DROPS) 0.2-0.2-1 % SOLN Place 1 drop into both eyes daily as needed (Dry eye).   metoprolol (TOPROL XL) 200 MG 24 hr tablet Take 1 tablet (200 mg total) by mouth daily.   milk thistle 175 MG tablet Take 175 mg by mouth daily.   nitroGLYCERIN (NITROSTAT) 0.4 MG SL tablet Place 1 tablet (0.4 mg total) under the tongue  every 5 (five) minutes as needed for chest pain.   Nutritional Supplements (FRUIT & VEGETABLE DAILY PO) Take 6 tablets by mouth daily at 12 noon. Balance Nature  3 Fruit 3 Vegetable   Omega-3 Fatty Acids (FISH OIL) 1000 MG CAPS Take 1,000 mg by mouth daily.   pantoprazole (PROTONIX) 20 MG tablet Take 20 mg by mouth daily as needed for heartburn or indigestion.   traZODone (DESYREL) 50 MG tablet Take 50-100 mg by mouth at bedtime as needed for sleep.     Allergies:   Tramadol   Social History   Socioeconomic History    Marital status: Married    Spouse name: Not on file   Number of children: Not on file   Years of education: Not on file   Highest education level: Not on file  Occupational History   Not on file  Tobacco Use   Smoking status: Never   Smokeless tobacco: Never  Vaping Use   Vaping Use: Never used  Substance and Sexual Activity   Alcohol use: Yes    Alcohol/week: 0.0 standard drinks    Comment: casual drinking on Saturday    Drug use: No   Sexual activity: Yes  Other Topics Concern   Not on file  Social History Narrative   Not on file   Social Determinants of Health   Financial Resource Strain: Not on file  Food Insecurity: Not on file  Transportation Needs: Not on file  Physical Activity: Not on file  Stress: Not on file  Social Connections: Not on file     Family History:  The patient's  family history includes CVA in an other family member; Cancer (age of onset: 41) in his father; Heart attack (age of onset: 31) in his paternal grandfather; Hypertension in his sister.   ROS:   Please see the history of present illness.    ROS All other systems reviewed and are negative.   PHYSICAL EXAM:   VS:  BP 138/90   Pulse 75   Ht 6' (1.829 m)   Wt 216 lb (98 kg)   SpO2 97%   BMI 29.29 kg/m   Physical Exam  GEN: Well nourished, well developed, in no acute distress  Neck: no JVD, carotid bruits, or masses Cardiac:RRR; no murmurs, rubs, or gallops  Respiratory:  clear to auscultation bilaterally, normal work of breathing GI: soft, nontender, nondistended, + BS Ext: without cyanosis, clubbing, or edema, Good distal pulses bilaterally Neuro:  Alert and Oriented x 3,  Psych: euthymic mood, full affect  Wt Readings from Last 3 Encounters:  05/04/21 216 lb (98 kg)  06/30/20 216 lb (98 kg)  06/15/20 215 lb (97.5 kg)      Studies/Labs Reviewed:   EKG:  EKG is not ordered today.  The ekg reviewed from [facility] 05/02/2021 normal sinus rhythm with first-degree AV  block, inferior infarct and LVH unchanged from prior tracings Recent Labs: 06/30/2020: Hemoglobin 13.3; Platelets 141 07/28/2020: ALT 20; BUN 12; Creatinine, Ser 1.22; Potassium 4.5; Sodium 138   Lipid Panel    Component Value Date/Time   CHOL 125 07/28/2020 0833   TRIG 80 07/28/2020 0833   HDL 40 07/28/2020 0833   CHOLHDL 3.1 07/28/2020 0833   CHOLHDL 10.9 04/08/2015 0545   VLDL 25 04/08/2015 0545   LDLCALC 69 07/28/2020 0833    Additional studies/ records that were reviewed today include:  CORONARY ANGIOGRAPHY 05/2020  CORONARY STENT INTERVENTION      PCI Conclusion 1/22  Successful stenting of the mid to distal RCA that included the acute bend in the mid RCA using a 34 x 3.5 mm Onyx postdilated to 4.0 mm in diameter.  85% stenosis was reduced to 0% with TIMI grade III flow. A guide catheter extension was needed to be able to deliver the stent due to a "shepherd's crook "margin. TIMI grade III flow was noted.   RECOMMENDATIONS:   Aspirin and Plavix for 1 year.  Then discontinue aspirin and continue Plavix as before. Overnight stay due to 7 French sheath which would increase the risk of post procedure bleeding.       LEFT HEART CATH AND CORONARY ANGIOGRAPHY 04/2020      Conclusion   Total occlusion of the mid circumflex stent Widely patent mid LAD stent.  Moderate diffuse disease throughout the LAD. Widely patent left main. Dominant right coronary with shepherd's crook, and significant angulation in the mid to distal segment followed by an 85% focal stenosis.  Unable to achieve coaxial support in the right coronary with an extra-support guide catheter.  Because of CKD, we decided to perform PCI as a stand-alone procedure from the right femoral approach at some point in the next 2 weeks. The EF is 50% and EDP is 13 mmHg.   RECOMMENDATIONS:   Plan high risk PCI via right femoral approach using extra-support guide catheter such as Amplatz 1 cm or 2 cm catheter and may need a  guide liner to give best opportunity to that deliver the distal stent. Continue same medication.   Return if angina at rest.  Stay below anginal threshold. My office will arrange the repeat procedure.     Myoview Study Highlights 04/2020     Nuclear stress EF: 53%. There was no ST segment deviation noted during stress. Findings consistent with prior myocardial infarction with peri-infarct ischemia. This is an intermediate risk study. The left ventricular ejection fraction is mildly decreased (45-54%).   There is a medium size partially reversible defect in the inferior and inferolateral walls from base to apex.    At the apex, the inferior wall defect appears moderate and fixed. At the mid ventricle, the inferior wall defect is moderate and fixed and there is a moderate stress induced perfusion defect in the inferolateral wall that has complete reversibility at rest. At the base, the inferior and inferolateral segments have moderate perfusion defect with partial reversibility to mild at rest. Findings consistent with prior infarct with peri-infarct ischemia.    No ischemia noted on stress ECG.    Intermediate risk study.      Echo Study Conclusions 2016  - Procedure narrative: Transthoracic echocardiography. Image    quality was poor. The study was technically difficult, as a    result of poor acoustic windows, poor sound wave transmission,    restricted patient mobility, and body habitus.  - Left ventricle: The cavity size was normal. Wall thickness was    increased in a pattern of moderate LVH. Systolic function was    mildly reduced. The estimated ejection fraction was in the range    of 45% to 50%. Regional wall motion abnormalities cannot be    excluded. Doppler parameters are consistent with abnormal left    ventricular relaxation (grade 1 diastolic dysfunction).  - Aortic valve: There was mild regurgitation.  - Mitral valve: There was mild regurgitation.  - Left atrium:  The atrium was moderately dilated.          Risk Assessment/Calculations:  ASSESSMENT:    1. Preoperative clearance   2. Coronary artery disease involving native coronary artery of native heart with unstable angina pectoris ([place])   3. Hypertension, essential   4. Hyperlipidemia LDL goal <70      PLAN:  In order of problems listed above:  Preoperative clearance before undergoing shoulder replacement surgery by Dr. Joya Salm Patient with recent stent to the mid distal RCA 05/2020 on Plavix and aspirin for a minimum of 1 year.  Patient would like to have surgery before the end of the year.   Dr. Tamala Julian says patient can hold Plavix 5-7 days before surgery and then resume Plavix only and stop ASA after surgery.  Otherwise he has no cardiac complaints and has been very active up until the last 3 months because of shoulder pain.  He can proceed with surgery without any cardiac work-up. According to the Revised Cardiac Risk Index (RCRI), his Perioperative Risk of Major Cardiac Event is (%): 0.9  His Functional Capacity in METs is: 8.23 according to the Duke Activity Status Index (DASI).   CAD with DES to the RCA circumflex and LAD in 2016, abnormal Myoview 05/2020 followed by stent to the mid distal RCA  Hypertension BP running high losartan stopped because Crt 1.7.  We will start chlorthalidone 25 mg half a tablet once daily.  Patient is to call if his blood pressure consistently runs greater than 135/85   Hyperlipidemia LDL 69 07/28/2020  Shared Decision Making/Informed Consent        Medication Adjustments/Labs and Tests Ordered: Current medicines are reviewed at length with the patient today.  Concerns regarding medicines are outlined above.  Medication changes, Labs and Tests ordered today are listed in the Patient Instructions below. Patient Instructions  Medication Instructions:  1.Start chlorthalidone 12.5 mg daily, this will be one-half of a 25 mg tablet *If you  need a refill on your cardiac medications before your next appointment, please call your pharmacy*   Lab Work: None If you have labs (blood work) drawn today and your tests are completely normal, you will receive your results only by: [place] (if you have MyChart) OR A paper copy in the mail If you have any lab test that is abnormal or we need to change your treatment, we will call you to review the results.   Follow-Up: At [facility], you and your health needs are our priority.  As part of our continuing mission to provide you with exceptional heart care, we have created designated Provider Care Teams.  These Care Teams include your primary Cardiologist (physician) and Advanced Practice Providers (APPs -  Physician Assistants and Nurse Practitioners) who all work together to provide you with the care you need, when you need it.   Your next appointment:   6 month(s), 10/31/21 at 10:00 AM  The format for your next appointment:   In Person  Provider:   Sinclair Grooms, MD     Other Instructions Check your blood pressure daily and send the readings to [place] through your MyChart    Signed, Ermalinda Barrios, PA-C  05/04/2021 10:49 AM    [address], [place], [place]  [address] Phone: [phone]; Fax: [phone]

## 2021-04-27 DIAGNOSIS — M1711 Unilateral primary osteoarthritis, right knee: Secondary | ICD-10-CM | POA: Diagnosis not present

## 2021-05-02 DIAGNOSIS — M79609 Pain in unspecified limb: Secondary | ICD-10-CM | POA: Diagnosis not present

## 2021-05-02 DIAGNOSIS — Z01818 Encounter for other preprocedural examination: Secondary | ICD-10-CM | POA: Diagnosis not present

## 2021-05-02 DIAGNOSIS — E559 Vitamin D deficiency, unspecified: Secondary | ICD-10-CM | POA: Diagnosis not present

## 2021-05-02 DIAGNOSIS — Z79899 Other long term (current) drug therapy: Secondary | ICD-10-CM | POA: Diagnosis not present

## 2021-05-02 DIAGNOSIS — R52 Pain, unspecified: Secondary | ICD-10-CM | POA: Diagnosis not present

## 2021-05-04 ENCOUNTER — Encounter: Payer: Self-pay | Admitting: Physician Assistant

## 2021-05-04 ENCOUNTER — Other Ambulatory Visit: Payer: Self-pay

## 2021-05-04 ENCOUNTER — Ambulatory Visit: Payer: BC Managed Care – PPO | Admitting: Physician Assistant

## 2021-05-04 VITALS — BP 138/90 | HR 75 | Ht 72.0 in | Wt 216.0 lb

## 2021-05-04 DIAGNOSIS — I25110 Atherosclerotic heart disease of native coronary artery with unstable angina pectoris: Secondary | ICD-10-CM | POA: Diagnosis not present

## 2021-05-04 DIAGNOSIS — Z01818 Encounter for other preprocedural examination: Secondary | ICD-10-CM | POA: Diagnosis not present

## 2021-05-04 DIAGNOSIS — E785 Hyperlipidemia, unspecified: Secondary | ICD-10-CM

## 2021-05-04 DIAGNOSIS — I10 Essential (primary) hypertension: Secondary | ICD-10-CM | POA: Diagnosis not present

## 2021-05-04 MED ORDER — CHLORTHALIDONE 25 MG PO TABS: 12.5000 mg | ORAL_TABLET | Freq: Every day | ORAL | 3 refills | Status: DC

## 2021-05-04 NOTE — Patient Instructions (Addendum)
Medication Instructions:  1.Start chlorthalidone 12.5 mg daily, this will be one-half of a 25 mg tablet *If you need a refill on your cardiac medications before your next appointment, please call your pharmacy*   Lab Work: None If you have labs (blood work) drawn today and your tests are completely normal, you will receive your results only by: [place] (if you have MyChart) OR A paper copy in the mail If you have any lab test that is abnormal or we need to change your treatment, we will call you to review the results.   Follow-Up: At [facility], you and your health needs are our priority.  As part of our continuing mission to provide you with exceptional heart care, we have created designated Provider Care Teams.  These Care Teams include your primary Cardiologist (physician) and Advanced Practice Providers (APPs -  Physician Assistants and Nurse Practitioners) who all work together to provide you with the care you need, when you need it.   Your next appointment:   6 month(s), 10/31/21 at 10:00 AM  The format for your next appointment:   In Person  Provider:   Sinclair Grooms, MD     Other Instructions Check your blood pressure daily and send the readings to [place] through your MyChart

## 2021-05-10 ENCOUNTER — Other Ambulatory Visit: Payer: Self-pay | Admitting: Interventional Cardiology

## 2021-05-10 MED ORDER — ROSUVASTATIN CALCIUM 20 MG PO TABS: 20.0000 mg | ORAL_TABLET | Freq: Every day | ORAL | 3 refills | Status: DC

## 2021-05-11 ENCOUNTER — Other Ambulatory Visit: Payer: Self-pay

## 2021-05-11 MED ORDER — METOPROLOL SUCCINATE ER 200 MG PO TB24: 200.0000 mg | ORAL_TABLET | Freq: Every day | ORAL | 3 refills | Status: DC

## 2021-06-09 DIAGNOSIS — E785 Hyperlipidemia, unspecified: Secondary | ICD-10-CM | POA: Diagnosis not present

## 2021-06-09 DIAGNOSIS — R600 Localized edema: Secondary | ICD-10-CM | POA: Diagnosis not present

## 2021-06-09 DIAGNOSIS — G8918 Other acute postprocedural pain: Secondary | ICD-10-CM | POA: Diagnosis not present

## 2021-06-09 DIAGNOSIS — Z9889 Other specified postprocedural states: Secondary | ICD-10-CM | POA: Diagnosis not present

## 2021-06-09 DIAGNOSIS — I255 Ischemic cardiomyopathy: Secondary | ICD-10-CM | POA: Diagnosis not present

## 2021-06-09 DIAGNOSIS — Z471 Aftercare following joint replacement surgery: Secondary | ICD-10-CM | POA: Diagnosis not present

## 2021-06-09 DIAGNOSIS — I10 Essential (primary) hypertension: Secondary | ICD-10-CM | POA: Diagnosis not present

## 2021-06-09 DIAGNOSIS — Z96642 Presence of left artificial hip joint: Secondary | ICD-10-CM | POA: Diagnosis not present

## 2021-06-09 DIAGNOSIS — Z7982 Long term (current) use of aspirin: Secondary | ICD-10-CM | POA: Diagnosis not present

## 2021-06-09 DIAGNOSIS — Z79899 Other long term (current) drug therapy: Secondary | ICD-10-CM | POA: Diagnosis not present

## 2021-06-09 DIAGNOSIS — M19012 Primary osteoarthritis, left shoulder: Secondary | ICD-10-CM | POA: Diagnosis not present

## 2021-06-09 DIAGNOSIS — I2510 Atherosclerotic heart disease of native coronary artery without angina pectoris: Secondary | ICD-10-CM | POA: Diagnosis not present

## 2021-06-10 DIAGNOSIS — I255 Ischemic cardiomyopathy: Secondary | ICD-10-CM | POA: Diagnosis not present

## 2021-06-10 DIAGNOSIS — Z79899 Other long term (current) drug therapy: Secondary | ICD-10-CM | POA: Diagnosis not present

## 2021-06-10 DIAGNOSIS — I2510 Atherosclerotic heart disease of native coronary artery without angina pectoris: Secondary | ICD-10-CM | POA: Diagnosis not present

## 2021-06-10 DIAGNOSIS — I10 Essential (primary) hypertension: Secondary | ICD-10-CM | POA: Diagnosis not present

## 2021-06-10 DIAGNOSIS — Z7982 Long term (current) use of aspirin: Secondary | ICD-10-CM | POA: Diagnosis not present

## 2021-06-10 DIAGNOSIS — E785 Hyperlipidemia, unspecified: Secondary | ICD-10-CM | POA: Diagnosis not present

## 2021-06-10 DIAGNOSIS — M19012 Primary osteoarthritis, left shoulder: Secondary | ICD-10-CM | POA: Diagnosis not present

## 2021-06-16 DIAGNOSIS — M25512 Pain in left shoulder: Secondary | ICD-10-CM | POA: Diagnosis not present

## 2021-06-16 DIAGNOSIS — M19012 Primary osteoarthritis, left shoulder: Secondary | ICD-10-CM | POA: Diagnosis not present

## 2021-06-16 DIAGNOSIS — M6281 Muscle weakness (generalized): Secondary | ICD-10-CM | POA: Diagnosis not present

## 2021-06-20 DIAGNOSIS — M25512 Pain in left shoulder: Secondary | ICD-10-CM | POA: Diagnosis not present

## 2021-06-20 DIAGNOSIS — M6281 Muscle weakness (generalized): Secondary | ICD-10-CM | POA: Diagnosis not present

## 2021-06-20 DIAGNOSIS — M19012 Primary osteoarthritis, left shoulder: Secondary | ICD-10-CM | POA: Diagnosis not present

## 2021-06-27 DIAGNOSIS — M25512 Pain in left shoulder: Secondary | ICD-10-CM | POA: Diagnosis not present

## 2021-06-27 DIAGNOSIS — M19012 Primary osteoarthritis, left shoulder: Secondary | ICD-10-CM | POA: Diagnosis not present

## 2021-06-27 DIAGNOSIS — M6281 Muscle weakness (generalized): Secondary | ICD-10-CM | POA: Diagnosis not present

## 2021-07-04 DIAGNOSIS — M19012 Primary osteoarthritis, left shoulder: Secondary | ICD-10-CM | POA: Diagnosis not present

## 2021-07-04 DIAGNOSIS — M6281 Muscle weakness (generalized): Secondary | ICD-10-CM | POA: Diagnosis not present

## 2021-07-04 DIAGNOSIS — M25512 Pain in left shoulder: Secondary | ICD-10-CM | POA: Diagnosis not present

## 2021-07-11 DIAGNOSIS — M6281 Muscle weakness (generalized): Secondary | ICD-10-CM | POA: Diagnosis not present

## 2021-07-11 DIAGNOSIS — M25512 Pain in left shoulder: Secondary | ICD-10-CM | POA: Diagnosis not present

## 2021-07-11 DIAGNOSIS — M19012 Primary osteoarthritis, left shoulder: Secondary | ICD-10-CM | POA: Diagnosis not present

## 2021-07-20 DIAGNOSIS — M19012 Primary osteoarthritis, left shoulder: Secondary | ICD-10-CM | POA: Diagnosis not present

## 2021-07-20 DIAGNOSIS — M6281 Muscle weakness (generalized): Secondary | ICD-10-CM | POA: Diagnosis not present

## 2021-07-20 DIAGNOSIS — M25512 Pain in left shoulder: Secondary | ICD-10-CM | POA: Diagnosis not present

## 2021-07-27 DIAGNOSIS — M25512 Pain in left shoulder: Secondary | ICD-10-CM | POA: Diagnosis not present

## 2021-07-27 DIAGNOSIS — M19012 Primary osteoarthritis, left shoulder: Secondary | ICD-10-CM | POA: Diagnosis not present

## 2021-07-27 DIAGNOSIS — M6281 Muscle weakness (generalized): Secondary | ICD-10-CM | POA: Diagnosis not present

## 2021-09-05 DIAGNOSIS — M1711 Unilateral primary osteoarthritis, right knee: Secondary | ICD-10-CM | POA: Diagnosis not present

## 2021-09-08 DIAGNOSIS — F5101 Primary insomnia: Secondary | ICD-10-CM | POA: Diagnosis not present

## 2021-09-08 DIAGNOSIS — Z0000 Encounter for general adult medical examination without abnormal findings: Secondary | ICD-10-CM | POA: Diagnosis not present

## 2021-09-08 DIAGNOSIS — Z7982 Long term (current) use of aspirin: Secondary | ICD-10-CM | POA: Diagnosis not present

## 2021-09-08 DIAGNOSIS — Z13220 Encounter for screening for lipoid disorders: Secondary | ICD-10-CM | POA: Diagnosis not present

## 2021-09-08 DIAGNOSIS — Z125 Encounter for screening for malignant neoplasm of prostate: Secondary | ICD-10-CM | POA: Diagnosis not present

## 2021-09-08 DIAGNOSIS — I252 Old myocardial infarction: Secondary | ICD-10-CM | POA: Diagnosis not present

## 2021-09-08 DIAGNOSIS — Z7902 Long term (current) use of antithrombotics/antiplatelets: Secondary | ICD-10-CM | POA: Diagnosis not present

## 2021-09-24 IMAGING — CR DG CHEST 1V PORT
1 series · 1 of 1 positions shown · non-contrast
Comparison: Radiograph 04/07/2015

CLINICAL DATA: Cough, weakness

EXAM:
PORTABLE CHEST 1 VIEW

[AP]
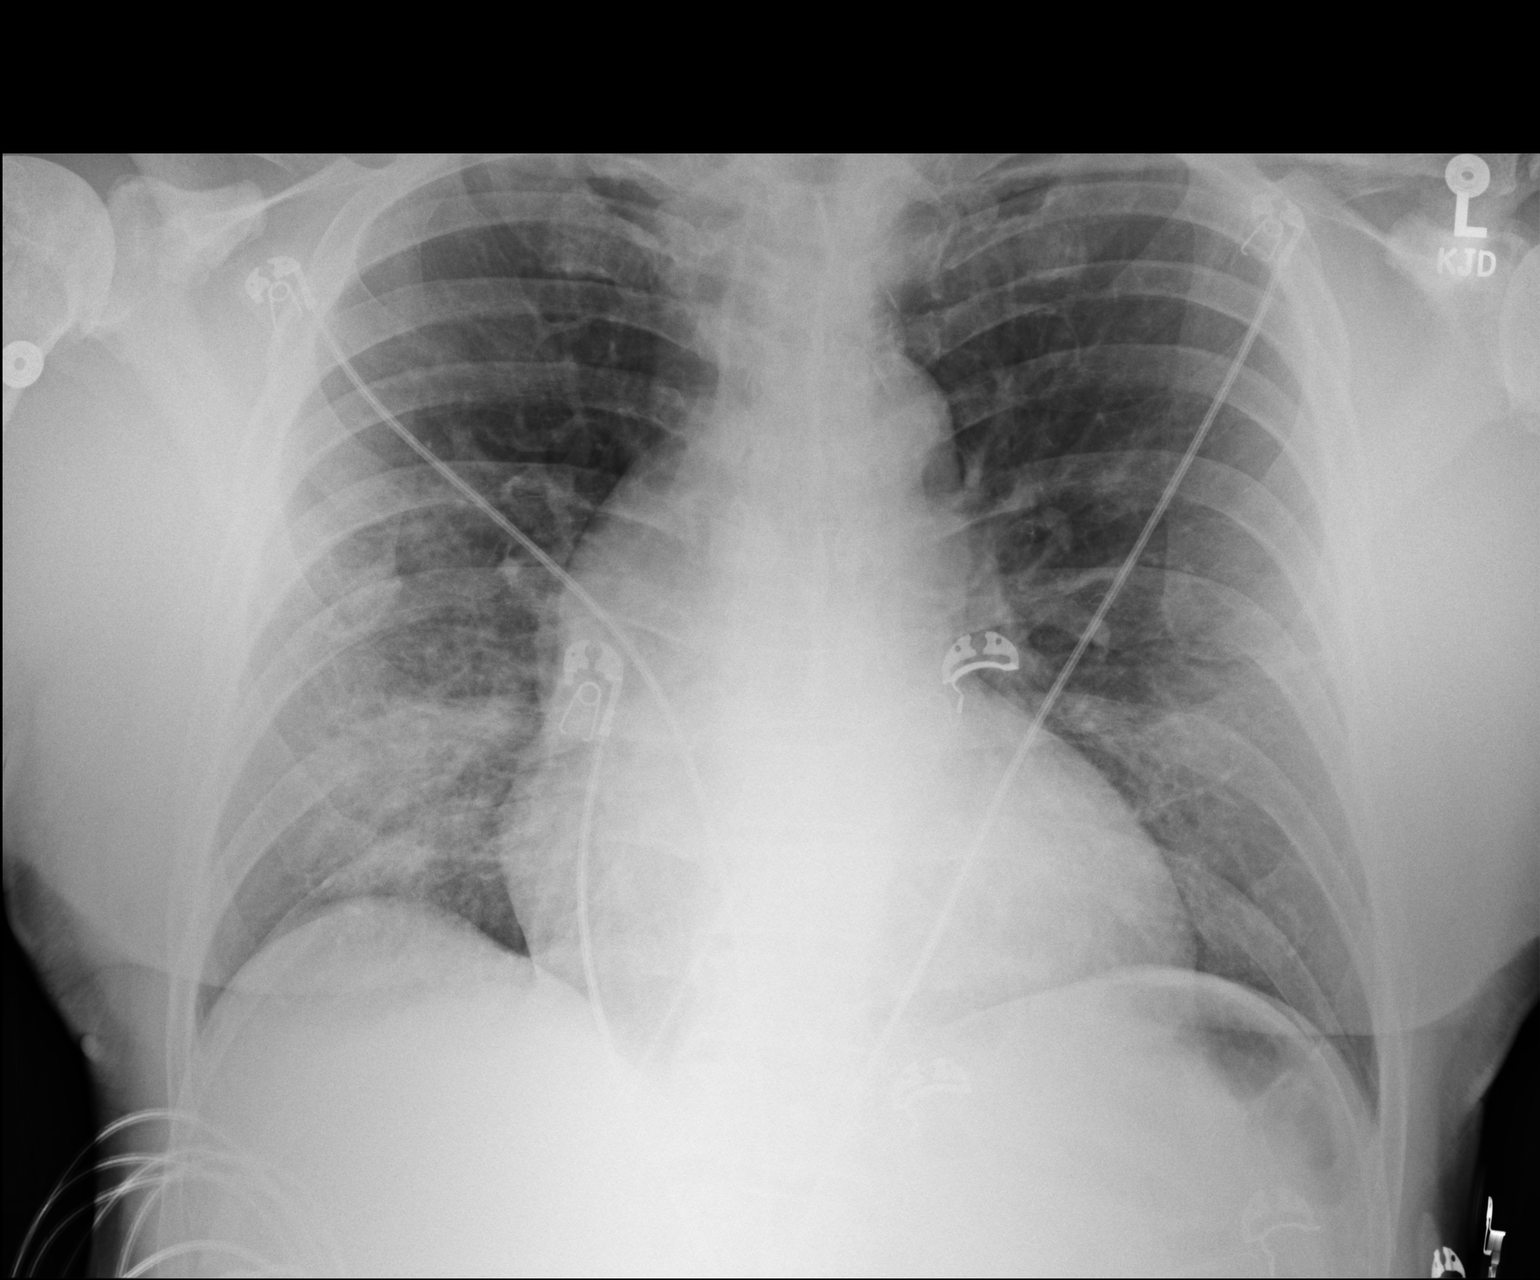

[1 of 1 positions shown; findings below may reference images not displayed]

FINDINGS: There is patchy interstitial and airspace opacity in both lungs but
most pronounced in the right infrahilar lung. No pneumothorax or
effusion. Mild cardiomegaly, similar to comparison. Degenerative
changes are present in the imaged spine and shoulders. No acute
osseous or soft tissue abnormality.
IMPRESSION: Patchy interstitial and airspace opacity in both lungs, most
pronounced in the right infrahilar lung, suspicious for pneumonia.

Recommend follow-up chest x-ray in 4-6 weeks after treatment.

## 2021-10-14 DIAGNOSIS — L821 Other seborrheic keratosis: Secondary | ICD-10-CM | POA: Diagnosis not present

## 2021-10-14 DIAGNOSIS — L738 Other specified follicular disorders: Secondary | ICD-10-CM | POA: Diagnosis not present

## 2021-10-14 DIAGNOSIS — D489 Neoplasm of uncertain behavior, unspecified: Secondary | ICD-10-CM | POA: Diagnosis not present

## 2021-10-14 DIAGNOSIS — L578 Other skin changes due to chronic exposure to nonionizing radiation: Secondary | ICD-10-CM | POA: Diagnosis not present

## 2021-10-14 DIAGNOSIS — C44612 Basal cell carcinoma of skin of right upper limb, including shoulder: Secondary | ICD-10-CM | POA: Diagnosis not present

## 2021-10-14 DIAGNOSIS — C44329 Squamous cell carcinoma of skin of other parts of face: Secondary | ICD-10-CM | POA: Diagnosis not present

## 2021-10-14 DIAGNOSIS — L858 Other specified epidermal thickening: Secondary | ICD-10-CM | POA: Diagnosis not present

## 2021-10-14 DIAGNOSIS — C4441 Basal cell carcinoma of skin of scalp and neck: Secondary | ICD-10-CM | POA: Diagnosis not present

## 2021-10-30 NOTE — Progress Notes (Deleted)
Cardiology Office Note:    Date:  10/30/2021   ID:  David Chang, DOB 1961-02-24, MRN [id number]  PCP:  Gweneth Fritter, FNP  Cardiologist:  Sinclair Grooms, MD   Referring MD: Gweneth Fritter, FNP   No chief complaint on file.   History of Present Illness:    David Chang is a 61 y.o. male with a hx of CAD with multivessel DES including RCA, circumflex, and LAD all in 2016. Hyperlipidemia not currently treated with statin therapy due to musculoskeletal syndrome, hypertension, and ischemic left ventricular dysfunction (EF 45-50%). Progression of de novo RCA CAD and Covid-19 infection.   ***  Past Medical History:  Diagnosis Date   Arthritis    Coronary artery disease    Hyperlipidemia LDL goal <70 04/10/2015   Hypertension    Ischemic cardiomyopathy 04/10/2015   S/P angioplasty with stent to mLAD DES, pRCA DES, mLCX DES 04/08/15  04/10/2015    Past Surgical History:  Procedure Laterality Date   ACHILLES TENDON SURGERY Left 12/06/2017   Procedure: Complete Left Achilles tendon repair; dermaspan graf, application of cast;  Surgeon: Latanya Maudlin, MD;  Location: [facility];  Service: Orthopedics;  Laterality: Left;   achillies repair     and medial gastrocnemius tear repair   CARDIAC CATHETERIZATION N/A 04/08/2015   Procedure: Left Heart Cath and Coronary Angiography;  Surgeon: Leonie Man, MD;  Location: [facility];  Service: Cardiovascular;  Laterality: N/A;   CORONARY ANGIOGRAPHY N/A 06/15/2020   Procedure: CORONARY ANGIOGRAPHY;  Surgeon: Belva Crome, MD;  Location: [facility];  Service: Cardiovascular;  Laterality: N/A;   CORONARY STENT INTERVENTION N/A 06/15/2020   Procedure: CORONARY STENT INTERVENTION;  Surgeon: Belva Crome, MD;  Location: [facility];  Service: Cardiovascular;  Laterality: N/A;   KNEE SURGERY Left    LEFT HEART CATH AND CORONARY ANGIOGRAPHY N/A 05/07/2020   Procedure: LEFT HEART CATH AND CORONARY ANGIOGRAPHY;   Surgeon: Belva Crome, MD;  Location: [facility];  Service: Cardiovascular;  Laterality: N/A;   left tricep surgery      Right tricep repair also   TOTAL KNEE ARTHROPLASTY Left 04/24/2016   Procedure: LEFT TOTAL KNEE ARTHROPLASTY;  Surgeon: Gaynelle Arabian, MD;  Location: [facility];  Service: Orthopedics;  Laterality: Left;    Current Medications: No outpatient medications have been marked as taking for the 10/31/21 encounter (Appointment) with Belva Crome, MD.     Allergies:   Tramadol   Social History   Socioeconomic History   Marital status: Married    Spouse name: Not on file   Number of children: Not on file   Years of education: Not on file   Highest education level: Not on file  Occupational History   Not on file  Tobacco Use   Smoking status: Never   Smokeless tobacco: Never  Vaping Use   Vaping Use: Never used  Substance and Sexual Activity   Alcohol use: Yes    Alcohol/week: 0.0 standard drinks of alcohol    Comment: casual drinking on Saturday    Drug use: No   Sexual activity: Yes  Other Topics Concern   Not on file  Social History Narrative   Not on file   Social Determinants of Health   Financial Resource Strain: Not on file  Food Insecurity: Not on file  Transportation Needs: Not on file  Physical Activity: Not on file  Stress: Not on file  Social Connections: Not on file     Family History: The patient's family history includes CVA in an other family member; Cancer (age of onset: 37) in his father; Heart attack (age of onset: 71) in his paternal grandfather; Hypertension in his sister.  ROS:   Please see the history of present illness.    *** All other systems reviewed and are negative.  EKGs/Labs/Other Studies Reviewed:    The following studies were reviewed today: ***  EKG:  EKG ***  Recent Labs: No results found for requested labs within last 365 days.  Recent Lipid Panel    Component Value Date/Time   CHOL 125 07/28/2020  0833   TRIG 80 07/28/2020 0833   HDL 40 07/28/2020 0833   CHOLHDL 3.1 07/28/2020 0833   CHOLHDL 10.9 04/08/2015 0545   VLDL 25 04/08/2015 0545   LDLCALC 69 07/28/2020 0833    Physical Exam:    VS:  There were no vitals taken for this visit.    Wt Readings from Last 3 Encounters:  05/04/21 216 lb (98 kg)  06/30/20 216 lb (98 kg)  06/15/20 215 lb (97.5 kg)     GEN: ***. No acute distress HEENT: Normal NECK: No JVD. LYMPHATICS: No lymphadenopathy CARDIAC: *** murmur. RRR *** gallop, or edema. VASCULAR: *** Normal Pulses. No bruits. RESPIRATORY:  Clear to auscultation without rales, wheezing or rhonchi  ABDOMEN: Soft, non-tender, non-distended, No pulsatile mass, MUSCULOSKELETAL: No deformity  SKIN: Warm and dry NEUROLOGIC:  Alert and oriented x 3 PSYCHIATRIC:  Normal affect   ASSESSMENT:    1. Coronary artery disease involving native coronary artery of native heart with unstable angina pectoris ([place])   2. Hypertension, essential   3. Hyperlipidemia LDL goal <70   4. Chronic combined systolic and diastolic heart failure, NYHA class 2 ([place])   5. Malignant hypertensive heart and CKD (chronic kidney disease) stage IV (HCC)    PLAN:    In order of problems listed above:  ***   Medication Adjustments/Labs and Tests Ordered: Current medicines are reviewed at length with the patient today.  Concerns regarding medicines are outlined above.  No orders of the defined types were placed in this encounter.  No orders of the defined types were placed in this encounter.   There are no Patient Instructions on file for this visit.   Signed, Sinclair Grooms, MD  10/30/2021 4:28 PM    [place]

## 2021-10-31 ENCOUNTER — Ambulatory Visit: Payer: BC Managed Care – PPO | Admitting: Interventional Cardiology

## 2021-10-31 DIAGNOSIS — I25110 Atherosclerotic heart disease of native coronary artery with unstable angina pectoris: Secondary | ICD-10-CM

## 2021-10-31 DIAGNOSIS — I5042 Chronic combined systolic (congestive) and diastolic (congestive) heart failure: Secondary | ICD-10-CM

## 2021-10-31 DIAGNOSIS — E785 Hyperlipidemia, unspecified: Secondary | ICD-10-CM

## 2021-10-31 DIAGNOSIS — I10 Essential (primary) hypertension: Secondary | ICD-10-CM

## 2021-10-31 DIAGNOSIS — I1310 Hypertensive heart and chronic kidney disease without heart failure, with stage 1 through stage 4 chronic kidney disease, or unspecified chronic kidney disease: Secondary | ICD-10-CM

## 2021-11-30 DIAGNOSIS — M19012 Primary osteoarthritis, left shoulder: Secondary | ICD-10-CM | POA: Diagnosis not present

## 2021-12-13 DIAGNOSIS — S86111A Strain of other muscle(s) and tendon(s) of posterior muscle group at lower leg level, right leg, initial encounter: Secondary | ICD-10-CM | POA: Diagnosis not present

## 2022-01-09 ENCOUNTER — Other Ambulatory Visit: Payer: Self-pay | Admitting: Physician Assistant

## 2022-01-11 DIAGNOSIS — S86111A Strain of other muscle(s) and tendon(s) of posterior muscle group at lower leg level, right leg, initial encounter: Secondary | ICD-10-CM | POA: Diagnosis not present

## 2022-01-25 ENCOUNTER — Ambulatory Visit: Payer: BC Managed Care – PPO | Attending: Interventional Cardiology | Admitting: Interventional Cardiology

## 2022-01-25 ENCOUNTER — Encounter: Payer: Self-pay | Admitting: Interventional Cardiology

## 2022-01-25 VITALS — BP 134/90 | HR 67 | Ht 72.0 in | Wt 227.6 lb

## 2022-01-25 DIAGNOSIS — I10 Essential (primary) hypertension: Secondary | ICD-10-CM | POA: Diagnosis not present

## 2022-01-25 DIAGNOSIS — E785 Hyperlipidemia, unspecified: Secondary | ICD-10-CM

## 2022-01-25 DIAGNOSIS — R011 Cardiac murmur, unspecified: Secondary | ICD-10-CM

## 2022-01-25 DIAGNOSIS — I5042 Chronic combined systolic (congestive) and diastolic (congestive) heart failure: Secondary | ICD-10-CM | POA: Diagnosis not present

## 2022-01-25 DIAGNOSIS — I77819 Aortic ectasia, unspecified site: Secondary | ICD-10-CM

## 2022-01-25 DIAGNOSIS — I25110 Atherosclerotic heart disease of native coronary artery with unstable angina pectoris: Secondary | ICD-10-CM

## 2022-01-25 DIAGNOSIS — R7989 Other specified abnormal findings of blood chemistry: Secondary | ICD-10-CM

## 2022-01-25 NOTE — Progress Notes (Addendum)
Cardiology Office Note:    Date:  02/07/2022   ID:  David Chang, DOB 1960-11-25, MRN [id number]  PCP:  David Fritter, FNP  Cardiologist:  David Grooms, MD   Referring MD: David Fritter, FNP   Chief Complaint  Patient presents with   Coronary Artery Disease   Thoracic Aortic Aneurysm   Hypertension    History of Present Illness:    David Chang is a 61 y.o. male with a hx of CAD with multivessel DES including RCA, LCX and LAD - all in 2016, abnormal myoview 05/2020 Stent mid-distal RCA,HLD, HTN,, aortic dilatation (44 mm 2016), LV dysfunction with EF of 45 to 50%.    He has no complaints.  He is happy with level of physical activity.  Denies shortness of breath and angina.  Exertional tolerance is excellent.  Past Medical History:  Diagnosis Date   Arthritis    Coronary artery disease    Hyperlipidemia LDL goal <70 04/10/2015   Hypertension    Ischemic cardiomyopathy 04/10/2015   S/P angioplasty with stent to mLAD DES, pRCA DES, mLCX DES 04/08/15  04/10/2015    Past Surgical History:  Procedure Laterality Date   ACHILLES TENDON SURGERY Left 12/06/2017   Procedure: Complete Left Achilles tendon repair; dermaspan graf, application of cast;  Surgeon: Latanya Maudlin, MD;  Location: [facility];  Service: Orthopedics;  Laterality: Left;   achillies repair     and medial gastrocnemius tear repair   CARDIAC CATHETERIZATION N/A 04/08/2015   Procedure: Left Heart Cath and Coronary Angiography;  Surgeon: Leonie Man, MD;  Location: [facility];  Service: Cardiovascular;  Laterality: N/A;   CORONARY ANGIOGRAPHY N/A 06/15/2020   Procedure: CORONARY ANGIOGRAPHY;  Surgeon: Belva Crome, MD;  Location: [facility];  Service: Cardiovascular;  Laterality: N/A;   CORONARY STENT INTERVENTION N/A 06/15/2020   Procedure: CORONARY STENT INTERVENTION;  Surgeon: Belva Crome, MD;  Location: [facility];  Service: Cardiovascular;  Laterality: N/A;   KNEE  SURGERY Left    LEFT HEART CATH AND CORONARY ANGIOGRAPHY N/A 05/07/2020   Procedure: LEFT HEART CATH AND CORONARY ANGIOGRAPHY;  Surgeon: Belva Crome, MD;  Location: [facility];  Service: Cardiovascular;  Laterality: N/A;   left tricep surgery      Right tricep repair also   TOTAL KNEE ARTHROPLASTY Left 04/24/2016   Procedure: LEFT TOTAL KNEE ARTHROPLASTY;  Surgeon: Gaynelle Arabian, MD;  Location: [facility];  Service: Orthopedics;  Laterality: Left;    Current Medications: Current Meds  Medication Sig   amLODipine (NORVASC) 10 MG tablet TAKE 1 TABLET BY MOUTH ONCE DAILY   chlorthalidone (HYGROTON) 25 MG tablet TAKE 1/2 TABLET(12.5 MG) BY MOUTH DAILY   clopidogrel (PLAVIX) 75 MG tablet TAKE ONE TABLET BY MOUTH DAILY   Glycerin-Hypromellose-PEG 400 (DRY EYE RELIEF DROPS) 0.2-0.2-1 % SOLN Place 1 drop into both eyes daily as needed (Dry eye).   metoprolol (TOPROL XL) 200 MG 24 hr tablet Take 1 tablet (200 mg total) by mouth daily.   milk thistle 175 MG tablet Take 175 mg by mouth daily.   nitroGLYCERIN (NITROSTAT) 0.4 MG SL tablet Place 1 tablet (0.4 mg total) under the tongue every 5 (five) minutes as needed for chest pain.   Omega-3 Fatty Acids (FISH OIL) 1000 MG CAPS Take 1,000 mg by mouth daily.   pantoprazole (PROTONIX) 20 MG tablet Take 20 mg by mouth daily as needed for heartburn or indigestion.  rosuvastatin (CRESTOR) 20 MG tablet Take 1 tablet (20 mg total) by mouth daily.   traZODone (DESYREL) 50 MG tablet Take 50-100 mg by mouth at bedtime as needed for sleep.   [DISCONTINUED] aspirin EC 81 MG tablet Take 81 mg by mouth daily.     Allergies:   Tramadol   Social History   Socioeconomic History   Marital status: Married    Spouse name: Not on file   Number of children: Not on file   Years of education: Not on file   Highest education level: Not on file  Occupational History   Not on file  Tobacco Use   Smoking status: Never   Smokeless tobacco: Never  Vaping Use    Vaping Use: Never used  Substance and Sexual Activity   Alcohol use: Yes    Alcohol/week: 0.0 standard drinks of alcohol    Comment: casual drinking on Saturday    Drug use: No   Sexual activity: Yes  Other Topics Concern   Not on file  Social History Narrative   Not on file   Social Determinants of Health   Financial Resource Strain: Not on file  Food Insecurity: Not on file  Transportation Needs: Not on file  Physical Activity: Not on file  Stress: Not on file  Social Connections: Not on file     Family History: The patient's family history includes CVA in an other family member; Cancer (age of onset: 46) in his father; Heart attack (age of onset: 61) in his paternal grandfather; Hypertension in his sister.  ROS:   Please see the history of present illness.    No medication side effects.  All other systems reviewed and are negative.  EKGs/Labs/Other Studies Reviewed:    The following studies were reviewed today: Laboratory data April 2023:: Creatinine 1.8 LDL 72 Total cholesterol 135 Hematocrit 51  CARDIAC CATH 06/15/2020 Diagnostic Dominance: Right  Intervention    EKG:  EKG normal sinus rhythm, prominent voltage, small inferior Q waves.  When compared to prior tracing from December 2022, heart rate is faster on the current EKG.  Otherwise no change including first-degree AV block with PR interval of around 220 ms.  Recent Labs: No results found for requested labs within last 365 days.  Recent Lipid Panel    Component Value Date/Time   CHOL 125 07/28/2020 0833   TRIG 80 07/28/2020 0833   HDL 40 07/28/2020 0833   CHOLHDL 3.1 07/28/2020 0833   CHOLHDL 10.9 04/08/2015 0545   VLDL 25 04/08/2015 0545   LDLCALC 69 07/28/2020 0833    Physical Exam:    VS:  BP (!) 134/90   Pulse 67   Ht 6' (1.829 m)   Wt 227 lb 9.6 oz (103.2 kg)   SpO2 94%   BMI 30.87 kg/m     Wt Readings from Last 3 Encounters:  01/25/22 227 lb 9.6 oz (103.2 kg)  05/04/21 216 lb  (98 kg)  06/30/20 216 lb (98 kg)     GEN: Healthy appearing. No acute distress HEENT: Normal NECK: No JVD. LYMPHATICS: No lymphadenopathy CARDIAC: 2/6 right upper sternal systolic aortic valve murmur.  Left lower parasternal and apical systolic mitral murmur. RRR S4 but no S3 gallop.  Bilateral ankle edema (due to amlodipine 10 mg/day). VASCULAR:  Normal Pulses. No bruits. RESPIRATORY:  Clear to auscultation without rales, wheezing or rhonchi  ABDOMEN: Soft, non-tender, non-distended, No pulsatile mass, MUSCULOSKELETAL: No deformity  SKIN: Warm and dry NEUROLOGIC:  Alert and  oriented x 3 PSYCHIATRIC:  Normal affect   ASSESSMENT:    1. Coronary artery disease involving native coronary artery of native heart with unstable angina pectoris ([place])   2. Chronic combined systolic and diastolic heart failure, NYHA class 2 ([place])   3. Hyperlipidemia LDL goal <70   4. Hypertension, essential   5. Elevated serum creatinine   6. Systolic murmur   7. Aortic dilatation (HCC)    PLAN:    In order of problems listed above:  Secondary prevention discussed.  He is getting ample physical activity and having no symptoms.  We will de-escalate antiplatelet therapy by discontinuing aspirin. 2D Doppler echocardiogram to reassess LV function Continue high intensity statin therapy with Crestor 20 mg/day.  Most recent LDL 6 months ago was 71. Blood pressure is well controlled.  Encourage salt restriction. CKD stage IIIb.  Most recent creatinine 1.8.  A thought in him is to decrease or discontinue chlorthalidone and add SGLT2.  If EF is not normal, we will consider making this change. 2D Doppler echocardiogram 2D Doppler echocardiogram   He requests a cardiology physician in [facility]  I recommended Dr. Agustin Cree. 1 year follow-up.   Medication Adjustments/Labs and Tests Ordered: Current medicines are reviewed at length with the patient today.  Concerns regarding medicines are outlined above.   Orders Placed This Encounter  Procedures   EKG 12-Lead   ECHOCARDIOGRAM COMPLETE   No orders of the defined types were placed in this encounter.   Patient Instructions  Medication Instructions:  Your physician has recommended you make the following change in your medication:   1) STOP Aspirin  *If you need a refill on your cardiac medications before your next appointment, please call your pharmacy*  Lab Work: NONE  Testing/Procedures: Your physician has requested that you have an echocardiogram. Echocardiography is a painless test that uses sound waves to create images of your heart. It provides your doctor with information about the size and shape of your heart and how well your heart's chambers and valves are working. This procedure takes approximately one hour. There are no restrictions for this procedure.  Follow-Up: At [facility], you and your health needs are our priority.  As part of our continuing mission to provide you with exceptional heart care, we have created designated Provider Care Teams.  These Care Teams include your primary Cardiologist (physician) and Advanced Practice Providers (APPs -  Physician Assistants and Nurse Practitioners) who all work together to provide you with the care you need, when you need it.  Your next appointment:   1 year(s)  The format for your next appointment:   In Person  Provider:   Park Liter, MD  Important Information About Sugar         Signed, David Grooms, MD  02/07/2022 12:46 PM    [place]

## 2022-01-25 NOTE — Patient Instructions (Addendum)
Medication Instructions:  Your physician has recommended you make the following change in your medication:   1) STOP Aspirin  *If you need a refill on your cardiac medications before your next appointment, please call your pharmacy*  Lab Work: NONE  Testing/Procedures: Your physician has requested that you have an echocardiogram. Echocardiography is a painless test that uses sound waves to create images of your heart. It provides your doctor with information about the size and shape of your heart and how well your heart's chambers and valves are working. This procedure takes approximately one hour. There are no restrictions for this procedure.  Follow-Up: At [facility], you and your health needs are our priority.  As part of our continuing mission to provide you with exceptional heart care, we have created designated Provider Care Teams.  These Care Teams include your primary Cardiologist (physician) and Advanced Practice Providers (APPs -  Physician Assistants and Nurse Practitioners) who all work together to provide you with the care you need, when you need it.  Your next appointment:   1 year(s)  The format for your next appointment:   In Person  Provider:   Park Liter, MD  Important Information About Sugar

## 2022-02-06 ENCOUNTER — Ambulatory Visit (HOSPITAL_COMMUNITY): Payer: BC Managed Care – PPO | Attending: Interventional Cardiology

## 2022-02-06 DIAGNOSIS — I5030 Unspecified diastolic (congestive) heart failure: Secondary | ICD-10-CM | POA: Diagnosis not present

## 2022-02-06 DIAGNOSIS — I517 Cardiomegaly: Secondary | ICD-10-CM

## 2022-02-06 DIAGNOSIS — R011 Cardiac murmur, unspecified: Secondary | ICD-10-CM | POA: Diagnosis not present

## 2022-02-06 DIAGNOSIS — I77810 Thoracic aortic ectasia: Secondary | ICD-10-CM | POA: Diagnosis not present

## 2022-02-06 HISTORY — PX: TRANSTHORACIC ECHOCARDIOGRAM: SHX275

## 2022-02-06 LAB — ECHOCARDIOGRAM COMPLETE
AR max vel: 2.13 cm2
AV Area VTI: 2.07 cm2
AV Area mean vel: 2.19 cm2
AV Mean grad: 12.5 mmHg
AV Peak grad: 23.6 mmHg
Ao pk vel: 2.43 m/s
Area-P 1/2: 2.39 cm2
S' Lateral: 3.2 cm

## 2022-02-07 ENCOUNTER — Encounter: Payer: Self-pay | Admitting: Interventional Cardiology

## 2022-02-08 ENCOUNTER — Telehealth: Payer: Self-pay | Admitting: Interventional Cardiology

## 2022-02-08 DIAGNOSIS — I77819 Aortic ectasia, unspecified site: Secondary | ICD-10-CM

## 2022-02-08 NOTE — Telephone Encounter (Signed)
Patient is returning RN's call for his Echo results. Please advise.

## 2022-02-08 NOTE — Telephone Encounter (Signed)
David Crome, MD  Wynetta Emery, KRISTIE L Let the patient know he has developed an aortic aneurysm measuring 4.6 cm involving the ascending aorta.  He needs chest CT to accurately assess the size of the aorta.  If CT scan confirms, he will need to have yearly follow-up.  CT scan should be done with contrast.  We would also need to add losartan if aortic dilatation to this degree is documented.    Left message for patient to call back. Will inform patient of the above results when he calls back.

## 2022-03-07 NOTE — Telephone Encounter (Signed)
Left message to call back for results

## 2022-03-10 DIAGNOSIS — I10 Essential (primary) hypertension: Secondary | ICD-10-CM | POA: Diagnosis not present

## 2022-03-10 DIAGNOSIS — I130 Hypertensive heart and chronic kidney disease with heart failure and stage 1 through stage 4 chronic kidney disease, or unspecified chronic kidney disease: Secondary | ICD-10-CM | POA: Diagnosis not present

## 2022-03-10 DIAGNOSIS — I5042 Chronic combined systolic (congestive) and diastolic (congestive) heart failure: Secondary | ICD-10-CM | POA: Diagnosis not present

## 2022-03-10 DIAGNOSIS — Z8679 Personal history of other diseases of the circulatory system: Secondary | ICD-10-CM | POA: Diagnosis not present

## 2022-03-10 DIAGNOSIS — E782 Mixed hyperlipidemia: Secondary | ICD-10-CM | POA: Diagnosis not present

## 2022-03-14 NOTE — Addendum Note (Signed)
Addended by: Molli Barrows on: 03/14/2022 09:12 AM   Modules accepted: Orders

## 2022-03-14 NOTE — Telephone Encounter (Signed)
Received callback from patient.  Discussed echo results.  Per Dr. Tamala Julian: Let the patient know he has developed an aortic aneurysm measuring 4.6 cm involving the ascending aorta.  He needs chest CT to accurately assess the size of the aorta.  If CT scan confirms, he will need to have yearly follow-up.  CT scan should be done with contrast.  We would also need to add losartan if aortic dilatation to this degree is documented.    Chest CT with contrast ordered. Scheduler will call patient to arrange date/time for CT scan.  Patient verbalized understanding and expressed appreciation for call.  Patient also requests Dr. Ellyn Hack for follow-up after Dr. Francee Piccolo, added to recall.

## 2022-03-14 NOTE — Telephone Encounter (Signed)
Patient returning call.

## 2022-03-20 DIAGNOSIS — M1711 Unilateral primary osteoarthritis, right knee: Secondary | ICD-10-CM | POA: Diagnosis not present

## 2022-03-22 DIAGNOSIS — I2510 Atherosclerotic heart disease of native coronary artery without angina pectoris: Secondary | ICD-10-CM | POA: Diagnosis present

## 2022-03-30 ENCOUNTER — Other Ambulatory Visit: Payer: Self-pay | Admitting: Interventional Cardiology

## 2022-03-30 ENCOUNTER — Ambulatory Visit (HOSPITAL_COMMUNITY)
Admission: RE | Admit: 2022-03-30 | Discharge: 2022-03-30 | Disposition: A | Discharge status: Home / Self Care | Payer: BC Managed Care – PPO | Source: Ambulatory Visit | Attending: Interventional Cardiology | Admitting: Interventional Cardiology

## 2022-03-30 DIAGNOSIS — I77819 Aortic ectasia, unspecified site: Secondary | ICD-10-CM | POA: Insufficient documentation

## 2022-03-30 DIAGNOSIS — I7121 Aneurysm of the ascending aorta, without rupture: Secondary | ICD-10-CM | POA: Diagnosis not present

## 2022-03-30 DIAGNOSIS — I712 Thoracic aortic aneurysm, without rupture, unspecified: Secondary | ICD-10-CM

## 2022-03-30 HISTORY — DX: Thoracic aortic aneurysm, without rupture, unspecified: I71.20

## 2022-03-30 MED ORDER — IOHEXOL 350 MG/ML SOLN
80.0000 mL | Freq: Once | INTRAVENOUS | Status: AC | PRN
  Administered 2022-03-30: 80 mL via INTRAVENOUS

## 2022-04-05 ENCOUNTER — Other Ambulatory Visit: Payer: Self-pay | Admitting: Interventional Cardiology

## 2022-04-05 DIAGNOSIS — I77819 Aortic ectasia, unspecified site: Secondary | ICD-10-CM

## 2022-04-05 NOTE — Progress Notes (Signed)
Per CT angio result note from Dr. Tamala Julian: Let the patient know the CT confirms moderate sized aortic aneurysm. Needs to be careful with heavy lifting and we have to control BP < 130/80 mmHG. Needs TCT surgery co-management. Recommend 6 month f/u CT or MRA as recommended in re[port prior to condult with TCTS. I think his Cardiologist going forward will be Dr. Agustin Cree.

## 2022-04-06 DIAGNOSIS — C44612 Basal cell carcinoma of skin of right upper limb, including shoulder: Secondary | ICD-10-CM | POA: Diagnosis not present

## 2022-04-06 DIAGNOSIS — C44329 Squamous cell carcinoma of skin of other parts of face: Secondary | ICD-10-CM | POA: Diagnosis not present

## 2022-04-17 ENCOUNTER — Other Ambulatory Visit: Payer: Self-pay | Admitting: Interventional Cardiology

## 2022-04-18 ENCOUNTER — Other Ambulatory Visit: Payer: Self-pay

## 2022-04-18 MED ORDER — ROSUVASTATIN CALCIUM 20 MG PO TABS: 20.0000 mg | ORAL_TABLET | Freq: Every day | ORAL | 2 refills | Status: DC

## 2022-04-24 ENCOUNTER — Encounter: Payer: Self-pay | Admitting: Interventional Cardiology

## 2022-04-24 MED ORDER — CHLORTHALIDONE 25 MG PO TABS: ORAL_TABLET | ORAL | 3 refills | Status: DC

## 2022-05-18 ENCOUNTER — Institutional Professional Consult (permissible substitution): Payer: BC Managed Care – PPO | Admitting: Physician Assistant

## 2022-05-18 ENCOUNTER — Encounter: Payer: Self-pay | Admitting: Physician Assistant

## 2022-05-18 VITALS — BP 158/100 | HR 83 | Resp 20 | Ht 72.0 in | Wt 227.0 lb

## 2022-05-18 DIAGNOSIS — I7121 Aneurysm of the ascending aorta, without rupture: Secondary | ICD-10-CM | POA: Diagnosis not present

## 2022-05-18 DIAGNOSIS — I10 Essential (primary) hypertension: Secondary | ICD-10-CM | POA: Diagnosis not present

## 2022-05-18 DIAGNOSIS — I2510 Atherosclerotic heart disease of native coronary artery without angina pectoris: Secondary | ICD-10-CM

## 2022-05-18 MED ORDER — LOSARTAN POTASSIUM 25 MG PO TABS: 25.0000 mg | ORAL_TABLET | Freq: Every day | ORAL | 5 refills | Status: DC

## 2022-05-18 NOTE — Patient Instructions (Signed)
Avoid repetitive lifting of more than 40 pounds.  Will add losartan 25 mg once daily to your blood pressure medication regimen.  Keep close watch on your blood pressure with a target of less than 130/80 consistently.  Continue Crestor for management of your dyslipidemia (high cholesterol)  Will plan follow-up in the office in 6 months with repeat chest CT angiogram.

## 2022-05-18 NOTE — Progress Notes (Signed)
[address]       [place] [address]             [phone]      PCP is Gweneth Fritter, FNP Referring Provider is Belva Crome, MD  Reason for consult: Evaluation and surveillance of thoracic aortic aneurysm   HPI: Mr. Lyal Husted is a 61 year old gentleman with past history of dyslipidemia, hypertension, coronary artery disease status post myocardial infarction and PTCI of the LAD, circumflex, and posterior descending coronary arteries in 2016.  He has combined systolic and diastolic heart failure he also has stage IIIa chronic kidney disease.  Has a history of basal cell carcinoma of the right shoulder and squamous cell carcinoma of the left cheek.  He was recently undergoing evaluation by Dr. Daneen Schick that included an echocardiogram (September of this year).  The echocardiogram demonstrated an ejection fraction of 55% with grade 1 diastolic dysfunction.  There was significant calcification of the aortic valve so that it was not possible to determine if it was bicuspid or tricuspid. Marland Kitchen  He had no significant aortic stenosis or aortic insufficiency.  The aortic root was dilated at 42 mm and the ascending aorta was noted to be dilated to 46 mm.  CTA of the chest was recommended by Dr. Tamala Julian and carried out on 03/30/2022 confirming a 4.6 cm thoracic aortic aneurysm with no other significant abnormalities.  Mr. Ishida has been referred to CT surgery for evaluation and surveillance. He has been taking amlodipine, chlorthalidone, and metoprolol for his blood pressure and historically had reasonably good control on this regimen.  He has no known family history of vascular disease, or aneurysms but is mention that his grandfather died suddenly at age 8 from what was thought to be a heart attack. Mr. Leonides Schanz reports he feels well and has not had any chest pain or shortness of breath.  He is self-employed in [employer] for [employer].  He describes his work has  been very stressful.  Works out on a regular basis both cardio and weight lifting.    Past Medical History:  Diagnosis Date   Arthritis    Coronary artery disease    Hyperlipidemia LDL goal <70 04/10/2015   Hypertension    Ischemic cardiomyopathy 04/10/2015   S/P angioplasty with stent to mLAD DES, pRCA DES, mLCX DES 04/08/15  04/10/2015    Past Surgical History:  Procedure Laterality Date   ACHILLES TENDON SURGERY Left 12/06/2017   Procedure: Complete Left Achilles tendon repair; dermaspan graf, application of cast;  Surgeon: Latanya Maudlin, MD;  Location: [facility];  Service: Orthopedics;  Laterality: Left;   achillies repair     and medial gastrocnemius tear repair   CARDIAC CATHETERIZATION N/A 04/08/2015   Procedure: Left Heart Cath and Coronary Angiography;  Surgeon: Leonie Man, MD;  Location: [facility];  Service: Cardiovascular;  Laterality: N/A;   CORONARY ANGIOGRAPHY N/A 06/15/2020   Procedure: CORONARY ANGIOGRAPHY;  Surgeon: Belva Crome, MD;  Location: [facility];  Service: Cardiovascular;  Laterality: N/A;   CORONARY STENT INTERVENTION N/A 06/15/2020   Procedure: CORONARY STENT INTERVENTION;  Surgeon: Belva Crome, MD;  Location: [facility];  Service: Cardiovascular;  Laterality: N/A;   KNEE SURGERY Left    LEFT HEART CATH AND CORONARY ANGIOGRAPHY N/A 05/07/2020   Procedure: LEFT HEART CATH AND CORONARY ANGIOGRAPHY;  Surgeon: Belva Crome, MD;  Location: [facility];  Service: Cardiovascular;  Laterality: N/A;   left tricep surgery      Right tricep repair also   TOTAL KNEE ARTHROPLASTY Left 04/24/2016   Procedure: LEFT TOTAL KNEE ARTHROPLASTY;  Surgeon: Gaynelle Arabian, MD;  Location: [facility];  Service: Orthopedics;  Laterality: Left;    Family History  Problem Relation Age of Onset   CVA Other    Cancer Father 73       LUNG   Heart attack Paternal Grandfather 83   Hypertension Sister     Social History Social History    Tobacco Use   Smoking status: Never   Smokeless tobacco: Never  Vaping Use   Vaping Use: Never used  Substance Use Topics   Alcohol use: Yes    Alcohol/week: 0.0 standard drinks of alcohol    Comment: casual drinking on Saturday    Drug use: No    Current Outpatient Medications  Medication Sig Dispense Refill   amLODipine (NORVASC) 10 MG tablet TAKE 1 TABLET BY MOUTH ONCE DAILY 90 tablet 2   chlorthalidone (HYGROTON) 25 MG tablet TAKE 1/2 TABLET(12.5 MG) BY MOUTH DAILY 45 tablet 3   clopidogrel (PLAVIX) 75 MG tablet TAKE ONE TABLET BY MOUTH DAILY 90 tablet 1   Glycerin-Hypromellose-PEG 400 (DRY EYE RELIEF DROPS) 0.2-0.2-1 % SOLN Place 1 drop into both eyes daily as needed (Dry eye).     metoprolol (TOPROL-XL) 200 MG 24 hr tablet TAKE 1 TABLET(200 MG) BY MOUTH DAILY 90 tablet 3   milk thistle 175 MG tablet Take 175 mg by mouth daily.     nitroGLYCERIN (NITROSTAT) 0.4 MG SL tablet Place 1 tablet (0.4 mg total) under the tongue every 5 (five) minutes as needed for chest pain. 25 tablet 4   Omega-3 Fatty Acids (FISH OIL) 1000 MG CAPS Take 1,000 mg by mouth daily.     pantoprazole (PROTONIX) 20 MG tablet Take 20 mg by mouth daily as needed for heartburn or indigestion.     rosuvastatin (CRESTOR) 20 MG tablet Take 1 tablet (20 mg total) by mouth daily. 90 tablet 2   traZODone (DESYREL) 50 MG tablet Take 50-100 mg by mouth at bedtime as needed for sleep.     No current facility-administered medications for this visit.    Allergies  Allergen Reactions   Tramadol Other (See Comments)    Gi upset    Review of Systems: Review of Systems  Constitutional: Negative.   HENT: Negative.    Eyes: Negative.   Respiratory: Negative.    Cardiovascular: Negative.   Gastrointestinal: Negative.   Genitourinary: Negative.   Musculoskeletal:        Arthritis  Skin: Negative.   Neurological: Negative.   Endo/Heme/Allergies: Negative.   Psychiatric/Behavioral: Negative.        Physical  Exam: Vital signs: BP 158/100 Pulse 83 Respirations 20 SpO2 96% on room air  Diagnostic Tests: ECHOCARDIOGRAM REPORT       Patient Name:   MURL GOLLADAY Cirigliano Date of Exam: 02/06/2022  Medical Rec #:  [id number]        Height:       72.0 in  Accession #:    [id number]       Weight:       227.6 lb  Date of Birth:  02/03/1961        BSA:          2.251 m  Patient Age:    56 years  BP:           134/90 mmHg  Patient Gender: M                HR:           74 bpm.  Exam Location:  [facility]   Procedure: 2D Echo, 3D Echo, Cardiac Doppler, Color Doppler and Strain  Analysis   Indications:   R01.1 Murmur    History:        Patient has prior history of Echocardiogram examinations,  most                 recent 04/08/2015.    Sonographer:    Basilia Jumbo BS, RDCS  Referring Phys: [place]     1. My measurement of septum 14 mm . Left ventricular ejection fraction,  by estimation, is 55%. The left ventricle has normal function. The left  ventricle has no regional wall motion abnormalities. Left ventricular  diastolic parameters are consistent  with Grade I diastolic dysfunction (impaired relaxation). The average left  ventricular global longitudinal strain is -23.3 %. The global longitudinal  strain is normal.   2. Right ventricular systolic function is normal. The right ventricular  size is normal.   3. Left atrial size was moderately dilated.   4. The mitral valve is normal in structure. Trivial mitral valve  regurgitation. No evidence of mitral stenosis.   5. Cannot tell how many leaflets some calcium but no AS by AVA mean  gradient 12 mmHg . The aortic valve was not well visualized. Aortic valve  regurgitation is not visualized. No aortic stenosis is present.   6. Aortic dilatation noted. There is moderate dilatation of the aortic  root, measuring 42 mm. There is severe dilatation of the ascending aorta,  measuring 46 mm.   7. The inferior vena  cava is normal in size with greater than 50%  respiratory variability, suggesting right atrial pressure of 3 mmHg.   Comparison(s): 04/08/15 EF 45-50%.   FINDINGS   Left Ventricle: My measurement of septum 14 mm. Left ventricular ejection  fraction, by estimation, is 55%. The left ventricle has normal function.  The left ventricle has no regional wall motion abnormalities. The average  left ventricular global  longitudinal strain is -23.3 %. The global longitudinal strain is normal.  The left ventricular internal cavity size was normal in size. There is no  left ventricular hypertrophy. Left ventricular diastolic parameters are  consistent with Grade I diastolic  dysfunction (impaired relaxation).   Right Ventricle: The right ventricular size is normal. No increase in  right ventricular wall thickness. Right ventricular systolic function is  normal.   Left Atrium: Left atrial size was moderately dilated.   Right Atrium: Right atrial size was normal in size.   Pericardium: There is no evidence of pericardial effusion.   Mitral Valve: The mitral valve is normal in structure. Trivial mitral  valve regurgitation. No evidence of mitral valve stenosis.   Tricuspid Valve: The tricuspid valve is normal in structure. Tricuspid  valve regurgitation is mild . No evidence of tricuspid stenosis.   Aortic Valve: Cannot tell how many leaflets some calcium but no AS by AVA  mean gradient 12 mmHg. The aortic valve was not well visualized. Aortic  valve regurgitation is not visualized. No aortic stenosis is present.  Aortic valve mean gradient measures  12.5 mmHg. Aortic valve peak gradient measures 23.6 mmHg. Aortic valve  area, by  VTI measures 2.07 cm.   Pulmonic Valve: The pulmonic valve was normal in structure. Pulmonic valve  regurgitation is not visualized. No evidence of pulmonic stenosis.   Aorta: Aortic dilatation noted. There is moderate dilatation of the aortic  root, measuring  42 mm. There is severe dilatation of the ascending aorta,  measuring 46 mm.   Venous: The inferior vena cava is normal in size with greater than 50%  respiratory variability, suggesting right atrial pressure of 3 mmHg.   IAS/Shunts: No atrial level shunt detected by color flow Doppler.     LEFT VENTRICLE  PLAX 2D  LVIDd:         3.10 cm   Diastology  LVIDs:         3.20 cm   LV e' medial:    4.33 cm/s  LV PW:         0.62 cm   LV E/e' medial:  9.8  LV IVS:        3.80 cm   LV e' lateral:   9.21 cm/s  LVOT diam:     2.60 cm   LV E/e' lateral: 4.6  LV SV:         99  LV SV Index:   44        2D Longitudinal Strain  LVOT Area:     5.31 cm  2D Strain GLS (A2C):   -18.9 %                           2D Strain GLS (A3C):   -25.5 %                           2D Strain GLS (A4C):   -25.6 %                           2D Strain GLS Avg:     -23.3 %                             3D Volume EF:                           3D EF:        57 %                           LV EDV:       206 ml                           LV ESV:       89 ml                           LV SV:        117 ml   RIGHT VENTRICLE             IVC  RV Basal diam:  4.50 cm     IVC diam: 1.50 cm  RV S prime:     10.70 cm/s  TAPSE (M-mode): 2.5 cm   LEFT ATRIUM             Index        RIGHT ATRIUM  Index  LA diam:        4.80 cm 2.13 cm/m   RA Pressure: 3.00 mmHg  LA Vol (A2C):   79.2 ml 35.19 ml/m  RA Area:     20.90 cm  LA Vol (A4C):   84.3 ml 37.45 ml/m  RA Volume:   51.20 ml  22.75 ml/m  LA Biplane Vol: 83.5 ml 37.10 ml/m   AORTIC VALVE  AV Area (Vmax):    2.13 cm  AV Area (Vmean):   2.19 cm  AV Area (VTI):     2.07 cm  AV Vmax:           243.00 cm/s  AV Vmean:          162.500 cm/s  AV VTI:            0.480 m  AV Peak Grad:      23.6 mmHg  AV Mean Grad:      12.5 mmHg  LVOT Vmax:         97.60 cm/s  LVOT Vmean:        66.900 cm/s  LVOT VTI:          0.187 m  LVOT/AV VTI ratio: 0.39    AORTA  Ao  Root diam: 4.20 cm  Ao Asc diam:  4.60 cm   MITRAL VALVE               TRICUSPID VALVE                             Estimated RAP:  3.00 mmHg  MV Decel Time: 317 msec  MV E velocity: 42.30 cm/s  SHUNTS  MV A velocity: 99.00 cm/s  Systemic VTI:  0.19 m  MV E/A ratio:  0.43        Systemic Diam: 2.60 cm   Jenkins Rouge MD  Electronically signed by Jenkins Rouge MD  Signature Date/Time: 02/06/2022/3:42:55 PM     CLINICAL DATA:  Dilated aortic root and ascending thoracic aorta by echocardiography.   EXAM: CT ANGIOGRAPHY CHEST WITH CONTRAST   TECHNIQUE: Multidetector CT imaging of the chest was performed using the standard protocol during bolus administration of intravenous contrast. Multiplanar CT image reconstructions and MIPs were obtained to evaluate the vascular anatomy.   RADIATION DOSE REDUCTION: This exam was performed according to the departmental dose-optimization program which includes automated exposure control, adjustment of the mA and/or kV according to patient size and/or use of iterative reconstruction technique.   CONTRAST:  33mL OMNIPAQUE IOHEXOL 350 MG/ML SOLN   COMPARISON:  Report from an echocardiogram dated 02/06/2022   FINDINGS: Cardiovascular: The aortic root measures approximately 3.8-4.0 cm by CT. The aortic valve is calcified. The ascending thoracic aorta is dilated and measures up to 4.6 cm in greatest estimated diameter. The proximal arch measures 3.7 cm and the distal arch 2.8 cm. The descending thoracic aorta measures 2.6 cm. Atherosclerosis present of the thoracic aorta. Visualized proximal great vessels demonstrate atherosclerosis at origins of left subclavian and left common carotid arteries without significant stenosis. Branching anatomy is normal.   The heart size is within normal limits. Calcified coronary artery plaque identified as well as evidence of prior PCI. No pericardial fluid identified. Central pulmonary arteries are mildly  dilated with the main pulmonary artery measuring 3.2 cm.   Mediastinum/Nodes: No enlarged mediastinal, hilar, or axillary lymph nodes. Thyroid gland, trachea, and esophagus demonstrate no significant findings.   Lungs/Pleura:  There is no evidence of pulmonary edema, consolidation, pneumothorax, nodule or pleural fluid.   Upper Abdomen: No acute abnormality.   Musculoskeletal: No chest wall abnormality. No acute or significant osseous findings.   Review of the MIP images confirms the above findings.   IMPRESSION: 1. Aneurysmal disease of the ascending thoracic aorta measuring up to approximately 4.6 cm in greatest diameter. Associated calcified aortic valve. Ascending thoracic aortic aneurysm. Recommend semi-annual imaging followup by CTA or MRA and referral to cardiothoracic surgery if not already obtained. This recommendation follows 2010 ACCF/AHA/AATS/ACR/ASA/SCA/SCAI/SIR/STS/SVM Guidelines for the Diagnosis and Management of Patients With Thoracic Aortic Disease. Circulation. 2010; 121: O962-X528. Aortic aneurysm NOS (ICD10-I71.9) 2. Coronary atherosclerosis with evidence of prior PCI. 3. Mildly dilated central pulmonary arteries may be consistent with some degree of underlying pulmonary hypertension.   Aortic aneurysm NOS (ICD10-I71.9).     Electronically Signed   By: Aletta Edouard M.D.   On: 03/30/2022 15:38  Impression / Plan:  Asymptomatic 4.6 thoracic aortic aneurysm in a 61 year old male identified on recent echocardiogram.  I explained to Mr. Lippy that intervention was not required at this point but we must continue with careful surveillance.  We also discussed the importance of avoiding strenuous activity to include no repetitive lifting of more than 40 pounds.  We also discussed the importance of ongoing careful blood pressure management as well as treatment of his dyslipidemia in order to minimize risks of further dilation.   His blood pressure was elevated  158/100 in the office today.  He is with adding losartan current antihypertensive regimen as recommended by Dr. Tamala Julian.   Will plan for office follow-up with CTA chest in 6 months.    Antony Odea, PA-C [facility] [phone]

## 2022-06-09 DIAGNOSIS — L814 Other melanin hyperpigmentation: Secondary | ICD-10-CM | POA: Diagnosis not present

## 2022-06-09 DIAGNOSIS — L578 Other skin changes due to chronic exposure to nonionizing radiation: Secondary | ICD-10-CM | POA: Diagnosis not present

## 2022-06-09 DIAGNOSIS — L820 Inflamed seborrheic keratosis: Secondary | ICD-10-CM | POA: Diagnosis not present

## 2022-06-09 DIAGNOSIS — L738 Other specified follicular disorders: Secondary | ICD-10-CM | POA: Diagnosis not present

## 2022-06-09 DIAGNOSIS — L821 Other seborrheic keratosis: Secondary | ICD-10-CM | POA: Diagnosis not present

## 2022-06-09 DIAGNOSIS — C4441 Basal cell carcinoma of skin of scalp and neck: Secondary | ICD-10-CM | POA: Diagnosis not present

## 2022-07-17 DIAGNOSIS — M1711 Unilateral primary osteoarthritis, right knee: Secondary | ICD-10-CM | POA: Diagnosis not present

## 2022-07-28 ENCOUNTER — Encounter: Payer: Self-pay | Admitting: Cardiology

## 2022-07-28 DIAGNOSIS — H0279 Other degenerative disorders of eyelid and periocular area: Secondary | ICD-10-CM | POA: Diagnosis not present

## 2022-07-28 DIAGNOSIS — H02413 Mechanical ptosis of bilateral eyelids: Secondary | ICD-10-CM | POA: Diagnosis not present

## 2022-07-28 DIAGNOSIS — H02834 Dermatochalasis of left upper eyelid: Secondary | ICD-10-CM | POA: Diagnosis not present

## 2022-07-28 DIAGNOSIS — H02423 Myogenic ptosis of bilateral eyelids: Secondary | ICD-10-CM | POA: Diagnosis not present

## 2022-07-28 DIAGNOSIS — H02831 Dermatochalasis of right upper eyelid: Secondary | ICD-10-CM | POA: Diagnosis not present

## 2022-08-16 DIAGNOSIS — H53483 Generalized contraction of visual field, bilateral: Secondary | ICD-10-CM | POA: Diagnosis not present

## 2022-09-12 DIAGNOSIS — Z0000 Encounter for general adult medical examination without abnormal findings: Secondary | ICD-10-CM | POA: Diagnosis not present

## 2022-09-27 ENCOUNTER — Encounter: Payer: Self-pay | Admitting: Cardiology

## 2022-09-27 ENCOUNTER — Ambulatory Visit: Payer: BC Managed Care – PPO | Attending: Cardiology | Admitting: Cardiology

## 2022-09-27 VITALS — BP 154/104 | HR 65 | Ht 72.0 in | Wt 218.8 lb

## 2022-09-27 DIAGNOSIS — I10 Essential (primary) hypertension: Secondary | ICD-10-CM

## 2022-09-27 DIAGNOSIS — I5042 Chronic combined systolic (congestive) and diastolic (congestive) heart failure: Secondary | ICD-10-CM

## 2022-09-27 DIAGNOSIS — I25118 Atherosclerotic heart disease of native coronary artery with other forms of angina pectoris: Secondary | ICD-10-CM

## 2022-09-27 DIAGNOSIS — I214 Non-ST elevation (NSTEMI) myocardial infarction: Secondary | ICD-10-CM

## 2022-09-27 DIAGNOSIS — I712 Thoracic aortic aneurysm, without rupture, unspecified: Secondary | ICD-10-CM | POA: Insufficient documentation

## 2022-09-27 DIAGNOSIS — I255 Ischemic cardiomyopathy: Secondary | ICD-10-CM | POA: Diagnosis not present

## 2022-09-27 DIAGNOSIS — I2510 Atherosclerotic heart disease of native coronary artery without angina pectoris: Secondary | ICD-10-CM

## 2022-09-27 DIAGNOSIS — Z95820 Peripheral vascular angioplasty status with implants and grafts: Secondary | ICD-10-CM

## 2022-09-27 DIAGNOSIS — E785 Hyperlipidemia, unspecified: Secondary | ICD-10-CM

## 2022-09-27 DIAGNOSIS — Z01810 Encounter for preprocedural cardiovascular examination: Secondary | ICD-10-CM

## 2022-09-27 DIAGNOSIS — I7121 Aneurysm of the ascending aorta, without rupture: Secondary | ICD-10-CM

## 2022-09-27 MED ORDER — LOSARTAN POTASSIUM 50 MG PO TABS: 50.0000 mg | ORAL_TABLET | Freq: Every day | ORAL | 3 refills | Status: DC

## 2022-09-27 NOTE — Progress Notes (Unsigned)
Primary Care Provider: Audie Pinto, FNP [facility] Cardiologist: Bryan Lemma, MD Electrophysiologist: None  Clinic Note: No chief complaint on file.   ===================================  ASSESSMENT/PLAN   Problem List Items Addressed This Visit       Cardiology Problems   Thoracic aortic aneurysm [facility])   Relevant Medications   losartan (COZAAR) 25 MG tablet   Non-STEMI (non-ST elevated myocardial infarction) (HCC)   Relevant Medications   losartan (COZAAR) 25 MG tablet   Ischemic cardiomyopathy   Relevant Medications   losartan (COZAAR) 25 MG tablet   Hypertension, essential (Chronic)   Relevant Medications   losartan (COZAAR) 25 MG tablet   Chronic combined systolic and diastolic heart failure, NYHA class 2 (HCC)   Relevant Medications   losartan (COZAAR) 25 MG tablet   CAD (coronary artery disease), native coronary artery - Primary   Relevant Medications   losartan (COZAAR) 25 MG tablet    ===================================  HPI:    David Chang is a 62 y.o. male with a history of MV-CAD (including RCA, LCx and LAD-2016, mid to distal RCA PCI in January 2022 for abnormality), HTN, HLD, chronic aortic dilation and mild CM (EF 45 to 50%) who is being seen today for the 28-month follow-up-establish new cardiologist after Dr. Katrinka Blazing retiring).  He returns at the request of Audie Pinto, FNP.  David Chang was last seen on January 25, 2022 by Dr. Katrinka Blazing.  No complaints happy with his level of activity.  Denied any dyspnea or angina.  Excellent exercise tolerance. => Discontinued aspirin and continue Plavix.  Ordered Echo; continued on Crestor 20 mg (LDL was 71).  CKD 3B (Cr 1.8) -considered stopping chlorthalidone and adding SGLT2 => plan was to follow-up in [facility] with Dr. Bing Matter -> 8-month follow-up.Marland Kitchen  Recent Hospitalizations: none  Reviewed  CV studies:    The following studies were reviewed today: (if available, images/films  reviewed: From Epic Chart or Care Everywhere) Cardiac Cath-PCI 04/08/2015: Severe three-vessel CAD: mLAD 90% (Promus Premier DES 3.5 x 16-3.8 mm); mLCx 95% (Promus Premier DES 3.0 x 16 - 3.4 mm); pRCA 90% (Promus Premier DES 4.0 x 20 -> 4.3 mm), mRCA 45%. Cardiac Cath 05/07/2020: mLCx 100% CTO (OM 3 fills via right to left collaterals, widely patent mid LAD stent with moderate diffuse disease throughout the LAD.  Dominant RCA with shepherd's crook takeoff, proximal stent patent followed by segmental 60% mid vessel stenosis and then a focal 85% stenosis in the  distal vessel just beyond the bend. => Staged PCI.  EF was 55% with EDP of 13 mmHg. Cardiac Cath-PCI 06/15/2020: Mid-distal RCA PCI-resolute Onyx DES 3.5 x 34 (postdilated with 3.75 and 4.0 Marion balloons. Diagnostic Dominance: Right     Intervention  TTE 02/06/2022: EF~55%.  Normal wall motion.  GR 1 DD.  Normal RV.  Moderate LA dilation consistent with diastolic function.  Trivial MR.  Aortic valve sclerosis with no stenosis.  Interval History:   David Chang   CV Review of Symptoms (Summary): {roscv:310661}  REVIEWED OF SYSTEMS   ROS  I have reviewed and (if needed) personally updated the patient's problem list, medications, allergies, past medical and surgical history, social and family history.   PAST MEDICAL HISTORY   Past Medical History:  Diagnosis Date   Arthritis    Coronary artery disease    Hyperlipidemia LDL goal <70 04/10/2015   Hypertension    Ischemic cardiomyopathy 04/10/2015   S/P angioplasty with stent to mLAD  DES, pRCA DES, mLCX DES 04/08/15  04/10/2015    PAST SURGICAL HISTORY   Past Surgical History:  Procedure Laterality Date   ACHILLES TENDON SURGERY Left 12/06/2017   Procedure: Complete Left Achilles tendon repair; dermaspan graf, application of cast;  Surgeon: Ranee Gosselin, MD;  Location: [facility] ORS;  Service: Orthopedics;  Laterality: Left;   achillies repair     and medial gastrocnemius tear  repair   CARDIAC CATHETERIZATION N/A 04/08/2015   Procedure: Left Heart Cath and Coronary Angiography;  Surgeon: Marykay Lex, MD;  Location: [facility] INVASIVE CV LAB;  Service: Cardiovascular;  Laterality: N/A;   CORONARY ANGIOGRAPHY N/A 06/15/2020   Procedure: CORONARY ANGIOGRAPHY;  Surgeon: Lyn Records, MD;  Location: [facility] INVASIVE CV LAB;  Service: Cardiovascular;  Laterality: N/A;   CORONARY STENT INTERVENTION N/A 06/15/2020   Procedure: CORONARY STENT INTERVENTION;  Surgeon: Lyn Records, MD;  Location: [facility] INVASIVE CV LAB;  Service: Cardiovascular;  Laterality: N/A;   KNEE SURGERY Left    LEFT HEART CATH AND CORONARY ANGIOGRAPHY N/A 05/07/2020   Procedure: LEFT HEART CATH AND CORONARY ANGIOGRAPHY;  Surgeon: Lyn Records, MD;  Location: [facility] INVASIVE CV LAB;  Service: Cardiovascular;  Laterality: N/A;   left tricep surgery      Right tricep repair also   TOTAL KNEE ARTHROPLASTY Left 04/24/2016   Procedure: LEFT TOTAL KNEE ARTHROPLASTY;  Surgeon: Ollen Gross, MD;  Location: [facility] ORS;  Service: Orthopedics;  Laterality: Left;     There is no immunization history on file for this patient.  MEDICATIONS/ALLERGIES   Current Meds  Medication Sig   amLODipine (NORVASC) 10 MG tablet TAKE 1 TABLET BY MOUTH ONCE DAILY   chlorthalidone (HYGROTON) 25 MG tablet TAKE 1/2 TABLET(12.5 MG) BY MOUTH DAILY   clopidogrel (PLAVIX) 75 MG tablet TAKE ONE TABLET BY MOUTH DAILY   Glycerin-Hypromellose-PEG 400 (DRY EYE RELIEF DROPS) 0.2-0.2-1 % SOLN Place 1 drop into both eyes daily as needed (Dry eye).   losartan (COZAAR) 25 MG tablet Take 25 mg by mouth daily.   metoprolol (TOPROL-XL) 200 MG 24 hr tablet TAKE 1 TABLET(200 MG) BY MOUTH DAILY   milk thistle 175 MG tablet Take 175 mg by mouth daily.   nitroGLYCERIN (NITROSTAT) 0.4 MG SL tablet Place 1 tablet (0.4 mg total) under the tongue every 5 (five) minutes as needed for chest pain.   Omega-3 Fatty Acids (FISH OIL) 1000 MG CAPS Take 1,000 mg by mouth  daily.   pantoprazole (PROTONIX) 20 MG tablet Take 20 mg by mouth daily as needed for heartburn or indigestion.   rosuvastatin (CRESTOR) 20 MG tablet Take 1 tablet (20 mg total) by mouth daily.   traZODone (DESYREL) 50 MG tablet Take 50-100 mg by mouth at bedtime as needed for sleep.    Allergies  Allergen Reactions   Tramadol Other (See Comments)    Gi upset    SOCIAL HISTORY/FAMILY HISTORY   Reviewed in Epic:   Social History   Tobacco Use   Smoking status: Never   Smokeless tobacco: Never  Vaping Use   Vaping Use: Never used  Substance Use Topics   Alcohol use: Yes    Alcohol/week: 0.0 standard drinks of alcohol    Comment: casual drinking on Saturday    Drug use: No   Social History   Social History Narrative   Not on file   Family History  Problem Relation Age of Onset   CVA Other    Cancer Father 64  LUNG   Heart attack Paternal Grandfather 33   Hypertension Sister   GU hydrate berry evaluate for hip symmetric with  OBJCTIVE -PE, EKG, labs   Wt Readings from Last 3 Encounters:  09/27/22 218 lb 12.8 oz (99.2 kg)  05/18/22 227 lb (103 kg)  01/25/22 227 lb 9.6 oz (103.2 kg)    Physical Exam: BP (!) 154/104   Pulse 65   Ht 6' (1.829 m)   Wt 218 lb 12.8 oz (99.2 kg)   BMI 29.67 kg/m  Physical Exam   Adult ECG Report  Rate: *** ;  Rhythm: {rhythm:17366};   Narrative Interpretation: ***  Recent Labs:   09/12/2022: TC 106, TG 85, HDL 33, LDL 56 Lab Results  Component Value Date   CHOL 125 07/28/2020   HDL 40 07/28/2020   LDLCALC 69 07/28/2020   TRIG 80 07/28/2020   CHOLHDL 3.1 07/28/2020   Lab Results  Component Value Date   CREATININE 1.22 07/28/2020   BUN 12 07/28/2020   NA 138 07/28/2020   K 4.5 07/28/2020   CL 102 07/28/2020   CO2 24 07/28/2020      Latest Ref Rng & Units 06/30/2020    2:44 PM 06/16/2020   12:34 AM 06/15/2020    9:00 PM  CBC  WBC 3.4 - 10.8 x10E3/uL 8.2  8.8  9.5   Hemoglobin 13.0 - 17.7 g/dL 16.1  09.6  04.5    Hematocrit 37.5 - 51.0 % 38.2  36.4  40.7   Platelets 150 - 450 x10E3/uL 141  137  152     Lab Results  Component Value Date   HGBA1C 5.6 04/08/2015   Lab Results  Component Value Date   TSH 2.732 04/08/2015    ================================================== I spent a total of *** minutes with the patient spent in direct patient consultation.  Additional time spent with chart review  / charting (studies, outside notes, etc): *** min Total Time: *** min  Current medicines are reviewed at length with the patient today.  (+/- concerns) ***  Notice: This dictation was prepared with Dragon dictation along with smart phrase technology. Any transcriptional errors that result from this process are unintentional and may not be corrected upon review.   Studies Ordered:  No orders of the defined types were placed in this encounter.  No orders of the defined types were placed in this encounter.   Patient Instructions / Medication Changes & Studies & Tests Ordered   Patient Instructions  Medication Instructions:    *If you need a refill on your cardiac medications before your next appointment, please call your pharmacy*   Lab Work:  If you have labs (blood work) drawn today and your tests are completely normal, you will receive your results only by: MyChart Message (if you have MyChart) OR A paper copy in the mail If you have any lab test that is abnormal or we need to change your treatment, we will call you to review the results.   Testing/Procedures:    Follow-Up: At [facility], you and your health needs are our priority.  As part of our continuing mission to provide you with exceptional heart care, we have created designated Provider Care Teams.  These Care Teams include your primary Cardiologist (physician) and Advanced Practice Providers (APPs -  Physician Assistants and Nurse Practitioners) who all work together to provide you with the care you need, when you  need it.     Your next appointment:   {numbers 1-12:10294} {  Time; day/wk/mo/yr(s):9076}  The format for your next appointment:   {F/U Visit Format          :[id number]}  Provider:   {Providers/Teams        :[id number]}   Other Instructions     Marykay Lex, MD, MS Bryan Lemma, M.D., M.S. Interventional Cardiologist  [facility]  Pager # [phone] Phone # [phone] [address] [place], [place] [address]   Thank you for choosing [facility] [place]!!

## 2022-09-27 NOTE — Patient Instructions (Addendum)
Medication Instructions:   Increase Losartan to 50 mg      Start taking Toprol ( Metoprolol ) at bedtime , all othe medication in morning  -Amlodipine ,chorthalidone  *If you need a refill on your cardiac medications before your next appointment, please call your pharmacy*   Lab Work:  Not needed    Testing/Procedures: Not needed   Follow-Up: At [facility], you and your health needs are our priority.  As part of our continuing mission to provide you with exceptional heart care, we have created designated Provider Care Teams.  These Care Teams include your primary Cardiologist (physician) and Advanced Practice Providers (APPs -  Physician Assistants and Nurse Practitioners) who all work together to provide you with the care you need, when you need it.     Your next appointment:   8 month(s)  The format for your next appointment:   In Person  Provider:   Bryan Lemma, MD    Other Instructions   Contact  vascular surgeons office for appt in June 2024    Okay form a cardiac standpoint to have knee surgery - please have  Ortho contact office for  official  clearance

## 2022-09-28 ENCOUNTER — Encounter: Payer: Self-pay | Admitting: Cardiology

## 2022-09-28 DIAGNOSIS — Z01810 Encounter for preprocedural cardiovascular examination: Secondary | ICD-10-CM | POA: Insufficient documentation

## 2022-09-28 NOTE — Assessment & Plan Note (Signed)
Should be due to have labs checked soon.  Last lipids were from 2022 showed an LDL of 69.  On modest dose rosuvastatin 20 mg daily, tolerating well.

## 2022-09-28 NOTE — Assessment & Plan Note (Signed)
BP higher than we would like even with his home recordings being in the 135/85 to 150/90 range, I think we can go and titrate up losartan to 50 mg with potential to increase further to 100 mg versus switch to a more potent ARB in follow-up.  Take amlodipine along with losartan and chlorthalidone in the morning and then take the high-dose of Toprol in the evening.

## 2022-09-28 NOTE — Assessment & Plan Note (Signed)
Continues to do well with no active anginal symptoms.  Has done well since his last PCI on current meds:  Plan: On stable dose of 200 mg Toprol (heart rate 65 with no signs or symptoms of current show incompetence), along with 10 mg amlodipine for antianginal benefit. Recommend taking losartan Nexletol in the morning along with amlodipine Take Toprol at p.m. Additionally on losartan 25 mg daily for her after reduction/BP-will increase to 50 mg daily.  (Take in combination with chlorthalidone 25 mg every morning) Continue moderate dose rosuvastatin with lipids well-controlled With extensive PCI, continue Plavix monotherapy lifelong. Okay to hold for procedures.

## 2022-09-28 NOTE — Assessment & Plan Note (Addendum)
He should be due for follow-up either CTA or MRA and then 38-month follow-up with CVTS arguably in June.  Will send message to [facility] to see if he is truly scheduled and if the study has been ordered.  Continue aggressive blood pressure control and statin plus Plavix for CAD/PAD/aortic disease risk factor modification.Marland Kitchen

## 2022-09-28 NOTE — Assessment & Plan Note (Signed)
Has mild chronic HFmrEF with EF in the 45% range likely related to mild ischemic cardiomyopathy after the circumflex was occluded.  Not having any active anginal symptoms.  Is on ARB and beta-blocker as well as calcium channel blocker for blood pressure/afterload reduction. Increase losartan to 50 mg daily for better blood pressure control.  Euvolemic on exam with chlorthalidone.  Not requiring loop diuretic.  NYHA class I symptoms.

## 2022-09-28 NOTE — Assessment & Plan Note (Signed)
Remains on Plavix monotherapy given extensive PCI of the LAD and RCA with already having 1 stent closed.  Plan: Continue long-term/lifelong Thienopyridine (Plavix 75 mg daily). Okay to hold Plavix 5 to 7 days preop for surgeries or procedures.

## 2022-10-04 ENCOUNTER — Other Ambulatory Visit: Payer: Self-pay | Admitting: Surgery

## 2022-10-04 DIAGNOSIS — I7121 Aneurysm of the ascending aorta, without rupture: Secondary | ICD-10-CM

## 2022-10-17 DIAGNOSIS — M1711 Unilateral primary osteoarthritis, right knee: Secondary | ICD-10-CM | POA: Diagnosis not present

## 2022-11-04 ENCOUNTER — Other Ambulatory Visit: Payer: Self-pay | Admitting: Physician Assistant

## 2022-11-09 NOTE — Progress Notes (Signed)
[address]       [place] [address]             [phone]      ADRIANNE HULME [id number] 05-08-1961  History of Present Illness:  David Chang is a 62 yo male with history of HTN, HLD, CAD, Ischemic Cardiomyopathy, and Ascending Aortic Aneurysm.  He presents today for 6 month follow at which time Ascending Aortic Aneurysm measured 4.6 cm in December of 2023.  Overall patient is doing well.  He denies chest pain and shortness of breath.  He remains physically active, but is no longer lifting weights or doing super strenuous workouts.  He does use cardio machines.  He remains compliant on his BP medication.  Current Outpatient Medications on File Prior to Visit  Medication Sig Dispense Refill   amLODipine (NORVASC) 10 MG tablet TAKE 1 TABLET BY MOUTH ONCE DAILY 90 tablet 2   chlorthalidone (HYGROTON) 25 MG tablet TAKE 1/2 TABLET(12.5 MG) BY MOUTH DAILY 45 tablet 3   clopidogrel (PLAVIX) 75 MG tablet TAKE ONE TABLET BY MOUTH DAILY 90 tablet 1   Glycerin-Hypromellose-PEG 400 (DRY EYE RELIEF DROPS) 0.2-0.2-1 % SOLN Place 1 drop into both eyes daily as needed (Dry eye).     losartan (COZAAR) 50 MG tablet Take 1 tablet (50 mg total) by mouth daily. 90 tablet 3   metoprolol (TOPROL-XL) 200 MG 24 hr tablet TAKE 1 TABLET(200 MG) BY MOUTH DAILY 90 tablet 3   milk thistle 175 MG tablet Take 175 mg by mouth daily.     nitroGLYCERIN (NITROSTAT) 0.4 MG SL tablet Place 1 tablet (0.4 mg total) under the tongue every 5 (five) minutes as needed for chest pain. 25 tablet 4   Omega-3 Fatty Acids (FISH OIL) 1000 MG CAPS Take 1,000 mg by mouth daily.     pantoprazole (PROTONIX) 20 MG tablet Take 20 mg by mouth daily as needed for heartburn or indigestion.     rosuvastatin (CRESTOR) 20 MG tablet Take 1 tablet (20 mg total) by mouth daily. 90 tablet 2   traZODone (DESYREL) 50 MG tablet Take 50-100 mg by mouth at bedtime as needed for sleep.     No current facility-administered  medications on file prior to visit.     BP (!) 140/90   Pulse 72   Resp 20   Ht 6' (1.829 m)   Wt 219 lb (99.3 kg)   SpO2 97% Comment: RA  BMI 29.70 kg/m   Physical Exam  Gen: NAD Heart: RRR Lungs: CTA bilaterally Neck: No Bruit Ext: no edema Neuro: grossly intact  CTA Results:  Cardiovascular: Normal heart size. No pericardial effusion. Moderate aortic valve calcifications. Dilated ascending thoracic aorta measuring 4.5 x 4.6 cm, unchanged when compared with the prior exam. Limited evaluation of the aortic root due to cardiac motion, grossly unchanged in size. Normal caliber aortic arch and descending thoracic aorta severe left main and three-vessel coronary artery calcifications.   Mediastinum/Nodes: Esophagus and thyroid are unremarkable. No pathologically enlarged lymph nodes seen in the chest.   Lungs/Pleura: Central airways are patent. Mild bibasilar atelectasis. No consolidation, pleural effusion or pneumothorax.   Upper Abdomen: Scattered low-attenuation liver lesions. Partially visualized simple appearing cyst of the left kidney. Additional bilateral low-attenuation renal lesions are seen which are too small to accurately characterize.   Musculoskeletal: No chest wall abnormality. No acute or significant osseous findings.   Review of the MIP images confirms the above findings.  IMPRESSION: 1. Dilated ascending thoracic aorta measuring up to 4.6 cm, unchanged when compared with the prior exam. Ascending thoracic aortic aneurysm. Recommend semi-annual imaging followup by CTA or MRA and referral to cardiothoracic surgery if not already obtained. This recommendation follows 2010 ACCF/AHA/AATS/ACR/ASA/SCA/SCAI/SIR/STS/SVM Guidelines for the Diagnosis and Management of Patients With Thoracic Aortic Disease. Circulation. 2010; 121: Z610-R604. Aortic aneurysm NOS (ICD10-I71.9) 2. Aortic valve calcifications, coronary artery calcifications and aortic  Atherosclerosis (ICD10-I70.0).    Electronically Signed   By: Allegra Lai M.D.   On: 11/20/2022 15:23    A/P:  Ascending Aortic Aneurysm- stable at 4.6 cm- repeat CTA in 6 months HTN- controlled today on multiple agents CAD Cardiomyopathy    Risk Modification:  Statin:  Yes  Patient was counseled on importance of Blood Pressure Control.  Despite Medical intervention if the patient notices persistently elevated blood pressure readings.  They are instructed to contact their Primary Care Physician  Patient educated on continued importance of not lifting weights, HIT training  Please avoid use of Fluoroquinolones as this can potentially increase your risk of Aortic Rupture and/or Dissection  Patient educated on signs and symptoms of Aortic Dissection, handout also provided in AVS  Sharel Behne, PA-C 11/20/22

## 2022-11-20 ENCOUNTER — Ambulatory Visit
Admission: RE | Admit: 2022-11-20 | Discharge: 2022-11-20 | Disposition: A | Discharge status: Home / Self Care | Payer: BC Managed Care – PPO | Source: Ambulatory Visit | Attending: Surgery | Admitting: Surgery

## 2022-11-20 ENCOUNTER — Ambulatory Visit: Payer: BC Managed Care – PPO | Admitting: Physician Assistant

## 2022-11-20 VITALS — BP 140/90 | HR 72 | Resp 20 | Ht 72.0 in | Wt 219.0 lb

## 2022-11-20 DIAGNOSIS — I700 Atherosclerosis of aorta: Secondary | ICD-10-CM | POA: Diagnosis not present

## 2022-11-20 DIAGNOSIS — I7121 Aneurysm of the ascending aorta, without rupture: Secondary | ICD-10-CM

## 2022-11-20 MED ORDER — IOPAMIDOL (ISOVUE-370) INJECTION 76%
75.0000 mL | Freq: Once | INTRAVENOUS | Status: AC | PRN
  Administered 2022-11-20: 50 mL via INTRAVENOUS

## 2022-11-28 DIAGNOSIS — H02413 Mechanical ptosis of bilateral eyelids: Secondary | ICD-10-CM | POA: Diagnosis not present

## 2022-11-28 DIAGNOSIS — H02831 Dermatochalasis of right upper eyelid: Secondary | ICD-10-CM | POA: Diagnosis not present

## 2022-11-28 DIAGNOSIS — H02834 Dermatochalasis of left upper eyelid: Secondary | ICD-10-CM | POA: Diagnosis not present

## 2022-11-28 DIAGNOSIS — H02423 Myogenic ptosis of bilateral eyelids: Secondary | ICD-10-CM | POA: Diagnosis not present

## 2022-12-03 ENCOUNTER — Emergency Department (HOSPITAL_COMMUNITY)
Admission: EM | Admit: 2022-12-03 | Discharge: 2022-12-03 | Disposition: A | Discharge status: Home / Self Care | Payer: BC Managed Care – PPO | Attending: Emergency Medicine | Admitting: Emergency Medicine

## 2022-12-03 ENCOUNTER — Encounter (HOSPITAL_COMMUNITY): Payer: Self-pay

## 2022-12-03 DIAGNOSIS — H59321 Postprocedural hemorrhage and hematoma of right eye and adnexa following other procedure: Secondary | ICD-10-CM | POA: Diagnosis not present

## 2022-12-03 DIAGNOSIS — H3561 Retinal hemorrhage, right eye: Secondary | ICD-10-CM | POA: Diagnosis not present

## 2022-12-03 DIAGNOSIS — Z7901 Long term (current) use of anticoagulants: Secondary | ICD-10-CM | POA: Insufficient documentation

## 2022-12-03 DIAGNOSIS — R58 Hemorrhage, not elsewhere classified: Secondary | ICD-10-CM

## 2022-12-03 LAB — CBC WITH DIFFERENTIAL/PLATELET
Abs Immature Granulocytes: 0.22 10*3/uL — ABNORMAL HIGH (ref 0.00–0.07)
Basophils Absolute: 0.1 10*3/uL (ref 0.0–0.1)
Basophils Relative: 1 %
Eosinophils Absolute: 0.2 10*3/uL (ref 0.0–0.5)
Eosinophils Relative: 1 %
HCT: 53.6 % — ABNORMAL HIGH (ref 39.0–52.0)
Hemoglobin: 17.7 g/dL — ABNORMAL HIGH (ref 13.0–17.0)
Immature Granulocytes: 2 %
Lymphocytes Relative: 13 %
Lymphs Abs: 1.7 10*3/uL (ref 0.7–4.0)
MCH: 32.6 pg (ref 26.0–34.0)
MCHC: 33 g/dL (ref 30.0–36.0)
MCV: 98.7 fL (ref 80.0–100.0)
Monocytes Absolute: 1 10*3/uL (ref 0.1–1.0)
Monocytes Relative: 8 %
Neutro Abs: 10 10*3/uL — ABNORMAL HIGH (ref 1.7–7.7)
Neutrophils Relative %: 75 %
Platelets: 154 10*3/uL (ref 150–400)
RBC: 5.43 MIL/uL (ref 4.22–5.81)
RDW: 12.5 % (ref 11.5–15.5)
WBC: 13.1 10*3/uL — ABNORMAL HIGH (ref 4.0–10.5)
nRBC: 0 % (ref 0.0–0.2)

## 2022-12-03 LAB — BASIC METABOLIC PANEL
Anion gap: 13 (ref 5–15)
BUN: 22 mg/dL (ref 8–23)
CO2: 26 mmol/L (ref 22–32)
Calcium: 10 mg/dL (ref 8.9–10.3)
Chloride: 97 mmol/L — ABNORMAL LOW (ref 98–111)
Creatinine, Ser: 1.66 mg/dL — ABNORMAL HIGH (ref 0.61–1.24)
GFR, Estimated: 46 mL/min — ABNORMAL LOW (ref >60)
Glucose, Bld: 103 mg/dL — ABNORMAL HIGH (ref 70–99)
Potassium: 4.2 mmol/L (ref 3.5–5.1)
Sodium: 136 mmol/L (ref 135–145)

## 2022-12-03 LAB — PROTIME-INR
INR: 0.9 (ref 0.8–1.2)
Prothrombin Time: 12.7 seconds (ref 11.4–15.2)

## 2022-12-03 MED ORDER — HYDRALAZINE HCL 20 MG/ML IJ SOLN
5.0000 mg | INTRAMUSCULAR | Status: AC
  Administered 2022-12-03: 5 mg via INTRAVENOUS
  Filled 2022-12-03: qty 1

## 2022-12-03 MED ORDER — TRANEXAMIC ACID-NACL 1000-0.7 MG/100ML-% IV SOLN
1000.0000 mg | Freq: Once | INTRAVENOUS | Status: AC
  Administered 2022-12-03: 1000 mg via INTRAVENOUS
  Filled 2022-12-03: qty 100

## 2022-12-03 MED ORDER — LIDOCAINE-EPINEPHRINE 2 %-1:100000 IJ SOLN
1.7000 mL | Freq: Once | INTRAMUSCULAR | Status: DC
  Filled 2022-12-03: qty 1.7

## 2022-12-03 MED ORDER — TETRACAINE HCL 0.5 % OP SOLN
1.0000 [drp] | Freq: Once | OPHTHALMIC | Status: AC
  Administered 2022-12-03: 1 [drp] via OPHTHALMIC
  Filled 2022-12-03: qty 4

## 2022-12-03 MED ORDER — PHENYLEPHRINE HCL 10 % OP SOLN
2.0000 [drp] | Freq: Once | OPHTHALMIC | Status: AC
  Administered 2022-12-03: 2 [drp] via OPHTHALMIC
  Filled 2022-12-03: qty 5

## 2022-12-03 MED ORDER — TRANEXAMIC ACID FOR EPISTAXIS
500.0000 mg | Freq: Once | TOPICAL | Status: DC
  Filled 2022-12-03: qty 10

## 2022-12-03 NOTE — ED Provider Notes (Signed)
[name] EMERGENCY DEPARTMENT AT [facility] Provider Note   CSN: [id number] Arrival date & time: 12/03/22  1001     History  No chief complaint on file.   David Chang is a 62 y.o. male.  HPI Patient presents with concern of bleeding from his right eye.  For 4 days ago the patient had eye lift procedure.  Today he awoke with bleeding from his right eye which has been essentially continuous since that time.  No lightheadedness, no vision changes that are discernible, no actual eyeball pain.  No difficulty swallowing, speaking.  Patient is accompanied by his family members to assist with the history.     Home Medications Prior to Admission medications   Medication Sig Start Date End Date Taking? Authorizing Provider  amLODipine (NORVASC) 10 MG tablet TAKE 1 TABLET BY MOUTH ONCE DAILY 12/29/16   Lyn Records, MD  chlorthalidone (HYGROTON) 25 MG tablet TAKE 1/2 TABLET(12.5 MG) BY MOUTH DAILY 04/24/22   Lyn Records, MD  clopidogrel (PLAVIX) 75 MG tablet TAKE ONE TABLET BY MOUTH DAILY 05/07/17   Lyn Records, MD  Glycerin-Hypromellose-PEG 400 (DRY EYE RELIEF DROPS) 0.2-0.2-1 % SOLN Place 1 drop into both eyes daily as needed (Dry eye).    [provider]  losartan (COZAAR) 50 MG tablet Take 1 tablet (50 mg total) by mouth daily. 09/27/22 12/26/22  Marykay Lex, MD  metoprolol (TOPROL-XL) 200 MG 24 hr tablet TAKE 1 TABLET(200 MG) BY MOUTH DAILY 04/17/22   Lyn Records, MD  milk thistle 175 MG tablet Take 175 mg by mouth daily.    [provider]  nitroGLYCERIN (NITROSTAT) 0.4 MG SL tablet Place 1 tablet (0.4 mg total) under the tongue every 5 (five) minutes as needed for chest pain. 05/04/20   Lyn Records, MD  Omega-3 Fatty Acids (FISH OIL) 1000 MG CAPS Take 1,000 mg by mouth daily.    [provider]  pantoprazole (PROTONIX) 20 MG tablet Take 20 mg by mouth daily as needed for heartburn or indigestion. 03/08/20   [provider]   rosuvastatin (CRESTOR) 20 MG tablet Take 1 tablet (20 mg total) by mouth daily. 04/18/22   Lyn Records, MD  traZODone (DESYREL) 50 MG tablet Take 50-100 mg by mouth at bedtime as needed for sleep.    [provider]      Allergies    Tramadol    Review of Systems   Review of Systems  Unable to perform ROS: Acuity of condition    Physical Exam Updated Vital Signs SpO2 99%  Physical Exam Vitals and nursing note reviewed.  Constitutional:      Appearance: He is well-developed.  HENT:     Head: Normocephalic and atraumatic.  Eyes:     Conjunctiva/sclera: Conjunctivae normal.   Cardiovascular:     Rate and Rhythm: Normal rate and regular rhythm.  Pulmonary:     Effort: Pulmonary effort is normal. No respiratory distress.     Breath sounds: No stridor.  Abdominal:     General: There is no distension.  Skin:    General: Skin is warm and dry.  Neurological:     Mental Status: He is alert and oriented to person, place, and time.     ED Results / Procedures / Treatments   Labs (all labs ordered are listed, but only abnormal results are displayed) Labs Reviewed  CBC WITH DIFFERENTIAL/PLATELET - Abnormal; Notable for the following components:  Result Value   WBC 13.1 (*)    Hemoglobin 17.7 (*)    HCT 53.6 (*)    Neutro Abs 10.0 (*)    Abs Immature Granulocytes 0.22 (*)    All other components within normal limits  BASIC METABOLIC PANEL - Abnormal; Notable for the following components:   Chloride 97 (*)    Glucose, Bld 103 (*)    Creatinine, Ser 1.66 (*)    GFR, Estimated 46 (*)    All other components within normal limits  PROTIME-INR    EKG None  Radiology No results found.  Procedures Procedures    Medications Ordered in ED Medications  tranexamic acid (CYKLOKAPRON) 1000 MG/10ML topical solution 500 mg (has no administration in time range)  lidocaine-EPINEPHrine (XYLOCAINE W/EPI) 2 %-1:100000 (with pres) injection 1.7 mL (has no  administration in time range)    ED Course/ Medical Decision Making/ A&P                             Medical Decision Making Patient presents 5 days after cosmetic procedure now with concern for bleeding from his eyelid.  Patient is awake, alert, hypertensive rather than hypotensive and tachycardic, though he is actively bleeding, little initial evidence for exsanguination/shock. Suspicion for surgical site disruption versus minor trauma. After patient arrived he was placed on continuous monitoring, pressure applied to the eye, and I discussed the case with his ophthalmologist. Cardiac 80 sinus normal Pulse ox 100% room air normal Patient will receive phenylephrine ophthalmologic drops 2 drops, twice, with continuous pressure application.   Amount and/or Complexity of Data Reviewed Independent Historian: spouse    Details: At bedside Labs: ordered. Decision-making details documented in ED Course.  Risk Prescription drug management. Decision regarding hospitalization.   After interventions including phenylephrine 10% x 2, topical anesthetic, and pressure, as well as cold packs, the patient had continued recurrence of bleeding.  Blood counts unremarkable, mild leukocytosis consistent with ongoing process.  Patient also received hydralazine in an effort to decrease blood pressure for better hemostasis.  Patient's blood pressure did diminish somewhat, but with persistent bleeding in spite of all of the above interventions I again discussed the patient's case with his ophthalmologist.  Patient had pressure dressing applied, and after lengthy conversation with his family members, him, the patient was discharged from this facility to go straight to the ophthalmology office for anticipated cautery.  Dr. Dimas Millin will see the patient immediately.  CRITICAL CARE Performed by: Gerhard Munch Total critical care time: 35 minutes Critical care time was exclusive of separately billable procedures  and treating other patients. Critical care was necessary to treat or prevent imminent or life-threatening deterioration. Critical care was time spent personally by me on the following activities: development of treatment plan with patient and/or surrogate as well as nursing, discussions with consultants, evaluation of patient's response to treatment, examination of patient, obtaining history from patient or surrogate, ordering and performing treatments and interventions, ordering and review of laboratory studies, ordering and review of radiographic studies, pulse oximetry and re-evaluation of patient's condition.  Final Clinical Impression(s) / ED Diagnoses Final diagnoses:  Bleeding     Gerhard Munch, MD 12/03/22 1154

## 2022-12-03 NOTE — Discharge Instructions (Addendum)
Please proceed immediately to your ophthalmologist's office in [place].  Return here for concerning changes in your condition.

## 2022-12-03 NOTE — ED Triage Notes (Addendum)
Pt reports getting an eye lift procedure done on Tuesday, thinks he lost a stitch. Pt woke up at 0600 this morning and noticed right eye bleeding.

## 2023-01-15 ENCOUNTER — Other Ambulatory Visit: Payer: Self-pay | Admitting: *Deleted

## 2023-01-15 MED ORDER — ROSUVASTATIN CALCIUM 20 MG PO TABS: 20.0000 mg | ORAL_TABLET | Freq: Every day | ORAL | 3 refills | Status: AC

## 2023-02-21 DIAGNOSIS — M1711 Unilateral primary osteoarthritis, right knee: Secondary | ICD-10-CM | POA: Diagnosis not present

## 2023-03-19 ENCOUNTER — Telehealth: Payer: Self-pay | Admitting: *Deleted

## 2023-03-19 DIAGNOSIS — I10 Essential (primary) hypertension: Secondary | ICD-10-CM | POA: Diagnosis not present

## 2023-03-19 DIAGNOSIS — H25013 Cortical age-related cataract, bilateral: Secondary | ICD-10-CM | POA: Diagnosis not present

## 2023-03-19 DIAGNOSIS — H2512 Age-related nuclear cataract, left eye: Secondary | ICD-10-CM | POA: Diagnosis not present

## 2023-03-19 DIAGNOSIS — H25043 Posterior subcapsular polar age-related cataract, bilateral: Secondary | ICD-10-CM | POA: Diagnosis not present

## 2023-03-19 DIAGNOSIS — H2513 Age-related nuclear cataract, bilateral: Secondary | ICD-10-CM | POA: Diagnosis not present

## 2023-03-19 NOTE — Telephone Encounter (Signed)
Patient Name: David Chang  DOB: [birth year] MRN: [id number]  Primary Cardiologist: Bryan Lemma, MD  Chart reviewed as part of pre-operative protocol coverage. Cataract extractions are recognized in guidelines as low risk surgeries that do not typically require specific preoperative testing or holding of blood thinner therapy. Therefore, given past medical history and time since last visit, based on ACC/AHA guidelines, [name] would be at acceptable risk for the planned procedure without further cardiovascular testing.   I will route this recommendation to the requesting party via Epic fax function and remove from pre-op pool.  Please call with questions.  Napoleon Form, Leodis Rains, NP 03/19/2023, 1:30 PM

## 2023-03-19 NOTE — Telephone Encounter (Signed)
Pre-operative Risk Assessment    Patient Name: David Chang  DOB: 02-08-1961 MRN: [id number]    DATE OF LAST VISIT: 09/27/22 DR. HARDING DATE OF NEXT VISIT: NONE  Request for Surgical Clearance    Procedure:   CATARACT EXTRACTION W/INTRAOCULAR LENS IMPLANT OF LEFT EYE TO THE BE DONE ON 05/29/23; FOLLOWED BY THE RIGHT EYE TO BE DONE 06/26/23  Date of Surgery:  Clearance 05/29/23                                 Surgeon:  DR. LISA SUN Surgeon's Group or Practice Name:  [facility] Phone number:  [phone] Fax number:  [phone]   Type of Clearance Requested:   - Medical ; PER CLEARANCE FORM: NO NEED TO STOP ANY MEDICATIONS, INCULDING BLOOD THINNERS   Type of Anesthesia:   TOPICAL ANESTHESIA WITH IV MEDICATION   Additional requests/questions:    Elpidio Anis   03/19/2023, 1:19 PM

## 2023-03-29 DIAGNOSIS — K219 Gastro-esophageal reflux disease without esophagitis: Secondary | ICD-10-CM | POA: Diagnosis not present

## 2023-03-29 DIAGNOSIS — I255 Ischemic cardiomyopathy: Secondary | ICD-10-CM | POA: Diagnosis not present

## 2023-03-29 DIAGNOSIS — I5042 Chronic combined systolic (congestive) and diastolic (congestive) heart failure: Secondary | ICD-10-CM | POA: Diagnosis not present

## 2023-03-29 DIAGNOSIS — N1831 Chronic kidney disease, stage 3a: Secondary | ICD-10-CM | POA: Diagnosis not present

## 2023-03-30 DIAGNOSIS — N1831 Chronic kidney disease, stage 3a: Secondary | ICD-10-CM | POA: Diagnosis not present

## 2023-03-30 DIAGNOSIS — E782 Mixed hyperlipidemia: Secondary | ICD-10-CM | POA: Diagnosis not present

## 2023-03-30 DIAGNOSIS — D751 Secondary polycythemia: Secondary | ICD-10-CM | POA: Diagnosis not present

## 2023-03-30 DIAGNOSIS — I25110 Atherosclerotic heart disease of native coronary artery with unstable angina pectoris: Secondary | ICD-10-CM | POA: Diagnosis not present

## 2023-04-11 DIAGNOSIS — G4733 Obstructive sleep apnea (adult) (pediatric): Secondary | ICD-10-CM | POA: Diagnosis not present

## 2023-04-27 ENCOUNTER — Other Ambulatory Visit: Payer: Self-pay | Admitting: *Deleted

## 2023-04-27 MED ORDER — CHLORTHALIDONE 25 MG PO TABS: ORAL_TABLET | ORAL | 3 refills | Status: AC

## 2023-05-01 ENCOUNTER — Other Ambulatory Visit: Payer: Self-pay | Admitting: Surgery

## 2023-05-01 DIAGNOSIS — I7121 Aneurysm of the ascending aorta, without rupture: Secondary | ICD-10-CM

## 2023-05-29 DIAGNOSIS — M1711 Unilateral primary osteoarthritis, right knee: Secondary | ICD-10-CM | POA: Diagnosis not present

## 2023-05-31 ENCOUNTER — Ambulatory Visit: Payer: BC Managed Care – PPO | Admitting: Physician Assistant

## 2023-05-31 ENCOUNTER — Encounter: Payer: Self-pay | Admitting: Physician Assistant

## 2023-05-31 ENCOUNTER — Ambulatory Visit
Admission: RE | Admit: 2023-05-31 | Discharge: 2023-05-31 | Disposition: A | Discharge status: Home / Self Care | Payer: BC Managed Care – PPO | Source: Ambulatory Visit | Attending: Surgery | Admitting: Surgery

## 2023-05-31 VITALS — BP 134/85 | HR 85 | Resp 20 | Ht 72.0 in | Wt 219.0 lb

## 2023-05-31 DIAGNOSIS — I7121 Aneurysm of the ascending aorta, without rupture: Secondary | ICD-10-CM

## 2023-05-31 DIAGNOSIS — I700 Atherosclerosis of aorta: Secondary | ICD-10-CM | POA: Diagnosis not present

## 2023-05-31 MED ORDER — IOPAMIDOL (ISOVUE-370) INJECTION 76%
500.0000 mL | Freq: Once | INTRAVENOUS | Status: AC | PRN
  Administered 2023-05-31: 75 mL via INTRAVENOUS

## 2023-05-31 NOTE — Progress Notes (Signed)
 Expand All Collapse All       [address]       [place] [address]             [phone]                             David Chang [id number] 1960/11/27   History of Present Illness:   David Chang is a 63 yo male with history of HTN, HLD, CAD status post multiple PCI's, Ischemic Cardiomyopathy.  He was referred to us  by Dr. Victory Sharps in December 2023 when he was discovered to have a 4.6 cm thoracic aortic aneurysm on routine echo.  The dimensions were confirmed on CTA he has patent followed by our practice every 6 months since then.  Overall patient is doing well.  He denies chest pain and shortness of breath.  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  He takes his blood pressure medications as prescribed.         Current Outpatient Medications  Medication Sig Dispense Refill   amLODipine  (NORVASC ) 10 MG tablet TAKE 1 TABLET BY MOUTH ONCE DAILY 90 tablet 2   chlorthalidone  (HYGROTON ) 25 MG tablet TAKE 1/2 TABLET(12.5 MG) BY MOUTH DAILY 45 tablet 3   clopidogrel  (PLAVIX ) 75 MG tablet TAKE ONE TABLET BY MOUTH DAILY 90 tablet 1   Glycerin-Hypromellose-PEG 400 (DRY EYE RELIEF DROPS) 0.2-0.2-1 % SOLN Place 1 drop into both eyes daily as needed (Dry eye).     losartan  (COZAAR ) 50 MG tablet Take 1 tablet (50 mg total) by mouth daily. 90 tablet 3   metoprolol  (TOPROL -XL) 200 MG 24 hr tablet TAKE 1 TABLET(200 MG) BY MOUTH DAILY 90 tablet 3   milk thistle 175 MG tablet Take 175 mg by mouth daily.     nitroGLYCERIN  (NITROSTAT ) 0.4 MG SL tablet Place 1 tablet (0.4 mg total) under the tongue every 5 (five) minutes as needed for chest pain. 25 tablet 4   Omega-3 Fatty Acids (FISH OIL) 1000 MG CAPS Take 1,000 mg by mouth daily.     pantoprazole  (PROTONIX ) 20 MG tablet Take 20 mg by mouth daily as needed for heartburn or indigestion.     rosuvastatin  (CRESTOR ) 20 MG tablet Take 1 tablet (20 mg total) by mouth  daily. 90 tablet 3   traZODone  (DESYREL ) 50 MG tablet Take 50-100 mg by mouth at bedtime as needed for sleep.     No current facility-administered medications for this visit.    Physical Exam:  Vital signs: BP 134/85 Pulse 85 Respirations 20 SpO2 95% on room air  General: 63 year old male in no distress.  He is accompanied by his wife today. Heart: Regular rate and rhythm, soft systolic murmur Chest: Breath sounds are clear to auscultation Extremities: Well-perfused, no peripheral edema  Diagnostic Tests: Narrative & Impression  CLINICAL DATA:  Thoracic aortic aneurysm.   EXAM: CT ANGIOGRAPHY CHEST WITH CONTRAST   TECHNIQUE: Multidetector CT imaging of the chest was performed using the standard protocol during bolus administration of intravenous contrast. Multiplanar CT image reconstructions and MIPs were obtained to evaluate the vascular anatomy.   RADIATION DOSE REDUCTION: This exam was performed according to the departmental dose-optimization program which includes automated exposure control, adjustment of the mA and/or kV according to patient size and/or use of iterative reconstruction technique.   CONTRAST:  75mL ISOVUE -370 IOPAMIDOL  (ISOVUE -370) INJECTION 76%  COMPARISON:  Chest CT dated 11/20/2022.   FINDINGS: Cardiovascular: There is no cardiomegaly or pericardial effusion. Three-vessel coronary vascular calcification. Similar appearance of dilatation of the ascending aorta measuring up to 4.6 cm. No aortic dissection. There is mild atherosclerotic calcification of the thoracic aorta. The origins of the great vessels of the aortic arch and the central pulmonary arteries appear patent.   Mediastinum/Nodes: No hilar or mediastinal adenopathy. The esophagus is grossly unremarkable. No mediastinal fluid collection.   Lungs/Pleura: Minimal bibasilar dependent atelectasis. No focal consolidation, pleural effusion, or pneumothorax. The central airways are  patent.   Upper Abdomen: No acute abnormality.   Musculoskeletal: Degenerative changes of the spine. No acute osseous pathology.   Review of the MIP images confirms the above findings.   IMPRESSION: 1. Similar appearance of dilatation of the ascending aorta measuring up to 4.6 cm. Ascending thoracic aortic aneurysm. Recommend semi-annual imaging followup by CTA or MRA and referral to cardiothoracic surgery if not already obtained. This recommendation follows 2010 ACCF/AHA/AATS/ACR/ASA/SCA/SCAI/SIR/STS/SVM Guidelines for the Diagnosis and Management of Patients With Thoracic Aortic Disease. Circulation. 2010; 121: Z733-z630. Aortic aneurysm NOS (ICD10-I71.9) 2. No acute intrathoracic pathology. 3.  Aortic Atherosclerosis (ICD10-I70.0).     Electronically Signed   By: David Chang M.D.   On: 05/31/2023 13:55      Impression / Plan: Stable 4.6 cm thoracic aortic aneurysm this is a 63 year old male the above described past medical history.  We again reviewed the importance of ongoing surveillance and careful management of his blood pressure and cholesterol.  He has tailored his exercise routine to avoid strenuous exercises but continues to remain active physically.  Follow-up in 6 months with CTA chest.   David JUDITHANN Becket, PA-C [facility] [phone]

## 2023-05-31 NOTE — Patient Instructions (Signed)
 Continue careful management of your blood pressure and cholesterol.  Continue to avoid strenuous repetitive activities.  Follow-up in 6 months with CTA chest

## 2023-06-27 DIAGNOSIS — B351 Tinea unguium: Secondary | ICD-10-CM | POA: Diagnosis not present

## 2023-07-05 ENCOUNTER — Other Ambulatory Visit: Payer: Self-pay | Admitting: Cardiology

## 2023-10-01 DIAGNOSIS — N1831 Chronic kidney disease, stage 3a: Secondary | ICD-10-CM | POA: Diagnosis not present

## 2023-10-01 DIAGNOSIS — D696 Thrombocytopenia, unspecified: Secondary | ICD-10-CM | POA: Diagnosis not present

## 2023-10-01 DIAGNOSIS — E782 Mixed hyperlipidemia: Secondary | ICD-10-CM | POA: Diagnosis not present

## 2023-10-01 DIAGNOSIS — Z136 Encounter for screening for cardiovascular disorders: Secondary | ICD-10-CM | POA: Diagnosis not present

## 2023-10-01 DIAGNOSIS — I130 Hypertensive heart and chronic kidney disease with heart failure and stage 1 through stage 4 chronic kidney disease, or unspecified chronic kidney disease: Secondary | ICD-10-CM | POA: Diagnosis not present

## 2023-10-01 DIAGNOSIS — Z125 Encounter for screening for malignant neoplasm of prostate: Secondary | ICD-10-CM | POA: Diagnosis not present

## 2023-10-01 DIAGNOSIS — D751 Secondary polycythemia: Secondary | ICD-10-CM | POA: Diagnosis not present

## 2023-10-01 DIAGNOSIS — R7303 Prediabetes: Secondary | ICD-10-CM | POA: Diagnosis not present

## 2023-10-01 DIAGNOSIS — I10 Essential (primary) hypertension: Secondary | ICD-10-CM | POA: Diagnosis not present

## 2023-10-01 DIAGNOSIS — I5032 Chronic diastolic (congestive) heart failure: Secondary | ICD-10-CM | POA: Diagnosis not present

## 2023-10-01 DIAGNOSIS — Z0000 Encounter for general adult medical examination without abnormal findings: Secondary | ICD-10-CM | POA: Diagnosis not present

## 2023-10-02 DIAGNOSIS — M1711 Unilateral primary osteoarthritis, right knee: Secondary | ICD-10-CM | POA: Diagnosis not present

## 2023-10-08 DIAGNOSIS — M778 Other enthesopathies, not elsewhere classified: Secondary | ICD-10-CM | POA: Diagnosis not present

## 2023-11-26 ENCOUNTER — Other Ambulatory Visit: Payer: Self-pay | Admitting: Surgery

## 2023-11-26 DIAGNOSIS — I7121 Aneurysm of the ascending aorta, without rupture: Secondary | ICD-10-CM

## 2023-12-17 ENCOUNTER — Ambulatory Visit (HOSPITAL_COMMUNITY)
Admission: RE | Admit: 2023-12-17 | Discharge: 2023-12-17 | Disposition: A | Discharge status: Home / Self Care | Source: Ambulatory Visit | Attending: Surgery | Admitting: Surgery

## 2023-12-17 DIAGNOSIS — I700 Atherosclerosis of aorta: Secondary | ICD-10-CM | POA: Insufficient documentation

## 2023-12-17 DIAGNOSIS — I517 Cardiomegaly: Secondary | ICD-10-CM | POA: Diagnosis not present

## 2023-12-17 DIAGNOSIS — I2510 Atherosclerotic heart disease of native coronary artery without angina pectoris: Secondary | ICD-10-CM | POA: Insufficient documentation

## 2023-12-17 DIAGNOSIS — I7121 Aneurysm of the ascending aorta, without rupture: Secondary | ICD-10-CM | POA: Insufficient documentation

## 2023-12-17 MED ORDER — IOHEXOL 350 MG/ML SOLN
75.0000 mL | Freq: Once | INTRAVENOUS | Status: AC | PRN
  Administered 2023-12-17: 75 mL via INTRAVENOUS

## 2023-12-17 NOTE — Progress Notes (Signed)
 Patient presented for a CT scan with contrast. During the study, the IV extravasated resulting in about 30 ml of IV contrast/saline in patient's right arm in the antecubital area.  Patient reports some pain to area and some swelling was noted.  Patient has a +2 radial pulse distal to injection site along with full range of motion and normal sensation.  Dr. Kate called and made aware. He recommended that we apply a pressure dressing and a cool compress on injection. Patient is safe to discharge with instructions on when to seek medical attention.  Verbal and written instructions given to patient about care and the patient verbalized understanding.

## 2023-12-18 ENCOUNTER — Telehealth (HOSPITAL_COMMUNITY): Payer: Self-pay | Admitting: *Deleted

## 2023-12-18 NOTE — Telephone Encounter (Signed)
 Attempted to call patient to follow up on his arm following yesterday's CT appointment. Left message on voicemail with name and callback number  Chantal Requena RN Navigator Cardiac Imaging [facility] [phone] Office [phone] Cell

## 2023-12-18 NOTE — Telephone Encounter (Signed)
 Patient's spouse returning call. She states his arm has improved since yesterday. She said the swelling has come down but there is now a bruise. I re-assured her that that was normal. She was appreciative for the follow up call.  Chantal Requena RN Navigator Cardiac Imaging [facility] [phone] Office [phone] Cell

## 2023-12-24 ENCOUNTER — Ambulatory Visit

## 2023-12-24 VITALS — BP 158/100 | HR 89 | Resp 18 | Ht 72.0 in | Wt 223.0 lb

## 2023-12-24 DIAGNOSIS — I7121 Aneurysm of the ascending aorta, without rupture: Secondary | ICD-10-CM | POA: Diagnosis not present

## 2023-12-24 NOTE — Progress Notes (Signed)
 [address] [place] [address]             [phone]            EROS MONTOUR [id number] 1961-01-17   History of Present Illness:  [name] is a 63 year old male with medical history of hypertension, NSTEMI, CAD, osteoarthritis of knee, hyperlipidemia, and GERD who presents for continued follow-up of ascending thoracic aortic aneurysm.  He has been followed since 2023 and aneurysm has consistently measured 4.6 cm.    His blood pressure is well-controlled with current medications.  He reports home readings consistently that are 130s/80s or less.  His blood pressure is elevated at today's visit but he reports that he is very stressed with work.  He is also upset because he has a elbow injury and needs a knee replacement and has not been able to work out like normal.  He usually tries to lead a very active life with cardiovascular exercise and lifting weights but has been unable to do so over the past 6 weeks due to injury.  After being diagnosed with aneurysm he reports that he does not lift heavy weights and focuses on higher rep counts.     Current Outpatient Medications on File Prior to Visit  Medication Sig Dispense Refill   amLODipine  (NORVASC ) 10 MG tablet TAKE 1 TABLET BY MOUTH ONCE DAILY 90 tablet 2   chlorthalidone  (HYGROTON ) 25 MG tablet TAKE 1/2 TABLET(12.5 MG) BY MOUTH DAILY 45 tablet 3   clopidogrel  (PLAVIX ) 75 MG tablet TAKE ONE TABLET BY MOUTH DAILY 90 tablet 1   Glycerin-Hypromellose-PEG 400 (DRY EYE RELIEF DROPS) 0.2-0.2-1 % SOLN Place 1 drop into both eyes daily as needed (Dry eye).     losartan  (COZAAR ) 50 MG tablet TAKE 1 TABLET(50 MG) BY MOUTH DAILY 90 tablet 3   metoprolol  (TOPROL -XL) 200 MG 24 hr tablet TAKE 1 TABLET(200 MG) BY MOUTH DAILY 90 tablet 3   milk thistle 175 MG tablet Take 175 mg by mouth daily.     nitroGLYCERIN  (NITROSTAT ) 0.4 MG SL tablet Place 1 tablet (0.4 mg total) under the tongue every 5 (five)  minutes as needed for chest pain. 25 tablet 4   Omega-3 Fatty Acids (FISH OIL) 1000 MG CAPS Take 1,000 mg by mouth daily.     pantoprazole  (PROTONIX ) 20 MG tablet Take 20 mg by mouth daily as needed for heartburn or indigestion.     rosuvastatin  (CRESTOR ) 20 MG tablet Take 1 tablet (20 mg total) by mouth daily. 90 tablet 3   traZODone  (DESYREL ) 50 MG tablet Take 50-100 mg by mouth at bedtime as needed for sleep.     No current facility-administered medications on file prior to visit.      Review of Systems  Respiratory: Negative.  Negative for cough and shortness of breath.   Cardiovascular: Negative.  Negative for chest pain and leg swelling.  Musculoskeletal:  Positive for joint pain.     BP (!) 158/100   Pulse 89   Resp 18   Ht 6' (1.829 m)   Wt 223 lb (101.2 kg)   SpO2 95%   BMI 30.24 kg/m   Physical Exam Constitutional:      Appearance: Normal appearance.  HENT:     Head: Normocephalic and atraumatic.  Cardiovascular:     Rate and Rhythm: Normal rate and regular rhythm.     Heart sounds: Murmur heard.  Systolic murmur is present.  Pulmonary:     Effort: Pulmonary effort is normal.     Breath sounds: Normal breath sounds.  Skin:    General: Skin is warm and dry.  Neurological:     General: No focal deficit present.     Mental Status: He is alert and oriented to person, place, and time.      Imaging: CLINICAL DATA:  Aortic aneurysm   EXAM: CT ANGIOGRAPHY CHEST WITH CONTRAST   TECHNIQUE: Multidetector CT imaging of the chest was performed using the standard protocol during bolus administration of intravenous contrast. Multiplanar CT image reconstructions and MIPs were obtained to evaluate the vascular anatomy.   RADIATION DOSE REDUCTION: This exam was performed according to the departmental dose-optimization program which includes automated exposure control, adjustment of the mA and/or kV according to patient size and/or use of iterative  reconstruction technique.   CONTRAST:  75mL OMNIPAQUE  IOHEXOL  350 MG/ML SOLN   COMPARISON:  CT angiogram May 31, 2023   FINDINGS: Cardiovascular: The heart size is enlarged. Aneurysmal dilation of ascending aorta measuring 4.6 cm, stable. Pulmonary artery measures 3 cm. Coronary stents in place. Atherosclerotic calcifications of coronary arteries, aorta and aortic annulus. No pericardial fluid.   Mediastinum/Nodes: No enlarged mediastinal, hilar, or axillary lymph nodes. Thyroid  gland, trachea, and esophagus demonstrate no significant findings.   Lungs/Pleura: No suspicious pulmonary nodule. Scattered calcified pulmonary micro nodules are stable to prior. No pleural effusion.   Upper Abdomen: Multiple hypodense liver lesions. Hypodense cortical lesion measuring 9 mm in left kidney posterior cortex which does not require imaging follow-up. Contrast within the collecting system. Hiatal hernia.   Musculoskeletal: Degenerative changes of the spine.   Review of the MIP images confirms the above findings.   IMPRESSION: No new finding to suggest pulmonary embolism or suspicious pulmonary nodule.   Stable appearance of aneurysmal dilation of ascending aorta measuring 4.6 cm. Follow-up in 6 months to ensure stability.   Pulmonary artery is slightly enlarged, stable.     Electronically Signed   By: Megan  Zare M.D.   On: 12/17/2023 16:26     A/P: Aneurysm of ascending aorta without rupture (HCC) - 4.6 cm ascending thoracic aortic aneurysm measured on CTA of chest.  Echocardiogram in 2016 showed normal aortic valve structure.  Echocardiogram in 2023 was unable to visualize aortic valve.  Discussed that if aneurysm were to increase in size would consider getting another echo.  We discussed the natural history and and risk factors for growth of ascending aortic aneurysms. Discussed recommendations to minimize the risk of further expansion or dissection including careful blood  pressure control, avoidance of contact sports and heavy lifting, attention to lipid management.  We covered the importance of staying never user of tobacco.  The patient does not yet meet surgical criteria of >5.5cm. The patient is aware of signs and symptoms of aortic dissection and when to present to the emergency department    -Follow up in 6 months with CTA of chest     Risk Modification:  Statin:  rosuvastatin  20 mg  Smoking cessation instruction/counseling given:  never smoker  Patient was counseled on importance of Blood Pressure Control  They are instructed to contact their Primary Care Physician if they start to have blood pressure readings over 130s/90s. Do not ever stop blood pressure medications on your own, unless instructed by healthcare professional.  Please avoid use of Fluoroquinolones as this can potentially increase your risk of Aortic Rupture and/or Dissection  Patient  educated on signs and symptoms of Aortic Dissection, handout also provided in AVS  Manuelita CHRISTELLA Rough, PA-C 12/24/23

## 2023-12-24 NOTE — Patient Instructions (Signed)

## 2024-01-01 ENCOUNTER — Telehealth: Payer: Self-pay

## 2024-01-01 NOTE — Telephone Encounter (Signed)
   Pre-operative Risk Assessment    Patient Name: David Chang  DOB: February 17, 1961 MRN: [id number]   Date of last office visit: 09/27/22 ALM CLAY, MD Date of next office visit: NONE   Request for Surgical Clearance    Procedure:  OPEN LEFT TRICEPS TENDON REPAIR  Date of Surgery:  Clearance TBD                                Surgeon:  REYES BOER, MD Surgeon's Group or Practice Name:  [facility] Phone number:  [phone] Fax number:  [phone]   Type of Clearance Requested:   - Medical  - Pharmacy:  Hold Clopidogrel  (Plavix )     Type of Anesthesia:  General    Additional requests/questions:    SignedLucie DELENA Ku   01/01/2024, 12:35 PM

## 2024-01-01 NOTE — Telephone Encounter (Signed)
   Name: David Chang  DOB: [birth year]  MRN: [id number]  Primary Cardiologist: Alm Clay, MD Last OV 09/27/22  Chart reviewed as part of pre-operative protocol coverage. Because of Ethin Drummond Morocho's past medical history and time since last visit, he will require a follow-up in-office visit in order to better assess preoperative cardiovascular risk.  Pre-op covering staff: - Please schedule appointment and call patient to inform them. If patient already had an upcoming appointment within acceptable timeframe, please add pre-op clearance to the appointment notes so provider is aware. - Please contact requesting surgeon's office via preferred method (i.e, phone, fax) to inform them of need for appointment prior to surgery.  Per Dr. Genice notes,Okay to hold Plavix  5 to 7 days preop for surgeries or procedures.  [place] D Solangel Mcmanaway, NP  01/01/2024, 12:40 PM

## 2024-01-03 NOTE — Telephone Encounter (Signed)
 Caller Gennie) wants to know if patient can stop his Aspirin  prior to his surgery.  Caller stated they want to schedule patient's surgery on Monday, 8/18.

## 2024-01-03 NOTE — Telephone Encounter (Signed)
 Reena from DR. Larnell office called back. Per Reena states she was unaware that Dr. Larnell gave the pt a date for surgery. Reena also stated that they were unaware the pt is on Plavix  and not ASA.   Reena states she will d/w further with Dr. Larnell, however they may just need to wait until the pt has been seen by cardiology and cleared. While speaking with Reena I did place the pt on a wait list as well.   I will send notes as FYI at this time.

## 2024-01-03 NOTE — Telephone Encounter (Signed)
 Left message for surgery scheduler David Chang to call our office back in regards to her call about ASA hold.    Left message that we do not see the pt is not on ASA. Cardiology med list reflects Plavix , not ASA.   Also, call today states the surgery is planned now for 01/07/24, however, the request was presented to our office as TBD. Pt also needs appt in office and has appt 01/16/24 with Orren Fabry, PAC.    I will also fax these notes to the requesting office as FYI.

## 2024-01-15 NOTE — Progress Notes (Deleted)
 Cardiology Office Note   Date:  01/15/2024  ID:  David Chang, DOB 1960-12-14, MRN [id number] PCP: Jackolyn Darice BROCKS, FNP  [facility] Providers Cardiologist:  Alm Clay, MD   History of Present Illness David Chang is a 63 y.o. male with a past medical history of coronary artery disease status post NSTEMI with history of stent placement to mid LAD DES, proximal RCA DES, mid LCx DES (04/08/2015) remaining on lifelong dual antiplatelet, thoracic aortic aneurysm, hypertension, hyperlipidemia, chronic combined systolic and diastolic heart failure here for follow-up and preop clearance.  He was seen in January 25, 2022 by Dr. Claudene.  No complaints and was happy with his current level of activity.  Denies dyspnea or angina. Exercise.  Discontinue aspirin  and continue Plavix  at that time.  Ordered echocardiogram and continued on Crestor  20 mg with LDL of 71.  CKD 3B with creatinine 1.8.  Consider stopping pirfenidone and adding SGLT2 and plan to follow-up with Dr. Brzozowski in a year.  Last seen see CTS for thoracic aortic aneurysm.  No chest pain or dyspnea at that time.  Walking regularly both cardiovascularly and with heavy weightlifting.  Somewhat stressful job.  The last time he presented to see Dr. Clay he was doing well from a cardiovascular standpoint.  He had a lot of questions about his thoracic aorta.  Very active using lighter weights and cardiovascular exercise.  Also very busy with work.  Denies any active cardiovascular symptoms such as chest pain or pressure or dyspnea.  No heart failure symptoms.  No arrhythmias or rapid heartbeats, palpitations.  No syncope or syncope, TIA.  No claudication.  Today, he ***   ROS: ***  Studies Reviewed      Cardiac Cath-PCI 04/08/2015: Severe three-vessel CAD: mLAD 90% (Promus Premier DES 3.5 x 16-3.8 mm); mLCx 95% (Promus Premier DES 3.0 x 16 - 3.4 mm); pRCA 90% (Promus Premier DES 4.0 x 20 -> 4.3 mm), mRCA 45%. Cardiac  Cath 05/07/2020: mLCx 100% CTO (OM 3 fills via right to left collaterals, widely patent mid LAD stent with moderate diffuse disease throughout the LAD.  Dominant RCA with shepherd's crook takeoff, proximal stent patent followed by segmental 60% mid vessel stenosis and then a focal 85% stenosis in the  distal vessel just beyond the bend. => Staged PCI.  EF was 55% with EDP of 13 mmHg. Cardiac Cath-PCI 06/15/2020: Mid-distal RCA PCI- Resolute Onyx DES 3.5 x 34 (postdilated with 3.75 and 4.0 Mantador balloons. Diagnostic Dominance: Right                                                             Intervention  TTE 02/06/2022: EF~55%.  Normal wall motion.  GR 1 DD.  Normal RV.  Moderate LA dilation consistent with diastolic function.  Trivial MR.  Aortic valve sclerosis with no stenosis. CTA Chest-Aorta 03/30/2022: Aneurysmal disease of the ascending thoracic aorta measuring up to 4.6 cm in greatest diameter.  Associated calcified aortic valve.  Recommend semiannual follow-up with CTA or MRA.   Risk Assessment/Calculations {Does this patient have ATRIAL FIBRILLATION?:[phone]} No BP recorded.  {Refresh Note OR Click here to enter BP  :1}***       Physical Exam VS:  There were no vitals taken for this visit.  Wt Readings from Last 3 Encounters:  12/24/23 223 lb (101.2 kg)  05/31/23 219 lb (99.3 kg)  11/20/22 219 lb (99.3 kg)    GEN: Well nourished, well developed in no acute distress NECK: No JVD; No carotid bruits CARDIAC: ***RRR, no murmurs, rubs, gallops RESPIRATORY:  Clear to auscultation without rales, wheezing or rhonchi  ABDOMEN: Soft, non-tender, non-distended EXTREMITIES:  No edema; No deformity   ASSESSMENT AND PLAN Preop Clearance  {Click Here to Calculate RCRI      :[id number]}  { Click Here to Calculate DASI      :789639253} {Select to add RCRI Risk (<1%=LOW; >/=1%=HIGH) (Optional):21036017}  {Select if HIGH (RCRI >/=1%) Risk (Optional):21036030} Recommendations: {2014 ACC/AHA  Perioperative Guidelines  :21036001} Antiplatelet and/or Anticoagulation Recommendations: {Antiplatelet Recommendations                  :21036016} {Anticoagulation Recommendations           :78963980}  Thoracic aortic aneurysm NSTEMI (chronic)  Ischemic cardiomyopathy/chronic combined systolic and diastolic CHF Hypertension Hyperlipidemia LDL goal less than 70 CAD status post PCI    {Are you ordering a CV Procedure (e.g. stress test, cath, DCCV, TEE, etc)?   Press F2        :789639268}  Dispo: ***  Signed, Orren LOISE Fabry, PA-C

## 2024-01-16 ENCOUNTER — Ambulatory Visit: Admitting: Physician Assistant

## 2024-02-24 ENCOUNTER — Encounter (HOSPITAL_BASED_OUTPATIENT_CLINIC_OR_DEPARTMENT_OTHER): Payer: Self-pay | Admitting: Emergency Medicine

## 2024-02-24 ENCOUNTER — Emergency Department (HOSPITAL_BASED_OUTPATIENT_CLINIC_OR_DEPARTMENT_OTHER)
Admission: EM | Admit: 2024-02-24 | Discharge: 2024-02-24 | Disposition: A | Discharge status: Home / Self Care | Attending: Emergency Medicine | Admitting: Emergency Medicine

## 2024-02-24 ENCOUNTER — Emergency Department (HOSPITAL_BASED_OUTPATIENT_CLINIC_OR_DEPARTMENT_OTHER)

## 2024-02-24 DIAGNOSIS — X509XXA Other and unspecified overexertion or strenuous movements or postures, initial encounter: Secondary | ICD-10-CM | POA: Insufficient documentation

## 2024-02-24 DIAGNOSIS — Z7902 Long term (current) use of antithrombotics/antiplatelets: Secondary | ICD-10-CM | POA: Diagnosis not present

## 2024-02-24 DIAGNOSIS — R0781 Pleurodynia: Secondary | ICD-10-CM | POA: Diagnosis present

## 2024-02-24 DIAGNOSIS — R0789 Other chest pain: Secondary | ICD-10-CM

## 2024-02-24 MED ORDER — IBUPROFEN 400 MG PO TABS
600.0000 mg | ORAL_TABLET | Freq: Once | ORAL | Status: AC
  Administered 2024-02-24: 600 mg via ORAL
  Filled 2024-02-24: qty 1

## 2024-02-24 MED ORDER — IBUPROFEN 600 MG PO TABS: 600.0000 mg | ORAL_TABLET | Freq: Three times a day (TID) | ORAL | 0 refills | Status: AC | PRN

## 2024-02-24 MED ORDER — OXYCODONE HCL 5 MG PO TABS: 5.0000 mg | ORAL_TABLET | Freq: Three times a day (TID) | ORAL | 0 refills | Status: AC | PRN

## 2024-02-24 MED ORDER — OXYCODONE-ACETAMINOPHEN 5-325 MG PO TABS
1.0000 | ORAL_TABLET | Freq: Once | ORAL | Status: AC
  Administered 2024-02-24: 1 via ORAL
  Filled 2024-02-24: qty 1

## 2024-02-24 NOTE — ED Notes (Signed)
 ED Provider at bedside.

## 2024-02-24 NOTE — Discharge Instructions (Addendum)
 I recommend that you use your incentive spirometer at home, taking 10 slow breaths and had a time, 10 times a day for the next 7 days.  This is an exercise to help prevent you from getting pneumonia.  Do not wrap or bind your chest.  Please call your doctors office to schedule follow-up appointment in 7 days to reassess your symptoms.  You can take Tylenol , over-the-counter, as well as the ibuprofen I prescribed for basic pain.  If you have more severe pain, I did prescribe you oxycodone , which is an opioid narcotic.  Please be aware that long-term use of opioids has been linked to dependency and addiction.  Uses a sparingly as possible.  Do not mix Oxycodone  with alcohol  or any sedating medicine.

## 2024-02-24 NOTE — ED Triage Notes (Signed)
 Pt sts he was at a ball game yesterday and was yelling intensely when he felt a pop on the LT rib area; c/o pain in certain positions since then

## 2024-02-24 NOTE — ED Notes (Signed)

## 2024-02-24 NOTE — ED Provider Notes (Signed)
 [facility] Provider Note   CSN: [id number] Arrival date & time: 02/24/24  1154     Patient presents with: Rib Injury   David Chang is a 63 y.o. male with a of aortic aneurysm, presented to the ED with complaint of left-sided rib pain.  Patient reports he was at a sporting event and he was yelling yesterday when he felt a pop in the left side of his ribs.  He is not having positional pain since then.  It is worse with deep inspiration.  He took Tylenol  and over-the-counter ibuprofen earlier   HPI     Prior to Admission medications   Medication Sig Start Date End Date Taking? Authorizing Provider  ibuprofen (ADVIL) 600 MG tablet Take 1 tablet (600 mg total) by mouth every 8 (eight) hours as needed for up to 21 doses for mild pain (pain score 1-3) or moderate pain (pain score 4-6). 02/24/24  Yes Fionna Merriott, Donnice PARAS, MD  oxyCODONE  (ROXICODONE ) 5 MG immediate release tablet Take 1 tablet (5 mg total) by mouth every 8 (eight) hours as needed for up to 10 doses for severe pain (pain score 7-10). 02/24/24  Yes Cottie Donnice PARAS, MD  amLODipine  (NORVASC ) 10 MG tablet TAKE 1 TABLET BY MOUTH ONCE DAILY 12/29/16   Claudene Victory ORN, MD  chlorthalidone  (HYGROTON ) 25 MG tablet TAKE 1/2 TABLET(12.5 MG) BY MOUTH DAILY 04/27/23   Anner Alm ORN, MD  clopidogrel  (PLAVIX ) 75 MG tablet TAKE ONE TABLET BY MOUTH DAILY 05/07/17   Claudene Victory ORN, MD  Glycerin-Hypromellose-PEG 400 (DRY EYE RELIEF DROPS) 0.2-0.2-1 % SOLN Place 1 drop into both eyes daily as needed (Dry eye).    [provider]  losartan  (COZAAR ) 50 MG tablet TAKE 1 TABLET(50 MG) BY MOUTH DAILY 07/05/23   Anner Alm ORN, MD  metoprolol  (TOPROL -XL) 200 MG 24 hr tablet TAKE 1 TABLET(200 MG) BY MOUTH DAILY 04/17/22   Claudene Victory ORN, MD  milk thistle 175 MG tablet Take 175 mg by mouth daily.    [provider]  nitroGLYCERIN  (NITROSTAT ) 0.4 MG SL tablet Place 1 tablet (0.4 mg total)  under the tongue every 5 (five) minutes as needed for chest pain. 05/04/20   Claudene Victory ORN, MD  Omega-3 Fatty Acids (FISH OIL) 1000 MG CAPS Take 1,000 mg by mouth daily.    [provider]  pantoprazole  (PROTONIX ) 20 MG tablet Take 20 mg by mouth daily as needed for heartburn or indigestion. 03/08/20   [provider]  rosuvastatin  (CRESTOR ) 20 MG tablet Take 1 tablet (20 mg total) by mouth daily. 01/15/23   Anner Alm ORN, MD  traZODone  (DESYREL ) 50 MG tablet Take 50-100 mg by mouth at bedtime as needed for sleep.    [provider]    Allergies: Tramadol     Review of Systems  Updated Vital Signs BP (!) 130/97 (BP Location: Right Arm)   Pulse 94   Temp 98 F (36.7 C) (Oral)   Resp 20   Ht 6' (1.829 m)   Wt 97.5 kg   SpO2 94%   BMI 29.16 kg/m   Physical Exam Constitutional:      General: He is not in acute distress. HENT:     Head: Normocephalic and atraumatic.  Eyes:     Conjunctiva/sclera: Conjunctivae normal.     Pupils: Pupils are equal, round, and reactive to light.  Cardiovascular:     Rate and Rhythm: Normal rate and regular rhythm.  Pulmonary:     Effort: Pulmonary effort is normal. No respiratory distress.  Abdominal:     General: There is no distension.     Tenderness: There is no abdominal tenderness.  Musculoskeletal:     Comments: Anterior lower midline rib tenderness on exam  Skin:    General: Skin is warm and dry.  Neurological:     General: No focal deficit present.     Mental Status: He is alert. Mental status is at baseline.  Psychiatric:        Mood and Affect: Mood normal.        Behavior: Behavior normal.     (all labs ordered are listed, but only abnormal results are displayed) Labs Reviewed - No data to display  EKG: None  Radiology: No results found.   Procedures   Medications Ordered in the ED  oxyCODONE -acetaminophen  (PERCOCET/ROXICET) 5-325 MG per tablet 1 tablet (has no administration in time  range)  ibuprofen (ADVIL) tablet 600 mg (has no administration in time range)                                    Medical Decision Making Amount and/or Complexity of Data Reviewed Radiology: ordered.  Risk Prescription drug management.   Patient is here with chest wall pain and reproducible anterior midline rib tenderness.  This is suggestive of a likely underlying rib fracture.  X-ray does not show any evident underlying pneumothorax.  Low suspicion for aortic dissection.  Patient is clinically quite well-appearing.  Doubt pneumonia.  We will give him some oral pain medications.  He has an incentive spirometer at home and I discussed its use for the next 7 days.  We discussed not binding his chest.  His wife is also present at the bedside for this discussion.  Will discharge with some pain medicine.     Final diagnoses:  Rib pain    ED Discharge Orders          Ordered    oxyCODONE  (ROXICODONE ) 5 MG immediate release tablet  Every 8 hours PRN        02/24/24 1237    ibuprofen (ADVIL) 600 MG tablet  Every 8 hours PRN        02/24/24 1237               Cottie Donnice PARAS, MD 02/24/24 1238

## 2024-03-06 ENCOUNTER — Telehealth: Payer: Self-pay

## 2024-03-06 NOTE — Telephone Encounter (Signed)
   Pre-operative Risk Assessment    Patient Name: David Chang  DOB: [birth year] MRN: [id number]   Date of last office visit: 09/27/22 Date of next office visit: 03/19/24   Request for Surgical Clearance    Procedure:  Right total knee arthroplasty   Date of Surgery:  Clearance TBD                                 Surgeon:  Reyes Boer, MD  Surgeon's Group or Practice Name:  [facility]  Phone number:  [phone] Fax number:  [phone]   Type of Clearance Requested:   - Medical  - Pharmacy:  Hold Clopidogrel  (Plavix ) Not indicated    Type of Anesthesia:  General    Additional requests/questions:    Bonney Rebeca Blight   03/06/2024, 2:05 PM

## 2024-03-07 NOTE — Telephone Encounter (Signed)
   Name: David Chang  DOB: 04-08-1961  MRN: [id number]  Primary Cardiologist: Alm Clay, MD  Chart reviewed as part of pre-operative protocol coverage. The patient has an upcoming visit scheduled with Dr. Clay on 03/19/2024 at which time clearance can be addressed in case there are any issues that would impact surgical recommendations.  Right total knee arthroplasty Is not scheduled until TBD as below. I added preop FYI to appointment note so that provider is aware to address at time of outpatient visit.  Per office protocol the cardiology provider should forward their finalized clearance decision and recommendations regarding antiplatelet therapy to the requesting party below.    I will route this message as FYI to requesting party and remove this message from the preop box as separate preop APP input not needed at this time.   Please call with any questions.  [name], NP  03/07/2024, 11:01 AM

## 2024-03-11 ENCOUNTER — Telehealth: Payer: Self-pay | Admitting: Cardiology

## 2024-03-11 NOTE — Telephone Encounter (Signed)
 Pt returning call to reschedule 10/29 appt with Anner, but says next available 05/27/24 is too far for his surgery. Please advise.

## 2024-03-11 NOTE — Telephone Encounter (Signed)
 I called the pt back and left message to call back that he can see APP for the preop clearance and if he wants to see Dr. Anner in 05/2024 that can be scheduled as well for his f/u appt.   Left message that surgeon office stated surgery TBD not scheduled yet. In notes from operator pt states 05/2024 too late for surgery. We will need to know when the surgery has been planned as well.      Neysa Mort routed this conversation to [employer] (Selected Message) Neysa Mort [facility]   03/11/24  8:44 AM Note Pt returning call to reschedule 10/29 appt with Anner, but says next available 05/27/24 is too far for his surgery. Please advise.        03/11/24  8:43 AM Aisha Lonell BRAVO contacted Neysa Mort

## 2024-03-13 NOTE — Telephone Encounter (Signed)
 Pt scheduled sooner appt 03/19/24 David Chang.

## 2024-03-14 DIAGNOSIS — I440 Atrioventricular block, first degree: Secondary | ICD-10-CM | POA: Diagnosis not present

## 2024-03-14 NOTE — Progress Notes (Signed)
 Cardiology Office Note   Date:  03/18/2024  ID:  David Chang, DOB 05/24/1960, MRN [id number] PCP: Jackolyn Darice BROCKS, FNP  [facility] Providers Cardiologist:  Alm Clay, MD    History of Present Illness David Chang is a 62 y.o. male with past medical history of CAD, thoracic aortic aneurysm, ischemic cardiomyopathy, hyperlipidemia.  Patient followed by Dr. Clay, presents today for an 28-month follow-up appointment and for preoperative evaluation.  Patient previously had a left heart catheterization 03/2015.  Found to have severe three-vessel CAD.  Underwent successful 3 vessel PCI. Had DES placed to mid LAD, to proximal RCA, DES placed to mid circumflex.  Echocardiogram at that time showed EF 45-50% with grade 1 diastolic dysfunction, mild AI, mild MR.  Patient later had an abnormal nuclear stress test in 04/2020.  Underwent cardiac catheterization 05/07/2020 that showed total occlusion of the mid circumflex stent.  Mid LAD stent widely patent, but there was moderate diffuse disease throughout the LAD.  The RCA was dominant and there was significant angulation in the mid to distal segment followed by 85% focal stenosis.  Underwent successful stenting of the mid to distal RCA.  Echocardiogram 01/2022 showed EF 55% with no regional wall motion abnormalities, grade 1 diastolic dysfunction, normal RV systolic function.  There was moderate dilatation of the aortic root measuring 42 mm and severe dilatation of the ascending aorta measuring 46 mm.  His thoracic aortic aneurysm has been followed by CT surgery.  CTA in 11/2023 showed stable appearance of aneurysmal dilation of ascending aorta measuring 4.6 cm.  Following up in 6 months to ensure stability.  Today, patient present for preoperative evaluation prior to knee surgery.  He tells me that he has had several orthopedic surgeries in the past.  He has always tolerated surgery well.  He notes that his physical activity is limited  by his knee pain.  He is able to climb up and down stairs, walk around his home, go grocery shopping without any chest pain or shortness of breath.  Prior to having issues with his knees, he was doing 30 minutes of cardio per day as well as weight lifting.  He is very eager to have his knee surgery completed so that he can get back to exercising as usual.  He denies chest pain, shortness of breath, palpitations, dizziness, syncope, near syncope, lower extremity swelling.  He has been compliant with his cardiac medications.  He had labs drawn at [facility] for his surgery, lab results are not available for review in epic but he was told that everything looked normal/stable.  He also had an EKG.  This is scanned into the chart and showed Normal sinus rhythm with heart rate 71 bpm, first-degree AV block.  When compared to EKG from 09/2022, no changes noted.  Of note, patient has 2/6 systolic murmur on exam.  Patient tells me that Dr. Claudene had diagnosed him with a heart murmur about 10 years ago.   Studies Reviewed Cardiac Studies & Procedures   ______________________________________________________________________________________________ CARDIAC CATHETERIZATION  CARDIAC CATHETERIZATION 06/15/2020  Conclusion  Successful stenting of the mid to distal RCA that included the acute bend in the mid RCA using a 34 x 3.5 mm Onyx postdilated to 4.0 mm in diameter.  85% stenosis was reduced to 0% with TIMI grade III flow.  A guide catheter extension was needed to be able to deliver the stent due to a shepherd's crook margin.  TIMI grade III flow was noted.  RECOMMENDATIONS:   Aspirin  and Plavix  for 1 year.  Then discontinue aspirin  and continue Plavix  as before.  Overnight stay due to 7 French sheath which would increase the risk of post procedure bleeding.  Findings Coronary Findings Diagnostic  Dominance: Right  Left Main Vessel is large. Very large Vessel is angiographically  normal.  Left Anterior Descending Vessel is large. There is moderate diffuse disease throughout the vessel. The vessel continues as a moderate caliber vessel down around the apex perfusing the distal third of the inferoapex. Non-stenotic Mid LAD lesion was previously treated. The lesion is segmental and eccentric.  First Diagonal Branch Vessel is large in size. Vessel is angiographically normal.  Second Diagonal Branch Vessel is small in size.  Second Septal Branch Vessel is small in size.  Third Diagonal Branch Vessel is small in size.  Third Septal Branch Vessel is small in size.  Left Circumflex Mid Cx lesion is 100% stenosed. The lesion is located at the major branch, discrete, eccentric and ulcerative. The lesion was previously treated.  First Obtuse Marginal Branch Vessel is moderate in size. The vessel exhibits minimal luminal irregularities.  Second Obtuse Marginal Branch Vessel is small in size.  Third Obtuse Marginal Branch Collaterals 3rd Mrg filled by collaterals from 2nd RPL.  Lateral Third Obtuse Marginal Branch Vessel is moderate in size.  Right Coronary Artery Vessel is large. Very large Non-stenotic Prox RCA lesion was previously treated. The lesion is located at the bend, tubular, eccentric and ulcerative. Prox RCA to Mid RCA lesion is 60% stenosed. Mid RCA to Dist RCA lesion is 80% stenosed.  Acute Marginal Branch Vessel is moderate in size.  Right Posterior Descending Artery Vessel is moderate in size. Vessel is angiographically normal.  Right Posterior Atrioventricular Artery Vessel is normal and moderate in size.  First Right Posterolateral Branch Vessel is small in size.  Second Right Posterolateral Branch Vessel is small in size.  Third Right Posterolateral Branch Vessel is small in size.  Intervention  Prox RCA to Mid RCA lesion Stent (Also treats lesions: Mid RCA to Dist RCA) A stent was successfully placed. Post-Intervention  Lesion Assessment The intervention was successful. There is a 20% residual stenosis post intervention.  Mid RCA to Dist RCA lesion Stent (Also treats lesions: Prox RCA to Mid RCA) See details in Prox RCA to Mid RCA lesion. Post-Intervention Lesion Assessment The intervention was successful. There is a 0% residual stenosis post intervention.   CARDIAC CATHETERIZATION  CARDIAC CATHETERIZATION 05/07/2020  Conclusion  Total occlusion of the mid circumflex stent  Widely patent mid LAD stent.  Moderate diffuse disease throughout the LAD.  Widely patent left main.  Dominant right coronary with shepherd's crook, and significant angulation in the mid to distal segment followed by an 85% focal stenosis.  Unable to achieve coaxial support in the right coronary with an extra-support guide catheter.  Because of CKD, we decided to perform PCI as a stand-alone procedure from the right femoral approach at some point in the next 2 weeks.  The EF is 50% and EDP is 13 mmHg.  RECOMMENDATIONS:   Plan high risk PCI via right femoral approach using extra-support guide catheter such as Amplatz 1 cm or 2 cm catheter and may need a guide liner to give best opportunity to that deliver the distal stent.  Continue same medication.  Return if angina at rest.  Stay below anginal threshold.  My office will arrange the repeat procedure.  Findings Coronary Findings Diagnostic  Dominance: Right  Left Main Vessel is large. Very large Vessel is angiographically normal.  Left Anterior Descending Vessel is large. There is moderate diffuse disease throughout the vessel. The vessel continues as a moderate caliber vessel down around the apex perfusing the distal third of the inferoapex. Previously placed Mid LAD drug eluting stent is widely patent. The lesion is segmental and eccentric.  First Diagonal Branch Vessel is large in size. Vessel is angiographically normal.  Second Diagonal Branch Vessel is  small in size.  Second Septal Branch Vessel is small in size.  Third Diagonal Branch Vessel is small in size.  Third Septal Branch Vessel is small in size.  Left Circumflex Mid Cx lesion is 100% stenosed. The lesion is located at the major branch, discrete, eccentric and ulcerative. The lesion was previously treated.  First Obtuse Marginal Branch Vessel is moderate in size. The vessel exhibits minimal luminal irregularities.  Second Obtuse Marginal Branch Vessel is small in size.  Third Obtuse Marginal Branch Collaterals 3rd Mrg filled by collaterals from 2nd RPL.  Lateral Third Obtuse Marginal Branch Vessel is moderate in size.  Right Coronary Artery Vessel is large. Very large Previously placed Prox RCA drug eluting stent is widely patent. The lesion is located at the bend, tubular, eccentric and ulcerative. Prox RCA to Mid RCA lesion is 60% stenosed. Mid RCA to Dist RCA lesion is 80% stenosed.  Acute Marginal Branch Vessel is moderate in size.  Right Posterior Descending Artery Vessel is moderate in size. Vessel is angiographically normal.  Right Posterior Atrioventricular Artery Vessel is normal and moderate in size.  First Right Posterolateral Branch Vessel is small in size.  Second Right Posterolateral Branch Vessel is small in size.  Third Right Posterolateral Branch Vessel is small in size.  Intervention  No interventions have been documented.   STRESS TESTS  MYOCARDIAL PERFUSION IMAGING 04/29/2020  Interpretation Summary  Nuclear stress EF: 53%.  There was no ST segment deviation noted during stress.  Findings consistent with prior myocardial infarction with peri-infarct ischemia.  This is an intermediate risk study.  The left ventricular ejection fraction is mildly decreased (45-54%).  There is a medium size partially reversible defect in the inferior and inferolateral walls from base to apex.  At the apex, the inferior wall defect  appears moderate and fixed. At the mid ventricle, the inferior wall defect is moderate and fixed and there is a moderate stress induced perfusion defect in the inferolateral wall that has complete reversibility at rest. At the base, the inferior and inferolateral segments have moderate perfusion defect with partial reversibility to mild at rest. Findings consistent with prior infarct with peri-infarct ischemia.  No ischemia noted on stress ECG.  Intermediate risk study.   ECHOCARDIOGRAM  ECHOCARDIOGRAM COMPLETE 02/06/2022  Narrative ECHOCARDIOGRAM REPORT    Patient Name:   David Chang Date of Exam: 02/06/2022 Medical Rec #:  [id number]        Height:       72.0 in Accession #:    [id number]       Weight:       227.6 lb Date of Birth:  Mar 14, 1961        BSA:          2.251 m Patient Age:    61 years         BP:           134/90 mmHg Patient Gender: M  HR:           74 bpm. Exam Location:  [address]  Procedure: 2D Echo, 3D Echo, Cardiac Doppler, Color Doppler and Strain Analysis  Indications:    R01.1 Murmur  History:        Patient has prior history of Echocardiogram examinations, most recent 04/08/2015.  Sonographer:    Marshia Lawyer BS, RDCS Referring Phys: [phone] [facility]N [facility]  IMPRESSIONS   1. My measurement of septum 14 mm . Left ventricular ejection fraction, by estimation, is 55%. The left ventricle has normal function. The left ventricle has no regional wall motion abnormalities. Left ventricular diastolic parameters are consistent with Grade I diastolic dysfunction (impaired relaxation). The average left ventricular global longitudinal strain is -23.3 %. The global longitudinal strain is normal. 2. Right ventricular systolic function is normal. The right ventricular size is normal. 3. Left atrial size was moderately dilated. 4. The mitral valve is normal in structure. Trivial mitral valve regurgitation. No evidence of mitral stenosis. 5. Cannot tell  how many leaflets some calcium  but no AS by AVA mean gradient 12 mmHg . The aortic valve was not well visualized. Aortic valve regurgitation is not visualized. No aortic stenosis is present. 6. Aortic dilatation noted. There is moderate dilatation of the aortic root, measuring 42 mm. There is severe dilatation of the ascending aorta, measuring 46 mm. 7. The inferior vena cava is normal in size with greater than 50% respiratory variability, suggesting right atrial pressure of 3 mmHg.  Comparison(s): 04/08/15 EF 45-50%.  FINDINGS Left Ventricle: My measurement of septum 14 mm. Left ventricular ejection fraction, by estimation, is 55%. The left ventricle has normal function. The left ventricle has no regional wall motion abnormalities. The average left ventricular global longitudinal strain is -23.3 %. The global longitudinal strain is normal. The left ventricular internal cavity size was normal in size. There is no left ventricular hypertrophy. Left ventricular diastolic parameters are consistent with Grade I diastolic dysfunction (impaired relaxation).  Right Ventricle: The right ventricular size is normal. No increase in right ventricular wall thickness. Right ventricular systolic function is normal.  Left Atrium: Left atrial size was moderately dilated.  Right Atrium: Right atrial size was normal in size.  Pericardium: There is no evidence of pericardial effusion.  Mitral Valve: The mitral valve is normal in structure. Trivial mitral valve regurgitation. No evidence of mitral valve stenosis.  Tricuspid Valve: The tricuspid valve is normal in structure. Tricuspid valve regurgitation is mild . No evidence of tricuspid stenosis.  Aortic Valve: Cannot tell how many leaflets some calcium  but no AS by AVA mean gradient 12 mmHg. The aortic valve was not well visualized. Aortic valve regurgitation is not visualized. No aortic stenosis is present. Aortic valve mean gradient measures 12.5 mmHg. Aortic  valve peak gradient measures 23.6 mmHg. Aortic valve area, by VTI measures 2.07 cm.  Pulmonic Valve: The pulmonic valve was normal in structure. Pulmonic valve regurgitation is not visualized. No evidence of pulmonic stenosis.  Aorta: Aortic dilatation noted. There is moderate dilatation of the aortic root, measuring 42 mm. There is severe dilatation of the ascending aorta, measuring 46 mm.  Venous: The inferior vena cava is normal in size with greater than 50% respiratory variability, suggesting right atrial pressure of 3 mmHg.  IAS/Shunts: No atrial level shunt detected by color flow Doppler.   LEFT VENTRICLE PLAX 2D LVIDd:         3.10 cm   Diastology LVIDs:  3.20 cm   LV e' medial:    4.33 cm/s LV PW:         0.62 cm   LV E/e' medial:  9.8 LV IVS:        3.80 cm   LV e' lateral:   9.21 cm/s LVOT diam:     2.60 cm   LV E/e' lateral: 4.6 LV SV:         99 LV SV Index:   44        2D Longitudinal Strain LVOT Area:     5.31 cm  2D Strain GLS (A2C):   -18.9 % 2D Strain GLS (A3C):   -25.5 % 2D Strain GLS (A4C):   -25.6 % 2D Strain GLS Avg:     -23.3 %  3D Volume EF: 3D EF:        57 % LV EDV:       206 ml LV ESV:       89 ml LV SV:        117 ml  RIGHT VENTRICLE             IVC RV Basal diam:  4.50 cm     IVC diam: 1.50 cm RV S prime:     10.70 cm/s TAPSE (M-mode): 2.5 cm  LEFT ATRIUM             Index        RIGHT ATRIUM           Index LA diam:        4.80 cm 2.13 cm/m   RA Pressure: 3.00 mmHg LA Vol (A2C):   79.2 ml 35.19 ml/m  RA Area:     20.90 cm LA Vol (A4C):   84.3 ml 37.45 ml/m  RA Volume:   51.20 ml  22.75 ml/m LA Biplane Vol: 83.5 ml 37.10 ml/m AORTIC VALVE AV Area (Vmax):    2.13 cm AV Area (Vmean):   2.19 cm AV Area (VTI):     2.07 cm AV Vmax:           243.00 cm/s AV Vmean:          162.500 cm/s AV VTI:            0.480 m AV Peak Grad:      23.6 mmHg AV Mean Grad:      12.5 mmHg LVOT Vmax:         97.60 cm/s LVOT Vmean:        66.900  cm/s LVOT VTI:          0.187 m LVOT/AV VTI ratio: 0.39  AORTA Ao Root diam: 4.20 cm Ao Asc diam:  4.60 cm  MITRAL VALVE               TRICUSPID VALVE Estimated RAP:  3.00 mmHg MV Decel Time: 317 msec MV E velocity: 42.30 cm/s  SHUNTS MV A velocity: 99.00 cm/s  Systemic VTI:  0.19 m MV E/A ratio:  0.43        Systemic Diam: 2.60 cm  Maude Emmer MD Electronically signed by Maude Emmer MD Signature Date/Time: 02/06/2022/3:42:55 PM    Final          ______________________________________________________________________________________________      Risk Assessment/Calculations           Physical Exam VS:  BP 132/88   Pulse 76   Ht 6' (1.829 m)   Wt 213 lb 3.2 oz (96.7 kg)  SpO2 98%   BMI 28.92 kg/m        Wt Readings from Last 3 Encounters:  03/18/24 213 lb 3.2 oz (96.7 kg)  02/24/24 215 lb (97.5 kg)  12/24/23 223 lb (101.2 kg)    GEN: Well nourished, well developed in no acute distress. Sitting comfortably on the exam table  NECK: No JVD; No carotid bruits CARDIAC:  RRR. Grade 2/6 systolic murmur at RUSB  RESPIRATORY:  Clear to auscultation without rales, wheezing or rhonchi. Normal WOB on room air   ABDOMEN: Soft, non-tender, non-distended EXTREMITIES:  No edema in BLE; No deformity   ASSESSMENT AND PLAN  Preoperative evaluation - Patient pending right total knee replacement - While his physical activity has been limited by his joint pain, patient has not had any chest pain or dyspnea on exertion.  Able to complete greater than 4 METS physical activity without anginal symptoms - Recent E[facility] from [facility] on 03/14/2024 showed normal sinus rhythm with first-degree AV block, unchanged compared to EKG from 09/2022  - Patient denies chest pain, shortness of breath, dizziness, syncope, near syncope, palpitations, lower extremity swelling - Okay to proceed with surgery without further cardiac testing - Okay to hold Plavix  for 3-5 days prior to surgery.   Recommend resuming as soon adequate hemostasis achieved postsurgery  CAD - Previously had DES in the mid LAD, proximal RCA, mid circumflex in 2016.  In 2021, received DES to mid-distal RCA.  Mid circumflex flex stent had been occluded at that time - Patient denies chest pain or DOE - EKG from [facility] on 03/14/24 was unchanged compared to EKG from 09/2022  - Continue amlodipine  10 mg daily and metoprolol  succinate 200 mg daily - Continue Plavix  75 mg daily - Continue Crestor  20 mg daily  Cardiac murmur - Patient has 2/6 cardiac murmur noted on exam.  He tells me that Dr. Claudene had told him about this 10 years ago. - Echocardiogram from 01/2022 noted some calcium  on the aortic valve, could not tell how many leaflets.  There was no AS and mean gradient was 12 mmHg.  - Patient does not have any symptoms concerning for aortic stenosis or other valvular diseases.  No chest pain, shortness of breath, dizziness, syncope, near syncope - Ordered echocardiogram to better assess aortic valve as it was not well visualized on previous echp. Given his lack of symptoms and stable 2/6 murmur on exam, echocardiogram does not need to be completed prior to surgery   Thoracic aortic aneurysm - Followed by CT surgery.  CTA in 11/2023 showed stable appearance of aneurysmal dilatation of the ascending aorta measuring 4.6 cm  - CT surgery to repeat CT scan at 6 months  - BP well controlled. Reviewed avoiding fluoroquinolone antibiotics  Ischemic cardiomyopathy  Heart failure with improved EF - EF was previously 45-50% in 2016.  Most recent echo from 01/2022 showed EF 55% with no regional wall motion abnormalities, grade 1 DD, normal RV systolic function - Patient euvolemic on exam - Continue losartan  50 mg daily, metoprolol  succinate 200 mg daily  Hypertension - BP well-controlled.  No symptoms of orthostatic hypotension - Continue chlorthalidone  12.5 mg daily, losartan  50 mg daily, metoprolol  succinate  200 mg daily, amlodipine  10 mg daily  - Creatinine stable at 1.79, K 4.2 in 09/2023.   CKD stage IIIb - Creatinine 1.79, eGFR 42 in 09/2023 - Labs followed by primary care provider  Hyperlipidemia - Lipid panel in 09/2023 showed total cholesterol 114, triglycerides 148,  HDL 34, LDL 57 - Continue Crestor  20 mg daily   Dispo: Follow up with Dr. Anner in 6 months   Signed, Rollo FABIENE Louder, PA-C

## 2024-03-18 ENCOUNTER — Encounter: Payer: Self-pay | Admitting: Cardiology

## 2024-03-18 ENCOUNTER — Ambulatory Visit: Attending: Cardiology | Admitting: Cardiology

## 2024-03-18 VITALS — BP 132/88 | HR 76 | Ht 72.0 in | Wt 213.2 lb

## 2024-03-18 DIAGNOSIS — I10 Essential (primary) hypertension: Secondary | ICD-10-CM | POA: Diagnosis not present

## 2024-03-18 DIAGNOSIS — I255 Ischemic cardiomyopathy: Secondary | ICD-10-CM

## 2024-03-18 DIAGNOSIS — Z01810 Encounter for preprocedural cardiovascular examination: Secondary | ICD-10-CM | POA: Diagnosis not present

## 2024-03-18 DIAGNOSIS — I2510 Atherosclerotic heart disease of native coronary artery without angina pectoris: Secondary | ICD-10-CM

## 2024-03-18 DIAGNOSIS — R011 Cardiac murmur, unspecified: Secondary | ICD-10-CM | POA: Diagnosis not present

## 2024-03-18 DIAGNOSIS — E785 Hyperlipidemia, unspecified: Secondary | ICD-10-CM

## 2024-03-18 DIAGNOSIS — I77819 Aortic ectasia, unspecified site: Secondary | ICD-10-CM

## 2024-03-18 DIAGNOSIS — I7121 Aneurysm of the ascending aorta, without rupture: Secondary | ICD-10-CM

## 2024-03-18 NOTE — Patient Instructions (Signed)
 Medication Instructions:  Your physician recommends that you continue on your current medications as directed. Please refer to the Current Medication list given to you today.  *If you need a refill on your cardiac medications before your next appointment, please call your pharmacy*  Testing/Procedures: Your physician has requested that you have an echocardiogram. Echocardiography is a painless test that uses sound waves to create images of your heart. It provides your doctor with information about the size and shape of your heart and how well your heart's chambers and valves are working. This procedure takes approximately one hour. There are no restrictions for this procedure. Please do NOT wear cologne, perfume, aftershave, or lotions (deodorant is allowed). Please arrive 15 minutes prior to your appointment time.  Please note: We ask at that you not bring children with you during ultrasound (echo/ vascular) testing. Due to room size and safety concerns, children are not allowed in the ultrasound rooms during exams. Our front office staff cannot provide observation of children in our lobby area while testing is being conducted. An adult accompanying a patient to their appointment will only be allowed in the ultrasound room at the discretion of the ultrasound technician under special circumstances. We apologize for any inconvenience.  Follow-Up: At [facility], you and your health needs are our priority.  As part of our continuing mission to provide you with exceptional heart care, our providers are all part of one team.  This team includes your primary Cardiologist (physician) and Advanced Practice Providers or APPs (Physician Assistants and Nurse Practitioners) who all work together to provide you with the care you need, when you need it.  Your next appointment:   6 month(s)  Provider:   Alm Clay, MD  We recommend signing up for the patient portal called MyChart.  Sign up  information is provided on this After Visit Summary.  MyChart is used to connect with patients for Virtual Visits (Telemedicine).  Patients are able to view lab/test results, encounter notes, upcoming appointments, etc.  Non-urgent messages can be sent to your provider as well.    To learn more about what you can do with MyChart, go to [email or web address].

## 2024-03-19 ENCOUNTER — Ambulatory Visit: Admitting: Cardiology

## 2024-05-06 ENCOUNTER — Other Ambulatory Visit: Payer: Self-pay | Admitting: Cardiology

## 2024-05-07 ENCOUNTER — Ambulatory Visit: Payer: Self-pay | Admitting: Cardiology

## 2024-05-07 ENCOUNTER — Ambulatory Visit (HOSPITAL_COMMUNITY): Admission: RE | Admit: 2024-05-07 | Discharge: 2024-05-07 | Attending: Cardiology | Admitting: Cardiology

## 2024-05-07 DIAGNOSIS — R011 Cardiac murmur, unspecified: Secondary | ICD-10-CM

## 2024-05-07 LAB — ECHOCARDIOGRAM COMPLETE
AR max vel: 1.32 cm2
AV Area VTI: 1.41 cm2
AV Area mean vel: 1.28 cm2
AV Mean grad: 19.2 mmHg
AV Peak grad: 32.5 mmHg
Ao pk vel: 2.85 m/s
Area-P 1/2: 2.85 cm2
S' Lateral: 2.8 cm

## 2024-05-30 ENCOUNTER — Other Ambulatory Visit: Payer: Self-pay | Admitting: Surgery

## 2024-05-30 DIAGNOSIS — I7121 Aneurysm of the ascending aorta, without rupture: Secondary | ICD-10-CM

## 2024-05-30 NOTE — Progress Notes (Signed)
 cta

## 2024-06-18 ENCOUNTER — Ambulatory Visit (HOSPITAL_COMMUNITY)
Admission: RE | Admit: 2024-06-18 | Discharge: 2024-06-18 | Disposition: A | Discharge status: Home / Self Care | Source: Ambulatory Visit | Attending: Surgery | Admitting: Surgery

## 2024-06-18 DIAGNOSIS — I7121 Aneurysm of the ascending aorta, without rupture: Secondary | ICD-10-CM | POA: Diagnosis present

## 2024-06-18 MED ORDER — IOHEXOL 350 MG/ML SOLN
75.0000 mL | Freq: Once | INTRAVENOUS | Status: AC | PRN
  Administered 2024-06-18: 75 mL via INTRAVENOUS

## 2024-07-02 ENCOUNTER — Ambulatory Visit
